# Patient Record
Sex: Female | Born: 1975 | Race: Black or African American | Hispanic: No | Marital: Married | Smoking: Never smoker
Health system: Southern US, Community
[De-identification: ages and names within clinical notes are randomized; demographics above are authoritative.]

## PROBLEM LIST (undated history)

## (undated) DIAGNOSIS — N189 Chronic kidney disease, unspecified: Secondary | ICD-10-CM

## (undated) DIAGNOSIS — Z8679 Personal history of other diseases of the circulatory system: Secondary | ICD-10-CM

## (undated) DIAGNOSIS — R0602 Shortness of breath: Secondary | ICD-10-CM

## (undated) DIAGNOSIS — I341 Nonrheumatic mitral (valve) prolapse: Secondary | ICD-10-CM

## (undated) DIAGNOSIS — I10 Essential (primary) hypertension: Secondary | ICD-10-CM

## (undated) DIAGNOSIS — R51 Headache: Secondary | ICD-10-CM

## (undated) DIAGNOSIS — K449 Diaphragmatic hernia without obstruction or gangrene: Secondary | ICD-10-CM

## (undated) DIAGNOSIS — J45909 Unspecified asthma, uncomplicated: Secondary | ICD-10-CM

## (undated) HISTORY — PX: CHOLECYSTECTOMY: SHX55

## (undated) HISTORY — PX: TUBAL LIGATION: SHX77

---

## 2000-02-17 ENCOUNTER — Encounter: Admission: RE | Admit: 2000-02-17 | Discharge: 2000-02-17 | Payer: Self-pay | Admitting: Sports Medicine

## 2000-06-01 ENCOUNTER — Encounter: Admission: RE | Admit: 2000-06-01 | Discharge: 2000-06-01 | Payer: Self-pay | Admitting: Sports Medicine

## 2000-07-27 ENCOUNTER — Encounter: Admission: RE | Admit: 2000-07-27 | Discharge: 2000-07-27 | Payer: Self-pay | Admitting: Family Medicine

## 2000-11-15 ENCOUNTER — Encounter: Admission: RE | Admit: 2000-11-15 | Discharge: 2000-11-15 | Payer: Self-pay | Admitting: Family Medicine

## 2000-11-29 ENCOUNTER — Encounter: Admission: RE | Admit: 2000-11-29 | Discharge: 2000-11-29 | Payer: Self-pay | Admitting: Family Medicine

## 2001-03-01 ENCOUNTER — Encounter: Admission: RE | Admit: 2001-03-01 | Discharge: 2001-03-01 | Payer: Self-pay | Admitting: Sports Medicine

## 2001-07-12 ENCOUNTER — Encounter: Admission: RE | Admit: 2001-07-12 | Discharge: 2001-07-12 | Payer: Self-pay | Admitting: Sports Medicine

## 2001-11-30 ENCOUNTER — Encounter: Admission: RE | Admit: 2001-11-30 | Discharge: 2001-11-30 | Payer: Self-pay | Admitting: Family Medicine

## 2001-12-20 ENCOUNTER — Encounter: Admission: RE | Admit: 2001-12-20 | Discharge: 2001-12-20 | Payer: Self-pay | Admitting: Sports Medicine

## 2002-01-03 ENCOUNTER — Encounter: Admission: RE | Admit: 2002-01-03 | Discharge: 2002-01-03 | Payer: Self-pay | Admitting: Sports Medicine

## 2002-01-03 ENCOUNTER — Encounter (INDEPENDENT_AMBULATORY_CARE_PROVIDER_SITE_OTHER): Payer: Self-pay | Admitting: Specialist

## 2002-02-12 ENCOUNTER — Encounter: Admission: RE | Admit: 2002-02-12 | Discharge: 2002-02-12 | Payer: Self-pay | Admitting: Family Medicine

## 2002-02-14 ENCOUNTER — Encounter: Admission: RE | Admit: 2002-02-14 | Discharge: 2002-02-14 | Payer: Self-pay | Admitting: Sports Medicine

## 2002-03-27 ENCOUNTER — Encounter: Admission: RE | Admit: 2002-03-27 | Discharge: 2002-03-27 | Payer: Self-pay | Admitting: Family Medicine

## 2002-04-11 ENCOUNTER — Encounter: Admission: RE | Admit: 2002-04-11 | Discharge: 2002-04-11 | Payer: Self-pay | Admitting: Sports Medicine

## 2002-07-18 ENCOUNTER — Encounter: Admission: RE | Admit: 2002-07-18 | Discharge: 2002-07-18 | Payer: Self-pay | Admitting: Sports Medicine

## 2002-08-14 ENCOUNTER — Encounter: Admission: RE | Admit: 2002-08-14 | Discharge: 2002-08-14 | Payer: Self-pay | Admitting: Family Medicine

## 2002-10-08 ENCOUNTER — Encounter: Admission: RE | Admit: 2002-10-08 | Discharge: 2002-10-08 | Payer: Self-pay | Admitting: Family Medicine

## 2002-11-26 ENCOUNTER — Encounter: Admission: RE | Admit: 2002-11-26 | Discharge: 2002-11-26 | Payer: Self-pay | Admitting: Family Medicine

## 2002-12-19 ENCOUNTER — Encounter: Admission: RE | Admit: 2002-12-19 | Discharge: 2002-12-19 | Payer: Self-pay | Admitting: Sports Medicine

## 2003-04-09 ENCOUNTER — Encounter: Admission: RE | Admit: 2003-04-09 | Discharge: 2003-04-09 | Payer: Self-pay | Admitting: Family Medicine

## 2003-04-09 ENCOUNTER — Encounter (INDEPENDENT_AMBULATORY_CARE_PROVIDER_SITE_OTHER): Payer: Self-pay | Admitting: Family Medicine

## 2003-06-18 ENCOUNTER — Encounter: Admission: RE | Admit: 2003-06-18 | Discharge: 2003-06-18 | Payer: Self-pay | Admitting: Family Medicine

## 2003-07-02 ENCOUNTER — Encounter: Admission: RE | Admit: 2003-07-02 | Discharge: 2003-07-02 | Payer: Self-pay | Admitting: Family Medicine

## 2003-07-30 ENCOUNTER — Encounter: Admission: RE | Admit: 2003-07-30 | Discharge: 2003-07-30 | Payer: Self-pay | Admitting: Family Medicine

## 2003-07-31 ENCOUNTER — Encounter: Admission: RE | Admit: 2003-07-31 | Discharge: 2003-07-31 | Payer: Self-pay | Admitting: Sports Medicine

## 2003-11-12 ENCOUNTER — Encounter (INDEPENDENT_AMBULATORY_CARE_PROVIDER_SITE_OTHER): Payer: Self-pay | Admitting: *Deleted

## 2003-11-12 ENCOUNTER — Encounter: Admission: RE | Admit: 2003-11-12 | Discharge: 2003-11-12 | Payer: Self-pay | Admitting: Family Medicine

## 2004-04-07 ENCOUNTER — Ambulatory Visit: Payer: Self-pay | Admitting: Family Medicine

## 2004-07-06 ENCOUNTER — Ambulatory Visit: Payer: Self-pay | Admitting: Sports Medicine

## 2004-07-28 ENCOUNTER — Ambulatory Visit: Payer: Self-pay | Admitting: Family Medicine

## 2004-07-28 ENCOUNTER — Encounter (INDEPENDENT_AMBULATORY_CARE_PROVIDER_SITE_OTHER): Payer: Self-pay | Admitting: *Deleted

## 2004-11-02 ENCOUNTER — Ambulatory Visit: Payer: Self-pay | Admitting: Sports Medicine

## 2004-11-29 ENCOUNTER — Ambulatory Visit: Payer: Self-pay | Admitting: Family Medicine

## 2005-01-05 ENCOUNTER — Ambulatory Visit: Payer: Self-pay | Admitting: Family Medicine

## 2005-01-05 ENCOUNTER — Encounter (INDEPENDENT_AMBULATORY_CARE_PROVIDER_SITE_OTHER): Payer: Self-pay | Admitting: Family Medicine

## 2005-02-22 ENCOUNTER — Emergency Department (HOSPITAL_COMMUNITY): Admission: EM | Admit: 2005-02-22 | Discharge: 2005-02-22 | Payer: Self-pay | Admitting: Emergency Medicine

## 2005-03-02 ENCOUNTER — Ambulatory Visit: Payer: Self-pay | Admitting: Family Medicine

## 2005-06-01 ENCOUNTER — Ambulatory Visit: Payer: Self-pay | Admitting: Family Medicine

## 2005-08-30 ENCOUNTER — Ambulatory Visit: Payer: Self-pay | Admitting: Family Medicine

## 2005-09-07 ENCOUNTER — Ambulatory Visit: Payer: Self-pay | Admitting: Family Medicine

## 2005-09-08 ENCOUNTER — Encounter (INDEPENDENT_AMBULATORY_CARE_PROVIDER_SITE_OTHER): Payer: Self-pay | Admitting: Family Medicine

## 2005-09-12 ENCOUNTER — Encounter (INDEPENDENT_AMBULATORY_CARE_PROVIDER_SITE_OTHER): Payer: Self-pay | Admitting: *Deleted

## 2005-09-12 LAB — CONVERTED CEMR LAB

## 2005-10-19 ENCOUNTER — Ambulatory Visit: Payer: Self-pay | Admitting: Family Medicine

## 2005-11-28 ENCOUNTER — Ambulatory Visit: Payer: Self-pay | Admitting: Family Medicine

## 2005-12-21 ENCOUNTER — Ambulatory Visit: Payer: Self-pay | Admitting: Family Medicine

## 2006-01-05 ENCOUNTER — Encounter: Admission: RE | Admit: 2006-01-05 | Discharge: 2006-01-05 | Payer: Self-pay | Admitting: Sports Medicine

## 2006-02-01 ENCOUNTER — Ambulatory Visit: Payer: Self-pay | Admitting: Family Medicine

## 2006-07-07 ENCOUNTER — Encounter (INDEPENDENT_AMBULATORY_CARE_PROVIDER_SITE_OTHER): Payer: Self-pay | Admitting: *Deleted

## 2006-08-04 ENCOUNTER — Other Ambulatory Visit: Admission: RE | Admit: 2006-08-04 | Discharge: 2006-08-04 | Payer: Self-pay | Admitting: Obstetrics & Gynecology

## 2006-10-25 ENCOUNTER — Encounter: Payer: Self-pay | Admitting: Family Medicine

## 2006-12-29 ENCOUNTER — Encounter: Payer: Self-pay | Admitting: Family Medicine

## 2007-02-24 ENCOUNTER — Inpatient Hospital Stay (HOSPITAL_COMMUNITY): Admission: AD | Admit: 2007-02-24 | Discharge: 2007-02-25 | Payer: Self-pay | Admitting: Obstetrics and Gynecology

## 2007-03-24 ENCOUNTER — Inpatient Hospital Stay (HOSPITAL_COMMUNITY): Admission: AD | Admit: 2007-03-24 | Discharge: 2007-03-27 | Payer: Self-pay | Admitting: Obstetrics and Gynecology

## 2007-03-30 ENCOUNTER — Ambulatory Visit (HOSPITAL_COMMUNITY): Admission: RE | Admit: 2007-03-30 | Discharge: 2007-03-30 | Payer: Self-pay | Admitting: Obstetrics and Gynecology

## 2008-05-09 HISTORY — PX: AXILLARY LYMPH NODE BIOPSY: SHX5737

## 2009-02-17 ENCOUNTER — Encounter: Admission: RE | Admit: 2009-02-17 | Discharge: 2009-02-17 | Payer: Self-pay | Admitting: Surgery

## 2009-03-09 ENCOUNTER — Encounter (INDEPENDENT_AMBULATORY_CARE_PROVIDER_SITE_OTHER): Payer: Self-pay | Admitting: Surgery

## 2009-03-09 ENCOUNTER — Ambulatory Visit (HOSPITAL_COMMUNITY): Admission: RE | Admit: 2009-03-09 | Discharge: 2009-03-09 | Payer: Self-pay | Admitting: Surgery

## 2009-11-04 ENCOUNTER — Inpatient Hospital Stay (HOSPITAL_COMMUNITY): Admission: AD | Admit: 2009-11-04 | Discharge: 2009-11-04 | Payer: Self-pay | Admitting: Obstetrics and Gynecology

## 2010-01-20 ENCOUNTER — Inpatient Hospital Stay (HOSPITAL_COMMUNITY)
Admission: AD | Admit: 2010-01-20 | Discharge: 2010-01-23 | Payer: Self-pay | Source: Home / Self Care | Admitting: Obstetrics and Gynecology

## 2010-01-21 ENCOUNTER — Encounter (INDEPENDENT_AMBULATORY_CARE_PROVIDER_SITE_OTHER): Payer: Self-pay | Admitting: Obstetrics and Gynecology

## 2010-01-26 ENCOUNTER — Ambulatory Visit
Admission: RE | Admit: 2010-01-26 | Discharge: 2010-01-26 | Payer: Self-pay | Source: Home / Self Care | Admitting: Obstetrics and Gynecology

## 2010-02-04 ENCOUNTER — Ambulatory Visit
Admission: RE | Admit: 2010-02-04 | Discharge: 2010-02-04 | Payer: Self-pay | Source: Home / Self Care | Admitting: Obstetrics and Gynecology

## 2010-05-30 ENCOUNTER — Encounter: Payer: Self-pay | Admitting: Family Medicine

## 2010-05-30 ENCOUNTER — Encounter: Payer: Self-pay | Admitting: Sports Medicine

## 2010-07-22 LAB — COMPREHENSIVE METABOLIC PANEL
AST: 20 U/L (ref 0–37)
Albumin: 2.9 g/dL — ABNORMAL LOW (ref 3.5–5.2)
Albumin: 3.1 g/dL — ABNORMAL LOW (ref 3.5–5.2)
Alkaline Phosphatase: 196 U/L — ABNORMAL HIGH (ref 39–117)
BUN: 10 mg/dL (ref 6–23)
Chloride: 108 mEq/L (ref 96–112)
Creatinine, Ser: 1.16 mg/dL (ref 0.4–1.2)
GFR calc non Af Amer: 54 mL/min — ABNORMAL LOW (ref >60)
GFR calc non Af Amer: 49 mL/min — ABNORMAL LOW (ref >60)
Glucose, Bld: 82 mg/dL (ref 70–99)
Potassium: 3.6 mEq/L (ref 3.5–5.1)
Sodium: 134 mEq/L — ABNORMAL LOW (ref 135–145)
Total Protein: 5.8 g/dL — ABNORMAL LOW (ref 6.0–8.3)
Total Protein: 5.9 g/dL — ABNORMAL LOW (ref 6.0–8.3)

## 2010-07-22 LAB — RPR: RPR Ser Ql: NONREACTIVE

## 2010-07-22 LAB — CBC
Hemoglobin: 8 g/dL — ABNORMAL LOW (ref 12.0–15.0)
Hemoglobin: 9.4 g/dL — ABNORMAL LOW (ref 12.0–15.0)
MCH: 31.6 pg (ref 26.0–34.0)
MCH: 32.2 pg (ref 26.0–34.0)
MCH: 31.5 pg (ref 26.0–34.0)
MCHC: 33.9 g/dL (ref 30.0–36.0)
MCV: 94.1 fL (ref 78.0–100.0)
MCV: 92.8 fL (ref 78.0–100.0)
Platelets: 194 10*3/uL (ref 150–400)
Platelets: 221 10*3/uL (ref 150–400)
RBC: 2.94 MIL/uL — ABNORMAL LOW (ref 3.87–5.11)
RDW: 18.5 % — ABNORMAL HIGH (ref 11.5–15.5)
RDW: 18.5 % — ABNORMAL HIGH (ref 11.5–15.5)
WBC: 6 10*3/uL (ref 4.0–10.5)

## 2010-07-22 LAB — LACTATE DEHYDROGENASE
LDH: 147 U/L (ref 94–250)
LDH: 305 U/L — ABNORMAL HIGH (ref 94–250)

## 2010-07-22 LAB — URIC ACID: Uric Acid, Serum: 8.7 mg/dL — ABNORMAL HIGH (ref 2.4–7.0)

## 2010-07-25 LAB — LACTATE DEHYDROGENASE: LDH: 119 U/L (ref 94–250)

## 2010-07-25 LAB — COMPREHENSIVE METABOLIC PANEL
AST: 22 U/L (ref 0–37)
Albumin: 3.1 g/dL — ABNORMAL LOW (ref 3.5–5.2)
CO2: 24 mEq/L (ref 19–32)
Chloride: 106 mEq/L (ref 96–112)
GFR calc Af Amer: >60 mL/min (ref >60)
GFR calc non Af Amer: >60 mL/min (ref >60)
Glucose, Bld: 82 mg/dL (ref 70–99)

## 2010-07-25 LAB — CBC
Hemoglobin: 9.1 g/dL — ABNORMAL LOW (ref 12.0–15.0)
MCHC: 34 g/dL (ref 30.0–36.0)
MCV: 91.4 fL (ref 78.0–100.0)

## 2010-07-25 LAB — URINALYSIS, ROUTINE W REFLEX MICROSCOPIC
Glucose, UA: 100 mg/dL — AB
Leukocytes, UA: NEGATIVE
Specific Gravity, Urine: >1.03 — ABNORMAL HIGH (ref 1.005–1.030)
Urobilinogen, UA: 1 mg/dL (ref 0.0–1.0)
pH: 6 (ref 5.0–8.0)

## 2010-07-25 LAB — URINE MICROSCOPIC-ADD ON

## 2010-08-12 LAB — CBC
HCT: 36.1 % (ref 36.0–46.0)
MCHC: 34.4 g/dL (ref 30.0–36.0)
MCV: 90.5 fL (ref 78.0–100.0)
RBC: 3.99 MIL/uL (ref 3.87–5.11)
WBC: 5.7 10*3/uL (ref 4.0–10.5)

## 2010-08-12 LAB — BASIC METABOLIC PANEL
BUN: 26 mg/dL — ABNORMAL HIGH (ref 6–23)
CO2: 25 mEq/L (ref 19–32)
Chloride: 107 mEq/L (ref 96–112)
Creatinine, Ser: 1.35 mg/dL — ABNORMAL HIGH (ref 0.4–1.2)
Potassium: 4 mEq/L (ref 3.5–5.1)

## 2010-08-12 LAB — DIFFERENTIAL
Basophils Relative: 1 % (ref 0–1)
Eosinophils Absolute: 0.1 10*3/uL (ref 0.0–0.7)
Eosinophils Relative: 2 % (ref 0–5)
Lymphs Abs: 1.6 10*3/uL (ref 0.7–4.0)
Monocytes Relative: 7 % (ref 3–12)
Neutrophils Relative %: 63 % (ref 43–77)

## 2011-02-15 LAB — CBC
HCT: 27.7 — ABNORMAL LOW
Hemoglobin: 8.4 — ABNORMAL LOW
MCHC: 34.2
MCHC: 35.9
MCV: 94.5
Platelets: 186
Platelets: 193
RDW: 14.4
RDW: 14.6
WBC: 7.4

## 2011-02-15 LAB — RPR: RPR Ser Ql: NONREACTIVE

## 2011-02-16 LAB — COMPREHENSIVE METABOLIC PANEL
ALT: 22
AST: 31
Alkaline Phosphatase: 105
CO2: 19
Calcium: 9.3
Chloride: 105
GFR calc Af Amer: >60
GFR calc non Af Amer: >60
Glucose, Bld: 81
Potassium: 3.6
Sodium: 134 — ABNORMAL LOW

## 2011-02-16 LAB — CREATININE CLEARANCE, URINE, 24 HOUR
Creatinine Clearance: 108
Creatinine, 24H Ur: 1513
Creatinine: 0.97
Urine Total Volume-CRCL: 1950

## 2011-02-16 LAB — CBC
Hemoglobin: 10.5 — ABNORMAL LOW
MCHC: 34.6
RBC: 3.21 — ABNORMAL LOW
WBC: 11.3 — ABNORMAL HIGH

## 2011-02-16 LAB — PROTEIN, URINE, 24 HOUR: Collection Interval-UPROT: 24

## 2011-02-16 LAB — LACTATE DEHYDROGENASE: LDH: 160

## 2012-01-28 ENCOUNTER — Ambulatory Visit (HOSPITAL_COMMUNITY)
Admission: RE | Admit: 2012-01-28 | Discharge: 2012-01-28 | Disposition: A | Discharge status: Home / Self Care | Source: Ambulatory Visit | Attending: *Deleted | Admitting: *Deleted

## 2012-01-28 ENCOUNTER — Encounter (HOSPITAL_COMMUNITY): Payer: Self-pay

## 2012-01-28 ENCOUNTER — Other Ambulatory Visit (HOSPITAL_COMMUNITY): Payer: Self-pay | Admitting: *Deleted

## 2012-01-28 DIAGNOSIS — R0602 Shortness of breath: Secondary | ICD-10-CM | POA: Insufficient documentation

## 2012-01-28 DIAGNOSIS — R7989 Other specified abnormal findings of blood chemistry: Secondary | ICD-10-CM

## 2012-01-28 DIAGNOSIS — R079 Chest pain, unspecified: Secondary | ICD-10-CM | POA: Insufficient documentation

## 2012-01-28 LAB — CREATININE, SERUM
Creatinine, Ser: 1.36 mg/dL — ABNORMAL HIGH (ref 0.50–1.10)
GFR calc Af Amer: 58 mL/min — ABNORMAL LOW (ref >90)

## 2012-01-28 LAB — BUN: BUN: 29 mg/dL — ABNORMAL HIGH (ref 6–23)

## 2012-01-28 MED ORDER — IOHEXOL 350 MG/ML SOLN
100.0000 mL | Freq: Once | INTRAVENOUS | Status: AC | PRN
  Administered 2012-01-28: 100 mL via INTRAVENOUS

## 2012-08-30 ENCOUNTER — Emergency Department (HOSPITAL_COMMUNITY): Payer: BC Managed Care – PPO

## 2012-08-30 ENCOUNTER — Emergency Department (HOSPITAL_COMMUNITY)
Admission: EM | Admit: 2012-08-30 | Discharge: 2012-08-30 | Disposition: A | Discharge status: Home / Self Care | Payer: BC Managed Care – PPO | Attending: Emergency Medicine | Admitting: Emergency Medicine

## 2012-08-30 ENCOUNTER — Encounter (HOSPITAL_COMMUNITY): Payer: Self-pay | Admitting: Emergency Medicine

## 2012-08-30 DIAGNOSIS — I10 Essential (primary) hypertension: Secondary | ICD-10-CM | POA: Insufficient documentation

## 2012-08-30 DIAGNOSIS — Z8639 Personal history of other endocrine, nutritional and metabolic disease: Secondary | ICD-10-CM | POA: Insufficient documentation

## 2012-08-30 DIAGNOSIS — Z862 Personal history of diseases of the blood and blood-forming organs and certain disorders involving the immune mechanism: Secondary | ICD-10-CM | POA: Insufficient documentation

## 2012-08-30 DIAGNOSIS — J45909 Unspecified asthma, uncomplicated: Secondary | ICD-10-CM | POA: Insufficient documentation

## 2012-08-30 DIAGNOSIS — R1013 Epigastric pain: Secondary | ICD-10-CM | POA: Insufficient documentation

## 2012-08-30 HISTORY — DX: Essential (primary) hypertension: I10

## 2012-08-30 HISTORY — DX: Unspecified asthma, uncomplicated: J45.909

## 2012-08-30 HISTORY — DX: Unspecified porphyria: E80.20

## 2012-08-30 LAB — COMPREHENSIVE METABOLIC PANEL
ALT: 15 U/L (ref 0–35)
AST: 21 U/L (ref 0–37)
Albumin: 4 g/dL (ref 3.5–5.2)
Alkaline Phosphatase: 40 U/L (ref 39–117)
BUN: 24 mg/dL — ABNORMAL HIGH (ref 6–23)
CO2: 24 mEq/L (ref 19–32)
Calcium: 9.8 mg/dL (ref 8.4–10.5)
Chloride: 103 mEq/L (ref 96–112)
Creatinine, Ser: 1.33 mg/dL — ABNORMAL HIGH (ref 0.50–1.10)
GFR calc Af Amer: 59 mL/min — ABNORMAL LOW (ref >90)
GFR calc non Af Amer: 51 mL/min — ABNORMAL LOW (ref >90)
Glucose, Bld: 84 mg/dL (ref 70–99)
Potassium: 3.8 mEq/L (ref 3.5–5.1)
Sodium: 136 mEq/L (ref 135–145)
Total Bilirubin: 0.4 mg/dL (ref 0.3–1.2)
Total Protein: 7.5 g/dL (ref 6.0–8.3)

## 2012-08-30 LAB — CBC
HCT: 33.1 % — ABNORMAL LOW (ref 36.0–46.0)
Hemoglobin: 10.9 g/dL — ABNORMAL LOW (ref 12.0–15.0)
MCH: 28.1 pg (ref 26.0–34.0)
MCHC: 32.9 g/dL (ref 30.0–36.0)
MCV: 85.3 fL (ref 78.0–100.0)
Platelets: 260 10*3/uL (ref 150–400)
RBC: 3.88 MIL/uL (ref 3.87–5.11)
RDW: 14.1 % (ref 11.5–15.5)
WBC: 7.4 10*3/uL (ref 4.0–10.5)

## 2012-08-30 LAB — URINALYSIS, ROUTINE W REFLEX MICROSCOPIC
Bilirubin Urine: NEGATIVE
Glucose, UA: NEGATIVE mg/dL
Hgb urine dipstick: NEGATIVE
Ketones, ur: NEGATIVE mg/dL
Leukocytes, UA: NEGATIVE
Nitrite: NEGATIVE
Protein, ur: NEGATIVE mg/dL
Specific Gravity, Urine: 1.015 (ref 1.005–1.030)
Urobilinogen, UA: 0.2 mg/dL (ref 0.0–1.0)
pH: 6 (ref 5.0–8.0)

## 2012-08-30 LAB — LIPASE, BLOOD: Lipase: 51 U/L (ref 11–59)

## 2012-08-30 MED ORDER — FAMOTIDINE 20 MG PO TABS: 20.0000 mg | ORAL_TABLET | Freq: Two times a day (BID) | ORAL | Status: DC

## 2012-08-30 MED ORDER — SODIUM CHLORIDE 0.9 % IV BOLUS (SEPSIS)
1000.0000 mL | Freq: Once | INTRAVENOUS | Status: AC
  Administered 2012-08-30: 1000 mL via INTRAVENOUS

## 2012-08-30 MED ORDER — GI COCKTAIL ~~LOC~~
30.0000 mL | Freq: Once | ORAL | Status: AC
  Administered 2012-08-30: 30 mL via ORAL
  Filled 2012-08-30: qty 30

## 2012-08-30 NOTE — ED Notes (Signed)
Patient transported to X-ray 

## 2012-08-30 NOTE — ED Notes (Addendum)
Pt here for c/o chest pain that started last night while sleeping 9/10 associated nausea, sob denies sweating

## 2012-08-30 NOTE — ED Provider Notes (Signed)
History     37 year old female with lower sternal/epigastric pain. Gradual onset last night. Relatively constant since. No appreciable exacerbating relieving factors. Mild nausea. No vomiting. Shortness of breath. No fevers or chills. No palpitations. No urinary complaints. No history similar pain. 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  CSN: [id number]  Arrival date & time 08/30/12  0759   First MD Initiated Contact with Patient 08/30/12 [phone]      No chief complaint on file.   (Consider location/radiation/quality/duration/timing/severity/associated sxs/prior treatment) HPI  Past Medical History  Diagnosis Date  . Hypertension   . Porphyria   . Asthma     No past surgical history on file.  No family history on file.  History  Substance Use Topics  . Smoking status: Never Smoker   . Smokeless tobacco: Not on file  . Alcohol Use: No    OB History   Grav Para Term Preterm Abortions TAB SAB Ect Mult Living                  Review of Systems  All systems reviewed and negative, other than as noted in HPI.   Allergies  Sulfa antibiotics  Home Medications  No current outpatient prescriptions on file.  LMP 08/07/2012  Physical Exam  Nursing note and vitals reviewed. Constitutional: She appears well-developed and well-nourished. No distress.  HENT:  Head: Normocephalic and atraumatic.  Eyes: Conjunctivae are normal. Right eye exhibits no discharge. Left eye exhibits no discharge.  Neck: Neck supple.  Cardiovascular: Normal rate, regular rhythm and normal heart sounds.  Exam reveals no gallop and no friction rub.   No murmur heard. Pulmonary/Chest: Effort normal and breath sounds normal. No respiratory distress.  Abdominal: Soft. She exhibits no distension. There is tenderness.  Mild epigastric tenderness w/o rebound out guarding   Musculoskeletal: She exhibits no edema and no tenderness.  Neurological: She is alert.  Skin: Skin is warm and dry.  Psychiatric: She has a normal mood and affect. Her behavior is normal. Thought content normal.    ED Course  Procedures (including critical care time)  Labs Reviewed  CBC - Abnormal; Notable for the following:    Hemoglobin 10.9 (*)    HCT 33.1 (*)    All other components within normal limits  COMPREHENSIVE METABOLIC PANEL - Abnormal; Notable for the following:    BUN 24 (*)    Creatinine, Ser 1.33 (*)    GFR calc non Af Amer 51 (*)    GFR calc Af Amer 59 (*)    All other components within normal limits  LIPASE, BLOOD  URINALYSIS, ROUTINE W REFLEX MICROSCOPIC   No results found.  EKG:  Rhythm: normal sinus Vent. rate 72 BPM PR interval 132 ms QRS duration 82 ms QT/QTc 380/416 ms ST segments: normal Comparison: none   1. Epigastric pain     37yF with lower sternal/epigastric pain. Suspect GI etiology. Doubt ACS. Pt with hx of acute intermittent porphyria but location of pain different than pain she has experienced with this previously. No neurological complaints. Pt alert and acting appropriately. CXR clear. Afebrile and no respiratory distress on exam. Low risk for PE based on Well's criteria. Plan course of pepcid. OUtpt FU.      Raeford Razor, MD 09/03/12 [phone]

## 2013-05-20 ENCOUNTER — Encounter (HOSPITAL_COMMUNITY): Payer: Self-pay | Admitting: Emergency Medicine

## 2013-05-20 ENCOUNTER — Emergency Department (HOSPITAL_COMMUNITY)
Admission: EM | Admit: 2013-05-20 | Discharge: 2013-05-20 | Disposition: A | Discharge status: Home / Self Care | Payer: BC Managed Care – PPO | Attending: Emergency Medicine | Admitting: Emergency Medicine

## 2013-05-20 DIAGNOSIS — J45909 Unspecified asthma, uncomplicated: Secondary | ICD-10-CM | POA: Insufficient documentation

## 2013-05-20 DIAGNOSIS — R1012 Left upper quadrant pain: Secondary | ICD-10-CM | POA: Insufficient documentation

## 2013-05-20 DIAGNOSIS — Z8719 Personal history of other diseases of the digestive system: Secondary | ICD-10-CM | POA: Insufficient documentation

## 2013-05-20 DIAGNOSIS — Z79899 Other long term (current) drug therapy: Secondary | ICD-10-CM | POA: Insufficient documentation

## 2013-05-20 DIAGNOSIS — Z862 Personal history of diseases of the blood and blood-forming organs and certain disorders involving the immune mechanism: Secondary | ICD-10-CM | POA: Insufficient documentation

## 2013-05-20 DIAGNOSIS — Z8639 Personal history of other endocrine, nutritional and metabolic disease: Secondary | ICD-10-CM | POA: Insufficient documentation

## 2013-05-20 DIAGNOSIS — R109 Unspecified abdominal pain: Secondary | ICD-10-CM

## 2013-05-20 DIAGNOSIS — Z8679 Personal history of other diseases of the circulatory system: Secondary | ICD-10-CM | POA: Insufficient documentation

## 2013-05-20 DIAGNOSIS — IMO0002 Reserved for concepts with insufficient information to code with codable children: Secondary | ICD-10-CM | POA: Insufficient documentation

## 2013-05-20 DIAGNOSIS — I10 Essential (primary) hypertension: Secondary | ICD-10-CM | POA: Insufficient documentation

## 2013-05-20 HISTORY — DX: Nonrheumatic mitral (valve) prolapse: I34.1

## 2013-05-20 HISTORY — DX: Diaphragmatic hernia without obstruction or gangrene: K44.9

## 2013-05-20 MED ORDER — OMEPRAZOLE 20 MG PO CPDR: 20.0000 mg | DELAYED_RELEASE_CAPSULE | Freq: Every day | ORAL | Status: DC

## 2013-05-20 NOTE — ED Notes (Signed)
Pt c/o pain under her left rib cage since 1000 this morning, pt also c/o left arm pain, headache that started after pain in rib cage.

## 2013-05-20 NOTE — ED Provider Notes (Signed)
CSN: [id number]     Arrival date & time 05/20/13  1231 History   First MD Initiated Contact with Patient 05/20/13 1305     Chief Complaint  Patient presents with  . Abdominal Pain    upper    HPI Patient reports sharp stabbing left upper quadrant abdominal pain that began today.  She states her pain and discomfort is easing up.  At some point during this episode of pain she also developed some pain in her left chest with radiation towards her left arm.  She had this happen one other time before which point she was told she had gastritis and was placed on Pepcid.  She states improvement with her symptoms over several days and she never followed up with GI.  She's been otherwise in her normal state of health.  She has no prior history of early cardiac disease.  No family history of early heart disease.  No urinary complaints.  Last normal bowel movement was 2 days ago.  She denies melena or hematochezia.   Past Medical History  Diagnosis Date  . Hypertension   . Porphyria   . Asthma   . Hiatal hernia   . Mitral valve prolapse    History reviewed. No pertinent past surgical history. No family history on file. History  Substance Use Topics  . Smoking status: Never Smoker   . Smokeless tobacco: Not on file  . Alcohol Use: No   OB History   Grav Para Term Preterm Abortions TAB SAB Ect Mult Living                 Review of Systems  All other systems reviewed and are negative.    Allergies  Sulfa antibiotics  Home Medications   Current Outpatient Rx  Name  Route  Sig  Dispense  Refill  . acetaminophen (TYLENOL) 500 MG tablet   Oral   Take 500-1,000 mg by mouth every 8 (eight) hours as needed for moderate pain or headache.         . albuterol (PROVENTIL HFA;VENTOLIN HFA) 108 (90 BASE) MCG/ACT inhaler   Inhalation   Inhale 2 puffs into the lungs every 6 (six) hours as needed for wheezing or shortness of breath.         Marland Kitchen atenolol (TENORMIN) 25 MG tablet   Oral   Take  25 mg by mouth daily.         . beclomethasone (QVAR) 40 MCG/ACT inhaler   Inhalation   Inhale 2 puffs into the lungs 2 (two) times daily.         Marland Kitchen lisinopril-hydrochlorothiazide (PRINZIDE,ZESTORETIC) 20-12.5 MG per tablet   Oral   Take 1 tablet by mouth daily.         Marland Kitchen omeprazole (PRILOSEC) 20 MG capsule   Oral   Take 1 capsule (20 mg total) by mouth daily.   30 capsule   0    BP 138/91  Pulse 64  Temp(Src) 98 F (36.7 C) (Oral)  Resp 15  SpO2 100%  LMP 04/10/2013 Physical Exam  Nursing note and vitals reviewed. Constitutional: She is oriented to person, place, and time. She appears well-developed and well-nourished. No distress.  HENT:  Head: Normocephalic and atraumatic.  Eyes: EOM are normal.  Neck: Normal range of motion.  Cardiovascular: Normal rate, regular rhythm and normal heart sounds.   Pulmonary/Chest: Effort normal and breath sounds normal.  Abdominal: Soft. She exhibits no distension. There is no tenderness.  Musculoskeletal: Normal range  of motion.  Neurological: She is alert and oriented to person, place, and time.  Skin: Skin is warm and dry.  Psychiatric: She has a normal mood and affect. Judgment normal.    ED Course  Procedures (including critical care time) Labs Review Labs Reviewed - No data to display Imaging Review No results found.  EKG Interpretation    Date/Time:  Monday May 20 2013 14:48:22 EST Ventricular Rate:  59 PR Interval:  138 QRS Duration: 78 QT Interval:  428 QTC Calculation: 424 R Axis:   66 Text Interpretation:  Sinus rhythm early repolarization pattern No significant change was found Confirmed by Zenobia Kuennen  MD, Alejandria Wessells ([id number]) on 05/20/2013 2:57:13 PM            MDM   1. Abdominal pain    Nonspecific left upper quadrant abdominal pain.  No guarding or rebound.  Labs and imaging unlikely to assist in the workup.  Patient's pain is improving are ready.  Outpatient GI followup he will placed on Prilosec.   Doubt ACS.  EKG normal.  no cardiac risk factors.    Hoy Morn, MD 05/20/13 [phone]

## 2013-06-12 ENCOUNTER — Other Ambulatory Visit: Payer: Self-pay | Admitting: Gastroenterology

## 2013-07-15 ENCOUNTER — Other Ambulatory Visit: Payer: Self-pay | Admitting: Gastroenterology

## 2013-07-15 DIAGNOSIS — R109 Unspecified abdominal pain: Secondary | ICD-10-CM

## 2013-07-19 ENCOUNTER — Ambulatory Visit
Admission: RE | Admit: 2013-07-19 | Discharge: 2013-07-19 | Disposition: A | Discharge status: Home / Self Care | Payer: BC Managed Care – PPO | Source: Ambulatory Visit | Attending: Gastroenterology | Admitting: Gastroenterology

## 2013-07-19 DIAGNOSIS — R109 Unspecified abdominal pain: Secondary | ICD-10-CM

## 2013-07-31 ENCOUNTER — Ambulatory Visit (INDEPENDENT_AMBULATORY_CARE_PROVIDER_SITE_OTHER): Payer: BC Managed Care – PPO | Admitting: Surgery

## 2013-07-31 ENCOUNTER — Encounter (INDEPENDENT_AMBULATORY_CARE_PROVIDER_SITE_OTHER): Payer: Self-pay | Admitting: Surgery

## 2013-07-31 VITALS — BP 122/82 | HR 75 | Temp 97.2°F | Ht 65.0 in | Wt 122.0 lb

## 2013-07-31 DIAGNOSIS — K2950 Unspecified chronic gastritis without bleeding: Secondary | ICD-10-CM

## 2013-07-31 DIAGNOSIS — K2940 Chronic atrophic gastritis without bleeding: Secondary | ICD-10-CM

## 2013-07-31 DIAGNOSIS — K8010 Calculus of gallbladder with chronic cholecystitis without obstruction: Secondary | ICD-10-CM | POA: Insufficient documentation

## 2013-07-31 NOTE — Patient Instructions (Signed)
Please consider the recommendations that we have given you today:  Consider removal of your gallbladder to treat your symptoms of chronic biliary colic/chronic cholecystitis.  Improve your constipation with a more aggressive fiber bowel regimen.  See the Handout(s) we have given you.  Please call our office at [phone] if you wish to schedule surgery or if you have further questions / concerns.   Cholecystitis Cholecystitis is an inflammation of your gallbladder. It is usually caused by a buildup of gallstones or sludge (cholelithiasis) in your gallbladder. The gallbladder stores a fluid that helps digest fats (bile). Cholecystitis is serious and needs treatment right away.  CAUSES   Gallstones. Gallstones can block the tube that leads to your gallbladder, causing bile to build up. As bile builds up, the gallbladder becomes inflamed.  Bile duct problems, such as blockage from scarring or kinking.  Tumors. Tumors can stop bile from leaving your gallbladder correctly, causing bile to build up. As bile builds up, the gallbladder becomes inflamed. SYMPTOMS   Nausea.  Vomiting.  Abdominal pain, especially in the upper right area of your abdomen.  Abdominal tenderness or bloating.  Sweating.  Chills.  Fever.  Yellowing of the skin and the whites of the eyes (jaundice). DIAGNOSIS  Your caregiver may order blood tests to look for infection or gallbladder problems. Your caregiver may also order imaging tests, such as an ultrasound or computed tomography (CT) scan. Further tests may include a hepatobiliary iminodiacetic acid (HIDA) scan. This scan allows your caregiver to see your bile move from the liver to the gallbladder and to the small intestine. TREATMENT  A hospital stay is usually necessary to lessen the inflammation of your gallbladder. You may be required to not eat or drink (fast) for a certain amount of time. You may be given medicine to treat pain or an antibiotic  medicine to treat an infection. Surgery may be needed to remove your gallbladder (cholecystectomy) once the inflammation has gone down. Surgery may be needed right away if you develop complications such as death of gallbladder tissue (gangrene) or a tear (perforation) of the gallbladder.  [place] care will depend on your treatment. In general:  If you were given antibiotics, take them as directed. Finish them even if you start to feel better.  Only take over-the-counter or prescription medicines for pain, discomfort, or fever as directed by your caregiver.  Follow a low-fat diet until you see your caregiver again.  Keep all follow-up visits as directed by your caregiver. SEEK IMMEDIATE MEDICAL CARE IF:   Your pain is increasing and not controlled by medicines.  Your pain moves to another part of your abdomen or to your back.  You have a fever.  You have nausea and vomiting. MAKE SURE YOU:  Understand these instructions.  Will watch your condition.  Will get help right away if you are not doing well or get worse. Document Released: 04/25/2005 Document Revised: 07/18/2011 Document Reviewed: 03/11/2011 [facility] Patient Information 2014 [place], [place].  LAPAROSCOPIC SURGERY: POST OP INSTRUCTIONS  1. DIET: Follow a light bland diet the first 24 hours after arrival home, such as soup, liquids, crackers, etc.  Be sure to include lots of fluids daily.  Avoid fast food or heavy meals as your are more likely to get nauseated.  Eat a low fat the next few days after surgery.   2. Take your usually prescribed home medications unless otherwise directed. 3. PAIN CONTROL: a. Pain is best controlled by a  usual combination of three different methods TOGETHER: i. Ice/Heat ii. Over the counter pain medication iii. Prescription pain medication b. Most patients will experience some swelling and bruising around the incisions.  Ice packs or heating pads (30-60 minutes up to 6  times a day) will help. Use ice for the first few days to help decrease swelling and bruising, then switch to heat to help relax tight/sore spots and speed recovery.  Some people prefer to use ice alone, heat alone, alternating between ice & heat.  Experiment to what works for you.  Swelling and bruising can take several weeks to resolve.   c. It is helpful to take an over-the-counter pain medication regularly for the first few weeks.  Choose one of the following that works best for you: i. Naproxen (Aleve, etc)  Two 220mg  tabs twice a day ii. Ibuprofen (Advil, etc) Three 200mg  tabs four times a day (every meal & bedtime) iii. Acetaminophen (Tylenol, etc) 500-650mg  four times a day (every meal & bedtime) d. A  prescription for pain medication (such as oxycodone, hydrocodone, etc) should be given to you upon discharge.  Take your pain medication as prescribed.  i. If you are having problems/concerns with the prescription medicine (does not control pain, nausea, vomiting, rash, itching, etc), please call us [phone] to see if we need to switch you to a different pain medicine that will work better for you and/or control your side effect better. ii. If you need a refill on your pain medication, please contact your pharmacy.  They will contact our office to request authorization. Prescriptions will not be filled after 5 pm or on week-ends. 4. Avoid getting constipated.  Between the surgery and the pain medications, it is common to experience some constipation.  Increasing fluid intake and taking a fiber supplement (such as Metamucil, Citrucel, FiberCon, MiraLax, etc) 1-2 times a day regularly will usually help prevent this problem from occurring.  A mild laxative (prune juice, Milk of Magnesia, MiraLax, etc) should be taken according to package directions if there are no bowel movements after 48 hours.   5. Watch out for diarrhea.  If you have many loose bowel movements, simplify your diet to bland foods  & liquids for a few days.  Stop any stool softeners and decrease your fiber supplement.  Switching to mild anti-diarrheal medications (Kayopectate, Pepto Bismol) can help.  If this worsens or does not improve, please call us. 6. Wash / shower every day.  You may shower over the dressings as they are waterproof.  Continue to shower over incision(s) after the dressing is off. 7. Remove your waterproof bandages 5 days after surgery.  You may leave the incision open to air.  You may replace a dressing/Band-Aid to cover the incision for comfort if you wish.  8. ACTIVITIES as tolerated:   a. You may resume regular (light) daily activities beginning the next day-such as daily self-care, walking, climbing stairs-gradually increasing activities as tolerated.  If you can walk 30 minutes without difficulty, it is safe to try more intense activity such as jogging, treadmill, bicycling, low-impact aerobics, swimming, etc. b. Save the most intensive and strenuous activity for last such as sit-ups, heavy lifting, contact sports, etc  Refrain from any heavy lifting or straining until you are off narcotics for pain control.   c. DO NOT PUSH THROUGH PAIN.  Let pain be your guide: If it hurts to do something, don't do it.  Pain is your body warning you to avoid  that activity for another week until the pain goes down. d. You may drive when you are no longer taking prescription pain medication, you can comfortably wear a seatbelt, and you can safely maneuver your car and apply brakes. e. Dennis Bast may have sexual intercourse when it is comfortable.  9. FOLLOW UP in our office a. Please call [facility] at [phone] to set up an appointment to see your surgeon in the office for a follow-up appointment approximately 2-3 weeks after your surgery. b. Make sure that you call for this appointment the day you arrive home to insure a convenient appointment time. 10. IF YOU HAVE DISABILITY OR FAMILY LEAVE FORMS, BRING THEM TO THE OFFICE FOR  PROCESSING.  DO NOT GIVE THEM TO YOUR DOCTOR.   WHEN TO CALL us [phone]: 1. Poor pain control 2. Reactions / problems with new medications (rash/itching, nausea, etc)  3. Fever over 101.5 F (38.5 C) 4. Inability to urinate 5. Nausea and/or vomiting 6. Worsening swelling or bruising 7. Continued bleeding from incision. 8. Increased pain, redness, or drainage from the incision   The clinic staff is available to answer your questions during regular business hours (8:30am-5pm).  Please don't hesitate to call and ask to speak to one of our nurses for clinical concerns.   If you have a medical emergency, go to the nearest emergency room or call 911.  A surgeon from [facility] is always on call at the [facility], [place], [place], [place], [place]  [address] ? MAIN: [phone] ? TOLL FREE: [phone] ?  FAX [phone] [email or web address]  GETTING TO GOOD BOWEL HEALTH. Irregular bowel habits such as constipation and diarrhea can lead to many problems over time.  Having one soft bowel movement a day is the most important way to prevent further problems.  The anorectal canal is designed to handle stretching and feces to safely manage our ability to get rid of solid waste (feces, poop, stool) out of our body.  BUT, hard constipated stools can act like ripping concrete bricks and diarrhea can be a burning fire to this very sensitive area of our body, causing inflamed hemorrhoids, anal fissures, increasing risk is perirectal abscesses, abdominal pain/bloating, an making irritable bowel worse.     The goal: ONE SOFT BOWEL MOVEMENT A DAY!  To have soft, regular bowel movements:    Drink at least 8 tall glasses of water a day.     Take plenty of fiber.  Fiber is the undigested part of plant food that passes into the colon, acting s "natures broom" to encourage bowel motility and movement.  Fiber can absorb and hold  large amounts of water. This results in a larger, bulkier stool, which is soft and easier to pass. Work gradually over several weeks up to 6 servings a day of fiber (25g a day even more if needed) in the form of: o Vegetables -- Root (potatoes, carrots, turnips), leafy green (lettuce, salad greens, celery, spinach), or cooked high residue (cabbage, broccoli, etc) o Fruit -- Fresh (unpeeled skin & pulp), Dried (prunes, apricots, cherries, etc ),  or stewed ( applesauce)  o Whole grain breads, pasta, etc (whole wheat)  o Bran cereals    Bulking Agents -- This type of water-retaining fiber generally is easily obtained each day by one of the following:  o Psyllium bran -- The psyllium plant is remarkable because its ground seeds can retain so much water. This  product is available as Metamucil, Konsyl, Effersyllium, Per Diem Fiber, or the less expensive generic preparation in drug and health food stores. Although labeled a laxative, it really is not a laxative.  o Methylcellulose -- This is another fiber derived from wood which also retains water. It is available as Citrucel. o Polyethylene Glycol - and "artificial" fiber commonly called Miralax or Glycolax.  It is helpful for people with gassy or bloated feelings with regular fiber o Flax Seed - a less gassy fiber than psyllium   No reading or other relaxing activity while on the toilet. If bowel movements take longer than 5 minutes, you are too constipated   AVOID CONSTIPATION.  High fiber and water intake usually takes care of this.  Sometimes a laxative is needed to stimulate more frequent bowel movements, but    Laxatives are not a good long-term solution as it can wear the colon out. o Osmotics (Milk of Magnesia, Fleets phosphosoda, Magnesium citrate, MiraLax, GoLytely) are safer than  o Stimulants (Senokot, Castor Oil, Dulcolax, Ex Lax)    o Do not take laxatives for more than 7days in a row.    IF SEVERELY CONSTIPATED, try a Bowel Retraining  Program: o Do not use laxatives.  o Eat a diet high in roughage, such as bran cereals and leafy vegetables.  o Drink six (6) ounces of prune or apricot juice each morning.  o Eat two (2) large servings of stewed fruit each day.  o Take one (1) heaping tablespoon of a psyllium-based bulking agent twice a day. Use sugar-free sweetener when possible to avoid excessive calories.  o Eat a normal breakfast.  o Set aside 15 minutes after breakfast to sit on the toilet, but do not strain to have a bowel movement.  o If you do not have a bowel movement by the third day, use an enema and repeat the above steps.    Controlling diarrhea o Switch to liquids and simpler foods for a few days to avoid stressing your intestines further. o Avoid dairy products (especially milk & ice cream) for a short time.  The intestines often can lose the ability to digest lactose when stressed. o Avoid foods that cause gassiness or bloating.  Typical foods include beans and other legumes, cabbage, broccoli, and dairy foods.  Every person has some sensitivity to other foods, so listen to our body and avoid those foods that trigger problems for you. o Adding fiber (Citrucel, Metamucil, psyllium, Miralax) gradually can help thicken stools by absorbing excess fluid and retrain the intestines to act more normally.  Slowly increase the dose over a few weeks.  Too much fiber too soon can backfire and cause cramping & bloating. o Probiotics (such as active yogurt, Align, etc) may help repopulate the intestines and colon with normal bacteria and calm down a sensitive digestive tract.  Most studies show it to be of mild help, though, and such products can be costly. o Medicines:   Bismuth subsalicylate (ex. Kayopectate, Pepto Bismol) every 30 minutes for up to 6 doses can help control diarrhea.  Avoid if pregnant.   Loperamide (Immodium) can slow down diarrhea.  Start with two tablets (4mg  total) first and then try one tablet every 6  hours.  Avoid if you are having fevers or severe pain.  If you are not better or start feeling worse, stop all medicines and call your doctor for advice o Call your doctor if you are getting worse or not better.  Sometimes  further testing (cultures, endoscopy, X-ray studies, bloodwork, etc) may be needed to help diagnose and treat the cause of the diarrhea. o

## 2013-07-31 NOTE — Progress Notes (Signed)
Subjective:     Patient ID: Shelby Campbell, female   DOB: 07-06-1975, 38 y.o.   MRN: [id number]  HPI  Note: This dictation was prepared with Dragon/digital dictation along with [facility] technology. Any transcriptional errors that result from this process are unintentional.       Shelby Campbell  02/01/1976 [id number]  Patient Care Team: [name], MD as PCP - General (Internal Medicine) Missy Sabins, MD as Consulting Physician (Gastroenterology) Adin Hector, MD as Consulting Physician (General Surgery)  This patient is a 38 y.o.female who presents today for surgical evaluation at the request of Dr. Amedeo Plenty.   Reason for visit: Postprandial abdominal pain with gallstones  Pleasant active female.  She comes today with her husband who is in the [employer].  She struggled with intermittent abdominal pains for a year.  Initial attack last Easter after eating shrimp.  Sharp epigastric pain.  Some chest pain.  Fullness and bloating.  Altered diet.  Another attack 2 months ago.  Some nausea.  She has had repeated attacks every time she eats.  She believes it tends to be with spicy and more greasy foods.  She recalls having heartburn and reflux with her 2 pregnancies.  This does not seem like that.  She went to the emergency room.  Then followed up with gastroenterology.  She was started on proton pump inhibitors in the emergency room.  She was on that for over a month without improvement of symptoms.  She has tried Pepto-Bismol without help.  She has noted more episodes of nausea and bloating now.  She had an endoscopy that showed only microscopic gastritis.  No Helicobacter pylori.  Grossly no obvious abnormalities.  Because of persistent symptoms, ultrasound done.  Gallstones revealed.  Sent to me to consider cholecystectomy  She is rather active.  No reproduction of symptoms with exercise.  Tends to be constipated with a bowel movement once or twice a week.  She is already on fiber one tablet  without much improvement yet.  Never had a colonoscopy.  No personal nor family history of GI/colon cancer, inflammatory bowel disease, irritable bowel syndrome, allergy such as Celiac Sprue, dietary/dairy problems, colitis, ulcers nor gastritis.  No recent sick contacts/gastroenteritis.  No travel outside the country.  No changes in diet.  No dysphagia to solids or liquids.  No significant heartburn or reflux.  No hematochezia, hematemesis, coffee ground emesis.  No evidence of prior gastric/peptic ulceration.    Patient Active Problem List   Diagnosis Date Noted  . Chronic gastritis, mild.  H Pylori negative 07/31/2013  . Chronic cholecystitis with calculus 07/31/2013    Past Medical History  Diagnosis Date  . Hypertension   . Porphyria   . Asthma   . Hiatal hernia   . Mitral valve prolapse     History reviewed. No pertinent past surgical history.  History   Social History  . Marital Status: Married    Spouse Name: N/A    Number of Children: N/A  . Years of Education: N/A   Occupational History  . Not on file.   Social History Main Topics  . Smoking status: Never Smoker   . Smokeless tobacco: Not on file  . Alcohol Use: No  . Drug Use: No  . Sexual Activity: Yes   Other Topics Concern  . Not on file   Social History Narrative  . No narrative on file    History reviewed. No pertinent family history.  Current Outpatient Prescriptions  Medication Sig Dispense Refill  . acetaminophen (TYLENOL) 500 MG tablet Take 500-1,000 mg by mouth every 8 (eight) hours as needed for moderate pain or headache.      . albuterol (PROVENTIL HFA;VENTOLIN HFA) 108 (90 BASE) MCG/ACT inhaler Inhale 2 puffs into the lungs every 6 (six) hours as needed for wheezing or shortness of breath.      Marland Kitchen amLODipine (NORVASC) 5 MG tablet       . atenolol (TENORMIN) 25 MG tablet Take 25 mg by mouth daily.      . beclomethasone (QVAR) 40 MCG/ACT inhaler Inhale 2 puffs into the lungs 2 (two) times  daily.      Marland Kitchen levocetirizine (XYZAL) 5 MG tablet       . lisinopril-hydrochlorothiazide (PRINZIDE,ZESTORETIC) 20-12.5 MG per tablet Take 1 tablet by mouth daily.      . montelukast (SINGULAIR) 10 MG tablet       . cloNIDine (CATAPRES - DOSED IN MG/24 HR) 0.2 mg/24hr patch       . omeprazole (PRILOSEC) 20 MG capsule Take 1 capsule (20 mg total) by mouth daily.  30 capsule  0   No current facility-administered medications for this visit.     Allergies  Allergen Reactions  . Sulfa Antibiotics Other (See Comments)    Heart beating fast     BP 122/82  Pulse 75  Temp(Src) 97.2 F (36.2 C) (Oral)  Ht 5\' 5"  (1.651 m)  Wt 122 lb (55.339 kg)  BMI 20.30 kg/m2  US Abdomen Limited  07/19/2013   CLINICAL DATA:  Abdominal pain, gallstones.  EXAM: US ABDOMEN LIMITED - RIGHT UPPER QUADRANT  COMPARISON:  None.  FINDINGS: Gallbladder:  1.4 cm stone within the mid portion of the gallbladder. Gallbladder appears mildly contracted. Gallbladder wall upper limits normal in thickness at 2.5 mm. Negative sonographic Murphy's.  Common bile duct:  Diameter: Normal caliber, 4 mm.  Liver:  No focal lesion identified. Within normal limits in parenchymal echogenicity.  IMPRESSION: Cholelithiasis.  No sonographic evidence of acute cholecystitis.   Electronically Signed   By: Rolm Baptise M.D.   On: 07/19/2013 12:25     Review of Systems  Constitutional: Negative for fever, chills, diaphoresis, appetite change and fatigue.  HENT: Negative for ear discharge, ear pain, sore throat and trouble swallowing.   Eyes: Negative for photophobia, discharge and visual disturbance.  Respiratory: Negative for cough, choking, chest tightness and shortness of breath.   Cardiovascular: Positive for chest pain. Negative for palpitations.  Gastrointestinal: Positive for abdominal pain and abdominal distention. Negative for nausea, vomiting, diarrhea, constipation, anal bleeding and rectal pain.  Endocrine: Negative for cold  intolerance and heat intolerance.  Genitourinary: Negative for dysuria, frequency and difficulty urinating.  Musculoskeletal: Positive for back pain. Negative for gait problem, myalgias and neck pain.  Skin: Negative for color change, pallor and rash.  Allergic/Immunologic: Negative for environmental allergies, food allergies and immunocompromised state.  Neurological: Negative for dizziness, speech difficulty, weakness and numbness.  Hematological: Negative for adenopathy.  Psychiatric/Behavioral: Negative for confusion and agitation. The patient is not nervous/anxious.        Objective:   Physical Exam  Constitutional: She is oriented to person, place, and time. She appears well-developed and well-nourished. No distress.  HENT:  Head: Normocephalic.  Mouth/Throat: Oropharynx is clear and moist. No oropharyngeal exudate.  Eyes: Conjunctivae and EOM are normal. Pupils are equal, round, and reactive to light. No scleral icterus.  Neck: Normal range of motion. Neck supple. No tracheal deviation  present.  Cardiovascular: Normal rate, regular rhythm and intact distal pulses.   Pulmonary/Chest: Effort normal and breath sounds normal. No stridor. No respiratory distress. She exhibits no tenderness.  Abdominal: Soft. She exhibits no distension and no mass. There is no tenderness. Hernia confirmed negative in the right inguinal area and confirmed negative in the left inguinal area.  Genitourinary: No vaginal discharge found.  Musculoskeletal: Normal range of motion. She exhibits no tenderness.       Right elbow: She exhibits normal range of motion.       Left elbow: She exhibits normal range of motion.       Right wrist: She exhibits normal range of motion.       Left wrist: She exhibits normal range of motion.       Right hand: Normal strength noted.       Left hand: Normal strength noted.  Lymphadenopathy:       Head (right side): No posterior auricular adenopathy present.       Head (left  side): No posterior auricular adenopathy present.    She has no cervical adenopathy.    She has no axillary adenopathy.       Right: No inguinal adenopathy present.       Left: No inguinal adenopathy present.  Neurological: She is alert and oriented to person, place, and time. No cranial nerve deficit. She exhibits normal muscle tone. Coordination normal.  Skin: Skin is warm and dry. No rash noted. She is not diaphoretic. No erythema.  Psychiatric: She has a normal mood and affect. Her behavior is normal. Judgment and thought content normal.       Assessment:     Classic story of biliary colic although a little more central than usual.  No improvement on PPI and rather normal endoscopy argues against reflux.  I think she would benefit from cholecystectomy.     Plan:     Laparoscopic cholecystectomy.  Good candidate for single site approach:  The anatomy & physiology of hepatobiliary & pancreatic function was discussed.  The pathophysiology of gallbladder dysfunction was discussed.  Natural history risks without surgery was discussed.   I feel the risks of no intervention will lead to serious problems that outweigh the operative risks; therefore, I recommended cholecystectomy to remove the pathology.  I explained laparoscopic techniques with possible need for an open approach.  Probable cholangiogram to evaluate the bilary tract was explained as well.    Risks such as bleeding, infection, abscess, leak, injury to other organs, need for further treatment, heart attack, death, and other risks were discussed.  I noted a good likelihood this will help address the problem.  Possibility that this will not correct all abdominal symptoms was explained.  Goals of post-operative recovery were discussed as well.  We will work to minimize complications.  An educational handout further explaining the pathology and treatment options was given as well.  Questions were answered.  The patient expresses  understanding & wishes to proceed with surgery.  Treat constipation.  1) Avoid extremes of bowel movements (no bad constipation/diarrhea) 2) Miralax 17gm mixed in 8oz. water or juice-daily. May use BID PRN.  3) Gas-x,Phazyme, etc. as needed for gas & bloating.  4) Soft,bland diet. No spicy,greasy,fried foods.  5) Prilosec OTC as needed  6) May hold gluten/wheat products from diet to see if symptoms improve.  7)  May try probiotics (Align, Activa, etc) to help calm the bowels down 7) If symptoms become worse call back  immediately.

## 2013-08-07 ENCOUNTER — Encounter (INDEPENDENT_AMBULATORY_CARE_PROVIDER_SITE_OTHER): Payer: Self-pay

## 2013-08-22 ENCOUNTER — Encounter (HOSPITAL_COMMUNITY): Payer: Self-pay | Admitting: Pharmacy Technician

## 2013-08-26 NOTE — Patient Instructions (Signed)
Shelby Campbell  08/26/2013   Your procedure is scheduled on:  09/05/13    1000am-1144am  Report to [facility] at     0730  AM.  Call this number if you have problems the morning of surgery: [phone]   Remember:   Do not eat food or drink liquids after midnight.   Take these medicines the morning of surgery with A SIP OF WATER:    Do not wear jewelry, make-up or nail polish.  Do not wear lotions, powders, or perfumes.   Do not shave 48 hours prior to surgery.   Do not bring valuables to the hospital.  Contacts, dentures or bridgework may not be worn into surgery.       Patients discharged the day of surgery will not be allowed to drive  home.  Name and phone number of your driver:    [facility] - Preparing for Surgery Before surgery, you can play an important role.  Because skin is not sterile, your skin needs to be as free of germs as possible.  You can reduce the number of germs on your skin by washing with CHG (chlorahexidine gluconate) soap before surgery.  CHG is an antiseptic cleaner which kills germs and bonds with the skin to continue killing germs even after washing. Please DO NOT use if you have an allergy to CHG or antibacterial soaps.  If your skin becomes reddened/irritated stop using the CHG and inform your nurse when you arrive at Short Stay. Do not shave (including legs and underarms) for at least 48 hours prior to the first CHG shower.  You may shave your face. Please follow these instructions carefully:  1.  Shower with CHG Soap the night before surgery and the  morning of Surgery.  2.  If you choose to wash your hair, wash your hair first as usual with your  normal  shampoo.  3.  After you shampoo, rinse your hair and body thoroughly to remove the  shampoo.                           4.  Use CHG as you would any other liquid soap.  You can apply chg directly  to the skin and wash                       Gently with a scrungie or clean washcloth.  5.   Apply the CHG Soap to your body ONLY FROM THE NECK DOWN.   Do not use on open                           Wound or open sores. Avoid contact with eyes, ears mouth and genitals (private parts).                        Genitals (private parts) with your normal soap.             6.  Wash thoroughly, paying special attention to the area where your surgery  will be performed.  7.  Thoroughly rinse your body with warm water from the neck down.  8.  DO NOT shower/wash with your normal soap after using and rinsing off  the CHG Soap.                9.  Pat yourself dry with  a clean towel.            10.  Wear clean pajamas.            11.  Place clean sheets on your bed the night of your first shower and do not  sleep with pets. Day of Surgery : Do not apply any lotions/deodorants the morning of surgery.  Please wear clean clothes to the hospital/surgery center.  FAILURE TO FOLLOW THESE INSTRUCTIONS MAY RESULT IN THE CANCELLATION OF YOUR SURGERY PATIENT SIGNATURE_________________________________  NURSE SIGNATURE__________________________________  WHAT IS A BLOOD TRANSFUSION? Blood Transfusion Information  A transfusion is the replacement of blood or some of its parts. Blood is made up of multiple cells which provide different functions.  Red blood cells carry oxygen and are used for blood loss replacement.  White blood cells fight against infection.  Platelets control bleeding.  Plasma helps clot blood.  Other blood products are available for specialized needs, such as hemophilia or other clotting disorders. BEFORE THE TRANSFUSION  Who gives blood for transfusions?   Healthy volunteers who are fully evaluated to make sure their blood is safe. This is blood bank blood. Transfusion therapy is the safest it has ever been in the practice of medicine. Before blood is taken from a donor, a complete history is taken to make sure that person has no history of diseases nor engages in risky social  behavior (examples are intravenous drug use or sexual activity with multiple partners). The donor's travel history is screened to minimize risk of transmitting infections, such as malaria. The donated blood is tested for signs of infectious diseases, such as HIV and hepatitis. The blood is then tested to be sure it is compatible with you in order to minimize the chance of a transfusion reaction. If you or a relative donates blood, this is often done in anticipation of surgery and is not appropriate for emergency situations. It takes many days to process the donated blood. RISKS AND COMPLICATIONS Although transfusion therapy is very safe and saves many lives, the main dangers of transfusion include:   Getting an infectious disease.  Developing a transfusion reaction. This is an allergic reaction to something in the blood you were given. Every precaution is taken to prevent this. The decision to have a blood transfusion has been considered carefully by your caregiver before blood is given. Blood is not given unless the benefits outweigh the risks. AFTER THE TRANSFUSION  Right after receiving a blood transfusion, you will usually feel much better and more energetic. This is especially true if your red blood cells have gotten low (anemic). The transfusion raises the level of the red blood cells which carry oxygen, and this usually causes an energy increase.  The nurse administering the transfusion will monitor you carefully for complications. HOME CARE INSTRUCTIONS  No special instructions are needed after a transfusion. You may find your energy is better. Speak with your caregiver about any limitations on activity for underlying diseases you may have. SEEK MEDICAL CARE IF:   Your condition is not improving after your transfusion.  You develop redness or irritation at the intravenous (IV) site. SEEK IMMEDIATE MEDICAL CARE IF:  Any of the following symptoms occur over the next 12 hours:  Shaking  chills.  You have a temperature by mouth above 102 F (38.9 C), not controlled by medicine.  Chest, back, or muscle pain.  People around you feel you are not acting correctly or are confused.  Shortness of breath or difficulty  breathing.  Dizziness and fainting.  You get a rash or develop hives.  You have a decrease in urine output.  Your urine turns a dark color or changes to pink, red, or brown. Any of the following symptoms occur over the next 10 days:  You have a temperature by mouth above 102 F (38.9 C), not controlled by medicine.  Shortness of breath.  Weakness after normal activity.  The white part of the eye turns yellow (jaundice).  You have a decrease in the amount of urine or are urinating less often.  Your urine turns a dark color or changes to pink, red, or brown. Document Released: 04/22/2000 Document Revised: 07/18/2011 Document Reviewed: 12/10/2007 [facility] Patient Information 2014 [place].

## 2013-08-28 ENCOUNTER — Ambulatory Visit (HOSPITAL_COMMUNITY)
Admission: RE | Admit: 2013-08-28 | Discharge: 2013-08-28 | Disposition: A | Discharge status: Home / Self Care | Payer: BC Managed Care – PPO | Source: Ambulatory Visit | Attending: Anesthesiology | Admitting: Anesthesiology

## 2013-08-28 ENCOUNTER — Encounter (HOSPITAL_COMMUNITY): Payer: Self-pay

## 2013-08-28 ENCOUNTER — Encounter (HOSPITAL_COMMUNITY)
Admission: RE | Admit: 2013-08-28 | Discharge: 2013-08-28 | Disposition: A | Discharge status: Home / Self Care | Payer: BC Managed Care – PPO | Source: Ambulatory Visit | Attending: General Surgery | Admitting: General Surgery

## 2013-08-28 DIAGNOSIS — Z01818 Encounter for other preprocedural examination: Secondary | ICD-10-CM | POA: Insufficient documentation

## 2013-08-28 DIAGNOSIS — Z01812 Encounter for preprocedural laboratory examination: Secondary | ICD-10-CM | POA: Insufficient documentation

## 2013-08-28 HISTORY — DX: Shortness of breath: R06.02

## 2013-08-28 HISTORY — DX: Chronic kidney disease, unspecified: N18.9

## 2013-08-28 LAB — CBC
HEMATOCRIT: 28.2 % — AB (ref 36.0–46.0)
Hemoglobin: 8.9 g/dL — ABNORMAL LOW (ref 12.0–15.0)
MCH: 25.8 pg — AB (ref 26.0–34.0)
MCHC: 31.6 g/dL (ref 30.0–36.0)
MCV: 81.7 fL (ref 78.0–100.0)
PLATELETS: 282 10*3/uL (ref 150–400)
RBC: 3.45 MIL/uL — ABNORMAL LOW (ref 3.87–5.11)
RDW: 16.7 % — AB (ref 11.5–15.5)
WBC: 3.8 10*3/uL — AB (ref 4.0–10.5)

## 2013-08-28 LAB — BASIC METABOLIC PANEL
BUN: 22 mg/dL (ref 6–23)
CALCIUM: 9.1 mg/dL (ref 8.4–10.5)
CO2: 25 mEq/L (ref 19–32)
CREATININE: 1.2 mg/dL — AB (ref 0.50–1.10)
Chloride: 100 mEq/L (ref 96–112)
GFR calc Af Amer: 67 mL/min — ABNORMAL LOW (ref >90)
GFR calc non Af Amer: 57 mL/min — ABNORMAL LOW (ref >90)
Glucose, Bld: 108 mg/dL — ABNORMAL HIGH (ref 70–99)
Potassium: 3.6 mEq/L — ABNORMAL LOW (ref 3.7–5.3)
Sodium: 137 mEq/L (ref 137–147)

## 2013-08-28 LAB — HCG, SERUM, QUALITATIVE: Preg, Serum: NEGATIVE

## 2013-08-28 NOTE — Progress Notes (Addendum)
CBC and BMP results faxed via EPIC to Dr Johney Maine.  Also called office and spoke with Arlyce Dice and made her aware that abnormal CBC and BMP along with note were faxed to Dr Johney Maine.

## 2013-08-28 NOTE — Progress Notes (Signed)
Dr Johney Maine,      I routed to you the labs on patient done on preop appointment 08/28/2013 to include the CBC with hemoglobin of 8.9  and BMP.  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  I added to her chart information regarding this disorders and highlighted the drugs that can trigger this disorder.  Just wanted you to be aware.  Thank You.

## 2013-08-28 NOTE — Progress Notes (Signed)
Requested last office visit note from Dr Erling Cruz  And to include most recent labs.

## 2013-08-29 ENCOUNTER — Encounter (HOSPITAL_COMMUNITY): Payer: Self-pay

## 2013-08-29 NOTE — Progress Notes (Signed)
Last office visit note from Dr Erling Cruz 07/22/2013 on chart

## 2013-09-02 NOTE — Progress Notes (Signed)
Patient has a rare inherited enzyme disorder called prophyria.  Article regarding this rare inherited enzyme disorder located on front of chart.  It manifests with either neurological complications or skin problems or both.  This can be triggered by a number of drugs.  These drugs are listed in the article as well as under OTHER under ALLERGIES in EPIC.

## 2013-09-04 NOTE — Progress Notes (Signed)
Called patient and reviewed list of medications that I had in article related to prophyria that she could not take.  Patient did not bring list with her on day of preop.  Some medications I had on my list she did not have on her list. Patient to fax to [facility] her complete list.

## 2013-09-04 NOTE — Progress Notes (Signed)
Dr Lissa Hoard and Dr Marcell Barlow made aware of Porphyria and patient list of safe and unsafe drugs with this condition.  Also aware list on chart along with article on porphyria on front of chart.  No new orders given.

## 2013-09-04 NOTE — Progress Notes (Signed)
Patient faxed back complete list of medications in regards to Porphyria. Placed on chart.

## 2013-09-04 NOTE — Progress Notes (Signed)
Dr Johney Maine,    Patient faxed to me today long list of medications ( total of 3 pages ) which she was given when she was diagnosed with porphyria.  The first page is drugs that are unsafe , page 2 are drugs which appear to be safe, and page 3 which is drugs which are classified as both safe and unsafe.  Pharmacy has seen this list and made thenmselves an order to follow her on day of surgery 09/10/2013.  I will show this list to anesthesia today, also.  Just wanted to keep you informed.  Thank You.

## 2013-09-10 ENCOUNTER — Ambulatory Visit (HOSPITAL_COMMUNITY): Payer: BC Managed Care – PPO

## 2013-09-10 ENCOUNTER — Encounter (HOSPITAL_COMMUNITY): Payer: BC Managed Care – PPO | Admitting: Anesthesiology

## 2013-09-10 ENCOUNTER — Encounter (HOSPITAL_COMMUNITY): Payer: Self-pay | Admitting: *Deleted

## 2013-09-10 ENCOUNTER — Ambulatory Visit (HOSPITAL_COMMUNITY)
Admission: RE | Admit: 2013-09-10 | Discharge: 2013-09-10 | Disposition: A | Discharge status: Home / Self Care | Payer: BC Managed Care – PPO | Source: Ambulatory Visit | Attending: Surgery | Admitting: Surgery

## 2013-09-10 ENCOUNTER — Encounter (HOSPITAL_COMMUNITY)
Admission: RE | Disposition: A | Discharge status: Home / Self Care | Payer: Self-pay | Source: Ambulatory Visit | Attending: Surgery

## 2013-09-10 ENCOUNTER — Ambulatory Visit (HOSPITAL_COMMUNITY): Payer: BC Managed Care – PPO | Admitting: Anesthesiology

## 2013-09-10 DIAGNOSIS — N183 Chronic kidney disease, stage 3 unspecified: Secondary | ICD-10-CM | POA: Insufficient documentation

## 2013-09-10 DIAGNOSIS — I059 Rheumatic mitral valve disease, unspecified: Secondary | ICD-10-CM | POA: Insufficient documentation

## 2013-09-10 DIAGNOSIS — K8010 Calculus of gallbladder with chronic cholecystitis without obstruction: Secondary | ICD-10-CM

## 2013-09-10 DIAGNOSIS — I129 Hypertensive chronic kidney disease with stage 1 through stage 4 chronic kidney disease, or unspecified chronic kidney disease: Secondary | ICD-10-CM | POA: Insufficient documentation

## 2013-09-10 HISTORY — PX: LAPAROSCOPIC CHOLECYSTECTOMY SINGLE PORT: SHX5891

## 2013-09-10 SURGERY — LAPAROSCOPIC CHOLECYSTECTOMY SINGLE SITE
Anesthesia: General | Site: Abdomen

## 2013-09-10 MED ORDER — SODIUM CHLORIDE 0.9 % IJ SOLN: 3.0000 mL | Freq: Two times a day (BID) | INTRAMUSCULAR | Status: DC

## 2013-09-10 MED ORDER — GLYCOPYRROLATE 0.2 MG/ML IJ SOLN
INTRAMUSCULAR | Status: DC | PRN
  Administered 2013-09-10: 0.6 mg via INTRAVENOUS

## 2013-09-10 MED ORDER — EPHEDRINE SULFATE 50 MG/ML IJ SOLN
INTRAMUSCULAR | Status: DC | PRN
  Administered 2013-09-10 (×2): 5 mg via INTRAVENOUS

## 2013-09-10 MED ORDER — CISATRACURIUM BESYLATE 20 MG/10ML IV SOLN
INTRAVENOUS | Status: AC
  Filled 2013-09-10: qty 10

## 2013-09-10 MED ORDER — CISATRACURIUM BESYLATE (PF) 10 MG/5ML IV SOLN
INTRAVENOUS | Status: DC | PRN
  Administered 2013-09-10: 6 mg via INTRAVENOUS

## 2013-09-10 MED ORDER — OXYCODONE HCL 5 MG PO TABS: 5.0000 mg | ORAL_TABLET | ORAL | Status: DC | PRN

## 2013-09-10 MED ORDER — LACTATED RINGERS IR SOLN
Status: DC | PRN
  Administered 2013-09-10: 1

## 2013-09-10 MED ORDER — FENTANYL CITRATE 0.05 MG/ML IJ SOLN
INTRAMUSCULAR | Status: AC
  Filled 2013-09-10: qty 5

## 2013-09-10 MED ORDER — BUPIVACAINE-EPINEPHRINE 0.25% -1:200000 IJ SOLN
INTRAMUSCULAR | Status: DC | PRN
  Administered 2013-09-10: 5 mL

## 2013-09-10 MED ORDER — FENTANYL CITRATE 0.05 MG/ML IJ SOLN: 25.0000 ug | INTRAMUSCULAR | Status: DC | PRN

## 2013-09-10 MED ORDER — PROMETHAZINE HCL 25 MG/ML IJ SOLN
INTRAMUSCULAR | Status: AC
  Filled 2013-09-10: qty 1

## 2013-09-10 MED ORDER — SODIUM CHLORIDE 0.9 % IV SOLN
INTRAVENOUS | Status: DC | PRN
  Administered 2013-09-10 (×2): via INTRAVENOUS

## 2013-09-10 MED ORDER — PROPOFOL 10 MG/ML IV BOLUS
INTRAVENOUS | Status: AC
  Filled 2013-09-10: qty 20

## 2013-09-10 MED ORDER — ACETAMINOPHEN 500 MG PO TABS: 1000.0000 mg | ORAL_TABLET | Freq: Three times a day (TID) | ORAL | Status: DC

## 2013-09-10 MED ORDER — SODIUM CHLORIDE 0.9 % IJ SOLN: 3.0000 mL | INTRAMUSCULAR | Status: DC | PRN

## 2013-09-10 MED ORDER — MORPHINE SULFATE 10 MG/ML IJ SOLN
1.0000 mg | INTRAMUSCULAR | Status: DC | PRN
  Administered 2013-09-10 (×2): 2 mg via INTRAVENOUS

## 2013-09-10 MED ORDER — LACTATED RINGERS IV SOLN
INTRAVENOUS | Status: DC
  Administered 2013-09-10: 1000 mL via INTRAVENOUS

## 2013-09-10 MED ORDER — LACTATED RINGERS IV SOLN: INTRAVENOUS | Status: DC

## 2013-09-10 MED ORDER — MIDAZOLAM HCL 2 MG/2ML IJ SOLN
INTRAMUSCULAR | Status: AC
  Filled 2013-09-10: qty 2

## 2013-09-10 MED ORDER — CHLORHEXIDINE GLUCONATE 4 % EX LIQD: 1.0000 "application " | Freq: Once | CUTANEOUS | Status: DC

## 2013-09-10 MED ORDER — GLYCOPYRROLATE 0.2 MG/ML IJ SOLN
INTRAMUSCULAR | Status: AC
Start: 2013-09-10 — End: 2013-09-10
  Filled 2013-09-10: qty 3

## 2013-09-10 MED ORDER — METOCLOPRAMIDE HCL 5 MG/ML IJ SOLN
INTRAMUSCULAR | Status: AC
  Filled 2013-09-10: qty 2

## 2013-09-10 MED ORDER — PROMETHAZINE HCL 25 MG/ML IJ SOLN
6.2500 mg | INTRAMUSCULAR | Status: DC | PRN
  Administered 2013-09-10: 6.25 mg via INTRAVENOUS

## 2013-09-10 MED ORDER — PROPOFOL 10 MG/ML IV BOLUS
INTRAVENOUS | Status: DC | PRN
  Administered 2013-09-10: 110 mg via INTRAVENOUS

## 2013-09-10 MED ORDER — MORPHINE SULFATE 10 MG/ML IJ SOLN
INTRAMUSCULAR | Status: AC
  Filled 2013-09-10: qty 1

## 2013-09-10 MED ORDER — DEXAMETHASONE SODIUM PHOSPHATE 10 MG/ML IJ SOLN
INTRAMUSCULAR | Status: AC
  Filled 2013-09-10: qty 1

## 2013-09-10 MED ORDER — BUPIVACAINE-EPINEPHRINE 0.25% -1:200000 IJ SOLN
INTRAMUSCULAR | Status: AC
  Filled 2013-09-10: qty 1

## 2013-09-10 MED ORDER — SODIUM CHLORIDE 0.9 % IV SOLN: 250.0000 mL | INTRAVENOUS | Status: DC | PRN

## 2013-09-10 MED ORDER — FENTANYL CITRATE 0.05 MG/ML IJ SOLN
INTRAMUSCULAR | Status: DC | PRN
  Administered 2013-09-10: 100 ug via INTRAVENOUS

## 2013-09-10 MED ORDER — IOHEXOL 300 MG/ML  SOLN
INTRAMUSCULAR | Status: DC | PRN
  Administered 2013-09-10: 4.5 mL

## 2013-09-10 MED ORDER — ONDANSETRON HCL 4 MG/2ML IJ SOLN
INTRAMUSCULAR | Status: AC
  Filled 2013-09-10: qty 2

## 2013-09-10 MED ORDER — SODIUM CHLORIDE 0.9 % IJ SOLN
INTRAMUSCULAR | Status: AC
  Filled 2013-09-10: qty 10

## 2013-09-10 MED ORDER — NEOSTIGMINE METHYLSULFATE 10 MG/10ML IV SOLN
INTRAVENOUS | Status: DC | PRN
  Administered 2013-09-10: 3 mg via INTRAVENOUS

## 2013-09-10 SURGICAL SUPPLY — 36 items
APPLIER CLIP 5 13 M/L LIGAMAX5 (MISCELLANEOUS) ×3
CABLE HIGH FREQUENCY MONO STRZ (ELECTRODE) ×3 IMPLANT
CANISTER SUCTION 2500CC (MISCELLANEOUS) IMPLANT
CHLORAPREP W/TINT 26ML (MISCELLANEOUS) ×3 IMPLANT
CLIP APPLIE 5 13 M/L LIGAMAX5 (MISCELLANEOUS) ×1 IMPLANT
COVER MAYO STAND STRL (DRAPES) ×3 IMPLANT
DECANTER SPIKE VIAL GLASS SM (MISCELLANEOUS) IMPLANT
DRAIN CHANNEL 19F RND (DRAIN) IMPLANT
DRAPE C-ARM 42X120 X-RAY (DRAPES) ×3 IMPLANT
DRAPE LAPAROSCOPIC ABDOMINAL (DRAPES) ×3 IMPLANT
DRAPE UTILITY XL STRL (DRAPES) ×3 IMPLANT
DRAPE WARM FLUID 44X44 (DRAPE) ×3 IMPLANT
DRSG TEGADERM 4X4.75 (GAUZE/BANDAGES/DRESSINGS) IMPLANT
ELECT REM PT RETURN 9FT ADLT (ELECTROSURGICAL) ×3
ELECTRODE REM PT RTRN 9FT ADLT (ELECTROSURGICAL) ×1 IMPLANT
ENDOLOOP SUT PDS II  0 18 (SUTURE)
ENDOLOOP SUT PDS II 0 18 (SUTURE) IMPLANT
EVACUATOR SILICONE 100CC (DRAIN) IMPLANT
GAUZE SPONGE 2X2 8PLY STRL LF (GAUZE/BANDAGES/DRESSINGS) IMPLANT
GLOVE ECLIPSE 8.0 STRL XLNG CF (GLOVE) ×6 IMPLANT
GLOVE INDICATOR 8.0 STRL GRN (GLOVE) ×6 IMPLANT
GOWN STRL REUS W/TWL XL LVL3 (GOWN DISPOSABLE) ×6 IMPLANT
KIT BASIN OR (CUSTOM PROCEDURE TRAY) ×3 IMPLANT
NS IRRIG 1000ML POUR BTL (IV SOLUTION) ×3 IMPLANT
POUCH SPECIMEN RETRIEVAL 10MM (ENDOMECHANICALS) IMPLANT
SCALPEL HARMONIC ACE (MISCELLANEOUS) ×3 IMPLANT
SCISSORS LAP 5X35 DISP (ENDOMECHANICALS) ×3 IMPLANT
SET CHOLANGIOGRAPH MIX (MISCELLANEOUS) ×3 IMPLANT
SET IRRIG TUBING LAPAROSCOPIC (IRRIGATION / IRRIGATOR) ×3 IMPLANT
SPONGE GAUZE 2X2 STER 10/PKG (GAUZE/BANDAGES/DRESSINGS)
SUT MNCRL AB 4-0 PS2 18 (SUTURE) ×3 IMPLANT
TOWEL OR 17X26 10 PK STRL BLUE (TOWEL DISPOSABLE) ×3 IMPLANT
TOWEL OR NON WOVEN STRL DISP B (DISPOSABLE) ×3 IMPLANT
TRAY LAP CHOLE (CUSTOM PROCEDURE TRAY) ×3 IMPLANT
TUBING INSUFFLATION 10FT LAP (TUBING) ×3 IMPLANT
WATER STERILE IRR 1500ML POUR (IV SOLUTION) ×3 IMPLANT

## 2013-09-10 NOTE — Discharge Instructions (Signed)
LAPAROSCOPIC SURGERY: POST OP INSTRUCTIONS ° °1. DIET: Follow a light bland diet the first 24 hours after arrival home, such as soup, liquids, crackers, etc.  Be sure to include lots of fluids daily.  Avoid fast food or heavy meals as your are more likely to get nauseated.  Eat a low fat the next few days after surgery.   °2. Take your usually prescribed home medications unless otherwise directed. °3. PAIN CONTROL: °a. Pain is best controlled by a usual combination of three different methods TOGETHER: °i. Ice/Heat °ii. Over the counter pain medication °iii. Prescription pain medication °b. Most patients will experience some swelling and bruising around the incisions.  Ice packs or heating pads (30-60 minutes up to 6 times a day) will help. Use ice for the first few days to help decrease swelling and bruising, then switch to heat to help relax tight/sore spots and speed recovery.  Some people prefer to use ice alone, heat alone, alternating between ice & heat.  Experiment to what works for you.  Swelling and bruising can take several weeks to resolve.   °c. It is helpful to take an over-the-counter pain medication regularly for the first few weeks.  Choose one of the following that works best for you: °i. Naproxen (Aleve, etc)  Two 220mg tabs twice a day °ii. Ibuprofen (Advil, etc) Three 200mg tabs four times a day (every meal & bedtime) °iii. Acetaminophen (Tylenol, etc) 500-650mg four times a day (every meal & bedtime) °d. A  prescription for pain medication (such as oxycodone, hydrocodone, etc) should be given to you upon discharge.  Take your pain medication as prescribed.  °i. If you are having problems/concerns with the prescription medicine (does not control pain, nausea, vomiting, rash, itching, etc), please call us [phone] to see if we need to switch you to a different pain medicine that will work better for you and/or control your side effect better. °ii. If you need a refill on your pain medication,  please contact your pharmacy.  They will contact our office to request authorization. Prescriptions will not be filled after 5 pm or on week-ends. °4. Avoid getting constipated.  Between the surgery and the pain medications, it is common to experience some constipation.  Increasing fluid intake and taking a fiber supplement (such as Metamucil, Citrucel, FiberCon, MiraLax, etc) 1-2 times a day regularly will usually help prevent this problem from occurring.  A mild laxative (prune juice, Milk of Magnesia, MiraLax, etc) should be taken according to package directions if there are no bowel movements after 48 hours.   °5. Watch out for diarrhea.  If you have many loose bowel movements, simplify your diet to bland foods & liquids for a few days.  Stop any stool softeners and decrease your fiber supplement.  Switching to mild anti-diarrheal medications (Kayopectate, Pepto Bismol) can help.  If this worsens or does not improve, please call us. °6. Wash / shower every day.  You may shower over the dressings as they are waterproof.  Continue to shower over incision(s) after the dressing is off. °7. Remove your waterproof bandages 5 days after surgery.  You may leave the incision open to air.  You may replace a dressing/Band-Aid to cover the incision for comfort if you wish.  °8. ACTIVITIES as tolerated:   °a. You may resume regular (light) daily activities beginning the next day--such as daily self-care, walking, climbing stairs--gradually increasing activities as tolerated.  If you can walk 30 minutes without difficulty, it   is safe to try more intense activity such as jogging, treadmill, bicycling, low-impact aerobics, swimming, etc. b. Save the most intensive and strenuous activity for last such as sit-ups, heavy lifting, contact sports, etc  Refrain from any heavy lifting or straining until you are off narcotics for pain control.   c. DO NOT PUSH THROUGH PAIN.  Let pain be your guide: If it hurts to do something, don't  do it.  Pain is your body warning you to avoid that activity for another week until the pain goes down. d. You may drive when you are no longer taking prescription pain medication, you can comfortably wear a seatbelt, and you can safely maneuver your car and apply brakes. e. Dennis Bast may have sexual intercourse when it is comfortable.  9. FOLLOW UP in our office a. Please call [facility] at [phone] to set up an appointment to see your surgeon in the office for a follow-up appointment approximately 2-3 weeks after your surgery. b. Make sure that you call for this appointment the day you arrive home to insure a convenient appointment time. 10. IF YOU HAVE DISABILITY OR FAMILY LEAVE FORMS, BRING THEM TO THE OFFICE FOR PROCESSING.  DO NOT GIVE THEM TO YOUR DOCTOR.   WHEN TO CALL us [phone]: 1. Poor pain control 2. Reactions / problems with new medications (rash/itching, nausea, etc)  3. Fever over 101.5 F (38.5 C) 4. Inability to urinate 5. Nausea and/or vomiting 6. Worsening swelling or bruising 7. Continued bleeding from incision. 8. Increased pain, redness, or drainage from the incision   The clinic staff is available to answer your questions during regular business hours (8:30am-5pm).  Please dont hesitate to call and ask to speak to one of our nurses for clinical concerns.   If you have a medical emergency, go to the nearest emergency room or call 911.  A surgeon from [facility] is always on call at the [facility], [place], [place], [place], [place]  [address] ? MAIN: [phone] ? TOLL FREE: [phone] ?  FAX [phone] [email or web address]  GETTING TO GOOD BOWEL HEALTH. Irregular bowel habits such as constipation and diarrhea can lead to many problems over time.  Having one soft bowel movement a day is the most important way to prevent further problems.  The anorectal canal is designed to handle  stretching and feces to safely manage our ability to get rid of solid waste (feces, poop, stool) out of our body.  BUT, hard constipated stools can act like ripping concrete bricks and diarrhea can be a burning fire to this very sensitive area of our body, causing inflamed hemorrhoids, anal fissures, increasing risk is perirectal abscesses, abdominal pain/bloating, an making irritable bowel worse.     The goal: ONE SOFT BOWEL MOVEMENT A DAY!  To have soft, regular bowel movements:    Drink at least 8 tall glasses of water a day.     Take plenty of fiber.  Fiber is the undigested part of plant food that passes into the colon, acting s natures broom to encourage bowel motility and movement.  Fiber can absorb and hold large amounts of water. This results in a larger, bulkier stool, which is soft and easier to pass. Work gradually over several weeks up to 6 servings a day of fiber (25g a day even more if needed) in the form of: o Vegetables -- Root (potatoes, carrots, turnips), leafy green (lettuce, salad greens, celery,  spinach), or cooked high residue (cabbage, broccoli, etc) o Fruit -- Fresh (unpeeled skin & pulp), Dried (prunes, apricots, cherries, etc ),  or stewed ( applesauce)  o Whole grain breads, pasta, etc (whole wheat)  o Bran cereals    Bulking Agents -- This type of water-retaining fiber generally is easily obtained each day by one of the following:  o Psyllium bran -- The psyllium plant is remarkable because its ground seeds can retain so much water. This product is available as Metamucil, Konsyl, Effersyllium, Per Diem Fiber, or the less expensive generic preparation in drug and health food stores. Although labeled a laxative, it really is not a laxative.  o Methylcellulose -- This is another fiber derived from wood which also retains water. It is available as Citrucel. o Polyethylene Glycol - and artificial fiber commonly called Miralax or Glycolax.  It is helpful for people with gassy or  bloated feelings with regular fiber o Flax Seed - a less gassy fiber than psyllium   No reading or other relaxing activity while on the toilet. If bowel movements take longer than 5 minutes, you are too constipated   AVOID CONSTIPATION.  High fiber and water intake usually takes care of this.  Sometimes a laxative is needed to stimulate more frequent bowel movements, but    Laxatives are not a good long-term solution as it can wear the colon out. o Osmotics (Milk of Magnesia, Fleets phosphosoda, Magnesium citrate, MiraLax, GoLytely) are safer than  o Stimulants (Senokot, Castor Oil, Dulcolax, Ex Lax)    o Do not take laxatives for more than 7days in a row.    IF SEVERELY CONSTIPATED, try a Bowel Retraining Program: o Do not use laxatives.  o Eat a diet high in roughage, such as bran cereals and leafy vegetables.  o Drink six (6) ounces of prune or apricot juice each morning.  o Eat two (2) large servings of stewed fruit each day.  o Take one (1) heaping tablespoon of a psyllium-based bulking agent twice a day. Use sugar-free sweetener when possible to avoid excessive calories.  o Eat a normal breakfast.  o Set aside 15 minutes after breakfast to sit on the toilet, but do not strain to have a bowel movement.  o If you do not have a bowel movement by the third day, use an enema and repeat the above steps.    Controlling diarrhea o Switch to liquids and simpler foods for a few days to avoid stressing your intestines further. o Avoid dairy products (especially milk & ice cream) for a short time.  The intestines often can lose the ability to digest lactose when stressed. o Avoid foods that cause gassiness or bloating.  Typical foods include beans and other legumes, cabbage, broccoli, and dairy foods.  Every person has some sensitivity to other foods, so listen to our body and avoid those foods that trigger problems for you. o Adding fiber (Citrucel, Metamucil, psyllium, Miralax) gradually can help  thicken stools by absorbing excess fluid and retrain the intestines to act more normally.  Slowly increase the dose over a few weeks.  Too much fiber too soon can backfire and cause cramping & bloating. o Probiotics (such as active yogurt, Align, etc) may help repopulate the intestines and colon with normal bacteria and calm down a sensitive digestive tract.  Most studies show it to be of mild help, though, and such products can be costly. o Medicines:   Bismuth subsalicylate (ex. Kayopectate, [place]) every  30 minutes for up to 6 doses can help control diarrhea.  Avoid if pregnant.   Loperamide (Immodium) can slow down diarrhea.  Start with two tablets (4mg  total) first and then try one tablet every 6 hours.  Avoid if you are having fevers or severe pain.  If you are not better or start feeling worse, stop all medicines and call your doctor for advice o Call your doctor if you are getting worse or not better.  Sometimes further testing (cultures, endoscopy, X-ray studies, bloodwork, etc) may be needed to help diagnose and treat the cause of the diarrhea.  Managing Pain  Pain after surgery or related to activity is often due to strain/injury to muscle, tendon, nerves and/or incisions.  This pain is usually short-term and will improve in a few months.   Many people find it helpful to do the following things TOGETHER to help speed the process of healing and to get back to regular activity more quickly:  1. Avoid heavy physical activity a.  no lifting greater than 20 pounds b. Do not push through the pain.  Listen to your body and avoid positions and maneuvers than reproduce the pain c. Walking is okay as tolerated, but go slowly and stop when getting sore.  d. Remember: If it hurts to do it, then dont do it! 2. Take Anti-inflammatory medication  a. Take with food/snack around the clock for 1-2 weeks i. This helps the muscle and nerve tissues become less irritable and calm down  faster b. Choose ONE of the following over-the-counter medications: i. Naproxen 220mg  tabs (ex. Aleve) 1-2 pills twice a day  ii. Ibuprofen 200mg  tabs (ex. Advil, Motrin) 3-4 pills with every meal and just before bedtime iii. Acetaminophen 500mg  tabs (Tylenol) 1-2 pills with every meal and just before bedtime 3. Use a Heating pad or Ice/Cold Pack a. 4-6 times a day b. May use warm bath/hottub  or showers 4. Try Gentle Massage and/or Stretching  a. at the area of pain many times a day b. stop if you feel pain - do not overdo it  Try these steps together to help you body heal faster and avoid making things get worse.  Doing just one of these things may not be enough.    If you are not getting better after two weeks or are noticing you are getting worse, contact our office for further advice; we may need to re-evaluate you & see what other things we can do to help.

## 2013-09-10 NOTE — Op Note (Signed)
09/10/2013  12:01 PM  PATIENT:  Shelby Campbell  38 y.o. female  Patient Care Team: [name], MD as PCP - General (Internal Medicine) Missy Sabins, MD as Consulting Physician (Gastroenterology) Adin Hector, MD as Consulting Physician (General Surgery)  PRE-OPERATIVE DIAGNOSIS:  symptomatic biliary colic, probable chronic cholecystitis  POST-OPERATIVE DIAGNOSIS:  symptomatic biliary colic, probable chronic cholecystitis  PROCEDURE:  Procedure(s): LAPAROSCOPIC CHOLECYSTECTOMY SINGLE SITE WITH INTRAOPERATIVE CHOLANGIOGRAM  SURGEON:  Surgeon(s): Adin Hector, MD  ASSISTANT: Darcella Gasman, PA-student, [facility]  ANESTHESIA:   local (0.25% bupivacaine w epi x 92mL) and general  EBL:  Total I/O In: 1000 [I.V.:1000] Out: -   Delay start of Pharmacological VTE agent (>24hrs) due to surgical blood loss or risk of bleeding:  no  DRAINS: none   SPECIMEN:  Source of Specimen:  Gallbladder   DISPOSITION OF SPECIMEN:  PATHOLOGY  COUNTS:  YES  PLAN OF CARE: Discharge to home after PACU  PATIENT DISPOSITION:  PACU - hemodynamically stable.  INDICATION: Pleasant woman with classic biliary colic.  The rest of her  differential diagnosis seems unlikely.  I recommended cholecystectomy  The anatomy & physiology of hepatobiliary & pancreatic function was discussed.  The pathophysiology of gallbladder dysfunction was discussed.  Natural history risks without surgery was discussed.   I feel the risks of no intervention will lead to serious problems that outweigh the operative risks; therefore, I recommended cholecystectomy to remove the pathology.  I explained laparoscopic techniques with possible need for an open approach.  Probable cholangiogram to evaluate the bilary tract was explained as well.    Risks such as bleeding, infection, abscess, leak, injury to other organs, need for further treatment, heart attack, death, and other risks were discussed.  I noted a good likelihood  this will help address the problem.  Possibility that this will not correct all abdominal symptoms was explained.  Goals of post-operative recovery were discussed as well.  We will work to minimize complications.  An educational handout further explaining the pathology and treatment options was given as well.  Questions were answered.  The patient expresses understanding & wishes to proceed with surgery.  OR FINDINGS: Dilated boggy gallbladder with some grayish discoloration and thickening suggestive of chronic cholecystitis.  Classic biliary anatomy on cholangiogram.  DESCRIPTION:   The patient was identified & brought in the operating room. The patient was positioned supine with arms tucked. SCDs were active during the entire case. The patient underwent general anesthesia without any difficulty.  The abdomen was prepped and draped in a sterile fashion. A Surgical Timeout confirmed our plan.  I made a transverse curvilinear incision through the superior umbilical fold.  I placed a 62mm long port through the supraumbilical fascia using a modified Hassan cutdown technique. I began carbon dioxide insufflation. Camera inspection revealed no injury. There were no adhesions to the anterior abdominal wall supraumbilically.  I proceeded to continue with single site technique. I placed a #5 port in left upper aspect of the wound. I placed a 5 mm atraumatic grasper in the right inferior aspect of the wound.  I turned attention to the right upper quadrant.  The gallbladder fundus was elevated cephalad. I freed the peritoneal coverings between the gallbladder and the liver on the posteriolateral and anteriomedial walls. I alternated between Harmonic & blunt [place] dissection to help get a good critical view of the cystic artery and cystic duct. I did further dissection to free a few centimeters of the  gallbladder  off the liver bed to get a good critical view of the infundibulum and cystic duct. I mobilized the  cystic artery; and, after getting a good 360 view, ligated the cystic artery anterior & posterior branches using the Harmonic ultrasonic dissection. I skeletonized the cystic duct.  I placed a clip on the infundibulum. I did a partial cystic duct-otomy and ensured patency. I placed a 5 [place] cholangiocatheter through a puncture site at the right subcostal ridge of the abdominal wall and directed it into the cystic duct.  We ran a cholangiogram with dilute radio-opaque contrast and continuous fluoroscopy.  Contrast flowed from a side branch consistent with cystic duct cannulization. Contrast flowed up the common hepatic duct into the right and left intrahepatic chains out to secondary radicals. Contrast flowed down the common bile duct easily across the normal ampulla into the duodenum.  This was consistent with a normal cholangiogram.  I removed the cholangiocatheter. I placed clips on the cystic duct x4.  I completed cystic duct transection. I freed the gallbladder from its remaining attachments to the liver. I ensured hemostasis on the gallbladder fossa of the liver and elsewhere. I inspected the rest of the abdomen & detected no injury nor bleeding elsewhere.  I removed the gallbladder out the supraumbilical fascia. I closed the fascia transversely using 0 Vicryl interrupted stitches. A closed the skin using 4-0 monocryl stitch.  Sterile dressing was applied. The patient was extubated & arrived in the PACU in stable condition..  I had discussed postoperative care with the patient in the holding area. I am about to locate the patient's family and discuss operative findings and postoperative goals / instructions.  Instructions are written in the chart as well.

## 2013-09-10 NOTE — H&P (Signed)
[place], MD, [address] [place], [place] [address] Phone: [phone] FAX: Manning  20-Mar-1976 [id number]  CARE TEAM:  PCP: Shelby Greenland, MD  Outpatient Care Team: Patient Care Team: Shelby Chard, MD as PCP - General (Internal Medicine) Shelby Sabins, MD as Consulting Physician (Gastroenterology) Shelby Hector, MD as Consulting Physician (General Surgery)  Inpatient Treatment Team: Treatment Team: Attending Provider: Adin Hector, MD  Pleasant active female. She comes today with her husband who is in the [employer]. She struggled with intermittent abdominal pains for a year. Initial attack last Easter after eating shrimp. Sharp epigastric pain. Some chest pain. Fullness and bloating. Altered diet. Another attack 2 months ago. Some nausea. She has had repeated attacks every time she eats. She believes it tends to be with spicy and more greasy foods.   She recalls having heartburn and reflux with her 2 pregnancies. This does not seem like that. She went to the emergency room. Then followed up with gastroenterology. She was started on proton pump inhibitors in the emergency room. She was on that for over a month without improvement of symptoms. She has tried Pepto-Bismol without help. She has noted more episodes of nausea and bloating now. She had an endoscopy that showed only microscopic gastritis. No Helicobacter pylori. Grossly no obvious abnormalities. Because of persistent symptoms, ultrasound done. Gallstones revealed. Sent to me to consider cholecystectomy.  She is rather active. No reproduction of symptoms with exercise. Tends to be constipated with a bowel movement once or twice a week. She is already on fiber one tablet without much improvement yet. Never had a colonoscopy. No personal nor family history of GI/colon cancer, inflammatory bowel disease, irritable bowel syndrome,  allergy such as Celiac Sprue, dietary/dairy problems, colitis, ulcers nor gastritis. No recent sick contacts/gastroenteritis. No travel outside the country. No changes in diet. No dysphagia to solids or liquids. No significant heartburn or reflux. No hematochezia, hematemesis, coffee ground emesis. No evidence of prior gastric/peptic ulceration.  Dx with porphyria.  O/w no new events   Past Medical History  Diagnosis Date  . Hypertension   . Porphyria   . Asthma   . Hiatal hernia   . Mitral valve prolapse   . Shortness of breath     related to asthma   . Porphyria   . Chronic kidney disease     sees Dr Shelby Campbell- stage III per note     Past Surgical History  Procedure Laterality Date  . Axillary lymph node biopsy  2010    right   . Tubal ligation      History   Social History  . Marital Status: Married    Spouse Name: N/A    Number of Children: N/A  . Years of Education: N/A   Occupational History  . Not on file.   Social History Main Topics  . Smoking status: Never Smoker   . Smokeless tobacco: Never Used  . Alcohol Use: Yes     Comment: occasional  . Drug Use: No  . Sexual Activity: Yes   Other Topics Concern  . Not on file   Social History Narrative  . No narrative on file    History reviewed. No pertinent family history.  Current Facility-Administered Medications  Medication Dose Route Frequency Provider Last Rate Last Dose  . chlorhexidine (HIBICLENS) 4 % liquid 1 application  1 application Topical Once Shelby Hector, MD      . Derrill Memo ON 09/11/2013] chlorhexidine (HIBICLENS) 4 % liquid 1 application  1 application Topical Once Shelby Hector, MD      . lactated ringers infusion   Intravenous Continuous Shelby Retort, MD   1,000 mL at 09/10/13 0942  . lactated ringers infusion   Intravenous Continuous Shelby Retort, MD       Facility-Administered Medications Ordered in Other Encounters  Medication Dose Route Frequency Provider Last Rate Last  Dose  . 0.9 %  sodium chloride infusion    Continuous PRN Shelby Mech, CRNA         Allergies  Allergen Reactions  . Other Other (See Comments)    Sulfonamides, sulfonylureas, barbiturates, antifungals, ketamine, etomidate, rifapentine, rifampicin, rifabutine, nitrofurantoin, metronidazole, ergot derivatives, indinavir, nevirapine, ritonavir, saquinavir, progestogens, progesterones, carbamazepine, phenytoin, valproate, oxycodone, pentazocine, cocaine, gold antirheumatics agents, sodium aurothiomalate, methyldopa, fenfluramine, ethosuzimide, flupentixol, flutamide, disulfiram, lidocaine   . Sulfa Antibiotics Other (See Comments)    Heart beating fast     ROS: Constitutional:  No fevers, chills, sweats.  Weight stable Eyes:  No vision changes, No discharge HENT:  No sore throats, nasal drainage Lymph: No neck swelling, No bruising easily Pulmonary:  No cough, productive sputum CV: No orthopnea, PND  No exertional chest/neck/shoulder/arm pain. GI:  No personal nor family history of GI/colon cancer, inflammatory bowel disease, irritable bowel syndrome, allergy such as Celiac Sprue, dietary/dairy problems, colitis, ulcers nor gastritis.  No recent sick contacts/gastroenteritis.  No travel outside the country.  No changes in diet. Renal: No UTIs, No hematuria Genital:  No drainage, bleeding, masses Musculoskeletal: No severe joint pain.  Good ROM major joints Skin:  No sores or lesions.  No rashes Heme/Lymph:  No easy bleeding.  No swollen lymph nodes Neuro: No focal weakness/numbness.  No seizures Psych: No suicidal ideation.  No hallucinations  BP 124/81  Pulse 64  Temp(Src) 97.9 F (36.6 C) (Oral)  Resp 18  SpO2 100%  LMP 08/21/2013  Physical Exam: General: Pt awake/alert/oriented x4 in no major acute distress Eyes: PERRL, normal EOM. Sclera nonicteric Neuro: CN II-XII intact w/o focal sensory/motor deficits. Lymph: No head/neck/groin lymphadenopathy Psych:  No  delerium/psychosis/paranoia HENT: Normocephalic, Mucus membranes moist.  No thrush Neck: Supple, No tracheal deviation Chest: No pain.  Good respiratory excursion. CV:  Pulses intact.  Regular rhythm Abdomen: Soft, Nondistended.  Nontender.  No incarcerated hernias. Ext:  SCDs BLE.  No significant edema.  No cyanosis Skin: No petechiae / purpurea.  No major sores Musculoskeletal: No severe joint pain.  Good ROM major joints   Results:   Labs: No results found for this or any previous visit (from the past 48 hour(s)).  Imaging / Studies: Dg Chest 2 View  08/28/2013   CLINICAL DATA:  preop for cholecystectomy  EXAM: CHEST  2 VIEW  COMPARISON:  None.  FINDINGS: Cardiomediastinal silhouette is stable. No acute infiltrate or pleural effusion. No pulmonary edema. Mild upper lumbar levoscoliosis.  IMPRESSION: No active cardiopulmonary disease.   Electronically Signed   By: Lahoma Crocker M.D.   On: 08/28/2013 15:08    Medications / Allergies: per chart  Antibiotics: Anti-infectives   None      Assessment  Shelby Campbell  38 y.o. female  Day of Surgery  Procedure(s): LAPAROSCOPIC CHOLECYSTECTOMY SINGLE PORT  Problem List:  Active Problems:   * No active hospital problems. *   Biliary colic w GS, prob  chronic cholecystitis  Plan:  Lap chole:  The anatomy & physiology of hepatobiliary & pancreatic function was discussed.  The pathophysiology of gallbladder dysfunction was discussed.  Natural history risks without surgery was discussed.   I feel the risks of no intervention will lead to serious problems that outweigh the operative risks; therefore, I recommended cholecystectomy to remove the pathology.  I explained laparoscopic techniques with possible need for an open approach.  Probable cholangiogram to evaluate the bilary tract was explained as well.    Risks such as bleeding, infection, abscess, leak, injury to other organs, need for further treatment, heart attack, death, and  other risks were discussed.  I noted a good likelihood this will help address the problem.  Possibility that this will not correct all abdominal symptoms was explained.  Goals of post-operative recovery were discussed as well.  We will work to minimize complications.  Questions were answered.  The patient expresses understanding & wishes to proceed with surgery.    -VTE prophylaxis- SCDs, etc -mobilize as tolerated to help recovery    Shelby Campbell, M.D., F.A.C.S. Gastrointestinal and Minimally Invasive Surgery [facility] [address] [place], [place] [address] [phone] Main / Paging   09/10/2013  Note: This dictation was prepared with Dragon/digital dictation along with [facility] technology. Any transcriptional errors that result from this process are unintentional.

## 2013-09-10 NOTE — Anesthesia Postprocedure Evaluation (Signed)
Anesthesia Post Note  Patient: Shelby Campbell  Procedure(s) Performed: Procedure(s) (LRB): LAPAROSCOPIC CHOLECYSTECTOMY SINGLE SITE WITH CHOLANGIOGRAM (N/A)  Anesthesia type: General  Patient location: PACU  Post pain: Pain level controlled  Post assessment: Post-op Vital signs reviewed  Last Vitals: BP 127/80  Pulse 56  Temp(Src) 36.4 C (Oral)  Resp 13  SpO2 100%  LMP 08/21/2013  Post vital signs: Reviewed  Level of consciousness: sedated  Complications: No apparent anesthesia complications

## 2013-09-10 NOTE — Transfer of Care (Signed)
Immediate Anesthesia Transfer of Care Note  Patient: Shelby Campbell  Procedure(s) Performed: Procedure(s): LAPAROSCOPIC CHOLECYSTECTOMY SINGLE SITE WITH CHOLANGIOGRAM (N/A)  Patient Location: PACU  Anesthesia Type:General  Level of Consciousness: awake, alert , sedated and patient cooperative  Airway & Oxygen Therapy: Patient Spontanous Breathing and Patient connected to face mask oxygen  Post-op Assessment: Report given to PACU RN and Post -op Vital signs reviewed and stable  Post vital signs: Reviewed and stable  Complications: No apparent anesthesia complications

## 2013-09-10 NOTE — Anesthesia Preprocedure Evaluation (Signed)
Anesthesia Evaluation  Patient identified by MRN, date of birth, ID band Patient awake    Reviewed: Allergy & Precautions, H&P , NPO status , Patient's Chart, lab work & pertinent test results  Airway Mallampati: I TM Distance: >3 FB Neck ROM: Full    Dental  (+) Dental Advisory Given   Pulmonary shortness of breath, asthma ,  breath sounds clear to auscultation- rhonchi        Cardiovascular hypertension, Pt. on medications Rhythm:Regular Rate:Normal     Neuro/Psych  Neuromuscular disease negative neurological ROS  negative psych ROS   GI/Hepatic negative GI ROS, Neg liver ROS, hiatal hernia,   Endo/Other  negative endocrine ROS  Renal/GU CRFRenal disease     Musculoskeletal negative musculoskeletal ROS (+)   Abdominal   Peds  Hematology negative hematology ROS (+)   Anesthesia Other Findings   Reproductive/Obstetrics negative OB ROS                           Anesthesia Physical Anesthesia Plan  ASA: III  Anesthesia Plan: General   Post-op Pain Management:    Induction: Intravenous  Airway Management Planned: Oral ETT  Additional Equipment:   Intra-op Plan:   Post-operative Plan: Extubation in OR  Informed Consent: I have reviewed the patients History and Physical, chart, labs and discussed the procedure including the risks, benefits and alternatives for the proposed anesthesia with the patient or authorized representative who has indicated his/her understanding and acceptance.   Dental advisory given  Plan Discussed with: CRNA  Anesthesia Plan Comments:         Anesthesia Quick Evaluation

## 2013-09-12 ENCOUNTER — Encounter (HOSPITAL_COMMUNITY): Payer: Self-pay | Admitting: Surgery

## 2013-09-12 ENCOUNTER — Telehealth (INDEPENDENT_AMBULATORY_CARE_PROVIDER_SITE_OTHER): Payer: Self-pay

## 2013-09-12 NOTE — Telephone Encounter (Signed)
Message copied by Illene Regulus on Thu Sep 12, 2013  1:45 PM ------      Message from: Adin Hector      Created: Thu Sep 12, 2013  7:18 AM       Pathology shows benign results: Chronic cholecystitis (chronically irritated gallbladder)              Sterlin Knightly, CCS MA, please call on the patient to make sure recovery is going well & tell pt the good news on the pathology.  Thanks,            Adin Hector, M.D., F.A.C.S.      Gastrointestinal and Minimally Invasive Surgery      [facility]      [address], [place]      [place], [place] [address]      [phone] Main / Paging            ! ------

## 2013-09-12 NOTE — Telephone Encounter (Signed)
Called pt to notify her that the pathology report shows chronic cholecystitis per Dr Johney Maine. I made the pt a f/u appt with Dr Johney Maine for 10/02/13.

## 2013-09-12 NOTE — Telephone Encounter (Signed)
Message copied by Illene Regulus on Thu Sep 12, 2013  1:43 PM ------      Message from: Adin Hector      Created: Thu Sep 12, 2013  7:18 AM       Pathology shows benign results: Chronic cholecystitis (chronically irritated gallbladder)              Nykiah Ma, CCS MA, please call on the patient to make sure recovery is going well & tell pt the good news on the pathology.  Thanks,            Adin Hector, M.D., F.A.C.S.      Gastrointestinal and Minimally Invasive Surgery      [facility]      [address], [place]      [place], [place] [address]      [phone] Main / Paging            ! ------

## 2013-10-02 ENCOUNTER — Encounter (INDEPENDENT_AMBULATORY_CARE_PROVIDER_SITE_OTHER): Payer: Self-pay | Admitting: Surgery

## 2013-10-02 ENCOUNTER — Ambulatory Visit (INDEPENDENT_AMBULATORY_CARE_PROVIDER_SITE_OTHER): Payer: BC Managed Care – PPO | Admitting: Surgery

## 2013-10-02 VITALS — BP 110/70 | HR 80 | Temp 97.8°F | Resp 16 | Ht 65.0 in | Wt 122.8 lb

## 2013-10-02 DIAGNOSIS — K8010 Calculus of gallbladder with chronic cholecystitis without obstruction: Secondary | ICD-10-CM

## 2013-10-02 NOTE — Patient Instructions (Signed)
LAPAROSCOPIC SURGERY: POST OP INSTRUCTIONS  1. DIET: Follow a light bland diet the first 24 hours after arrival home, such as soup, liquids, crackers, etc.  Be sure to include lots of fluids daily.  Avoid fast food or heavy meals as your are more likely to get nauseated.  Eat a low fat the next few days after surgery.   2. Take your usually prescribed home medications unless otherwise directed. 3. PAIN CONTROL: a. Pain is best controlled by a usual combination of three different methods TOGETHER: i. Ice/Heat ii. Over the counter pain medication iii. Prescription pain medication b. Most patients will experience some swelling and bruising around the incisions.  Ice packs or heating pads (30-60 minutes up to 6 times a day) will help. Use ice for the first few days to help decrease swelling and bruising, then switch to heat to help relax tight/sore spots and speed recovery.  Some people prefer to use ice alone, heat alone, alternating between ice & heat.  Experiment to what works for you.  Swelling and bruising can take several weeks to resolve.   c. It is helpful to take an over-the-counter pain medication regularly for the first few weeks.  Choose one of the following that works best for you: i. Naproxen (Aleve, etc)  Two 236m tabs twice a day ii. Ibuprofen (Advil, etc) Three 2060mtabs four times a day (every meal & bedtime) iii. Acetaminophen (Tylenol, etc) 500-65025mour times a day (every meal & bedtime) d. A  prescription for pain medication (such as oxycodone, hydrocodone, etc) should be given to you upon discharge.  Take your pain medication as prescribed.  i. If you are having problems/concerns with the prescription medicine (does not control pain, nausea, vomiting, rash, itching, etc), please call us [phone] see if we need to switch you to a different pain medicine that will work better for you and/or control your side effect better. ii. If you need a refill on your pain medication,  please contact your pharmacy.  They will contact our office to request authorization. Prescriptions will not be filled after 5 pm or on week-ends. 4. Avoid getting constipated.  Between the surgery and the pain medications, it is common to experience some constipation.  Increasing fluid intake and taking a fiber supplement (such as Metamucil, Citrucel, FiberCon, MiraLax, etc) 1-2 times a day regularly will usually help prevent this problem from occurring.  A mild laxative (prune juice, Milk of Magnesia, MiraLax, etc) should be taken according to package directions if there are no bowel movements after 48 hours.   5. Watch out for diarrhea.  If you have many loose bowel movements, simplify your diet to bland foods & liquids for a few days.  Stop any stool softeners and decrease your fiber supplement.  Switching to mild anti-diarrheal medications (Kayopectate, Pepto Bismol) can help.  If this worsens or does not improve, please call us.[place]. Wash / shower every day.  You may shower over the dressings as they are waterproof.  Continue to shower over incision(s) after the dressing is off. 7. Remove your waterproof bandages 5 days after surgery.  You may leave the incision open to air.  You may replace a dressing/Band-Aid to cover the incision for comfort if you wish.  8. ACTIVITIES as tolerated:   a. You may resume regular (light) daily activities beginning the next day-such as daily self-care, walking, climbing stairs-gradually increasing activities as tolerated.  If you can walk 30 minutes without difficulty, it  is safe to try more intense activity such as jogging, treadmill, bicycling, low-impact aerobics, swimming, etc. b. Save the most intensive and strenuous activity for last such as sit-ups, heavy lifting, contact sports, etc  Refrain from any heavy lifting or straining until you are off narcotics for pain control.   c. DO NOT PUSH THROUGH PAIN.  Let pain be your guide: If it hurts to do something, don't do  it.  Pain is your body warning you to avoid that activity for another week until the pain goes down. d. You may drive when you are no longer taking prescription pain medication, you can comfortably wear a seatbelt, and you can safely maneuver your car and apply brakes. e. Dennis Bast may have sexual intercourse when it is comfortable.  9. FOLLOW UP in our office a. Please call [facility] at [phone] to set up an appointment to see your surgeon in the office for a follow-up appointment approximately 2-3 weeks after your surgery. b. Make sure that you call for this appointment the day you arrive home to insure a convenient appointment time. 10. IF YOU HAVE DISABILITY OR FAMILY LEAVE FORMS, BRING THEM TO THE OFFICE FOR PROCESSING.  DO NOT GIVE THEM TO YOUR DOCTOR.   WHEN TO CALL us [phone]: 1. Poor pain control 2. Reactions / problems with new medications (rash/itching, nausea, etc)  3. Fever over 101.5 F (38.5 C) 4. Inability to urinate 5. Nausea and/or vomiting 6. Worsening swelling or bruising 7. Continued bleeding from incision. 8. Increased pain, redness, or drainage from the incision   The clinic staff is available to answer your questions during regular business hours (8:30am-5pm).  Please don't hesitate to call and ask to speak to one of our nurses for clinical concerns.   If you have a medical emergency, go to the nearest emergency room or call 911.  A surgeon from [facility] is always on call at the [facility], [place], [place], [place], [place]  [address] ? MAIN: [phone] ? TOLL FREE: [phone] ?  FAX [phone] [email or web address]  GETTING TO GOOD BOWEL HEALTH. Irregular bowel habits such as constipation and diarrhea can lead to many problems over time.  Having one soft bowel movement a day is the most important way to prevent further problems.  The anorectal canal is designed to handle stretching  and feces to safely manage our ability to get rid of solid waste (feces, poop, stool) out of our body.  BUT, hard constipated stools can act like ripping concrete bricks and diarrhea can be a burning fire to this very sensitive area of our body, causing inflamed hemorrhoids, anal fissures, increasing risk is perirectal abscesses, abdominal pain/bloating, an making irritable bowel worse.     The goal: ONE SOFT BOWEL MOVEMENT A DAY!  To have soft, regular bowel movements:    Drink at least 8 tall glasses of water a day.     Take plenty of fiber.  Fiber is the undigested part of plant food that passes into the colon, acting s "natures broom" to encourage bowel motility and movement.  Fiber can absorb and hold large amounts of water. This results in a larger, bulkier stool, which is soft and easier to pass. Work gradually over several weeks up to 6 servings a day of fiber (25g a day even more if needed) in the form of: o Vegetables -- Root (potatoes, carrots, turnips), leafy green (lettuce, salad greens, celery,  spinach), or cooked high residue (cabbage, broccoli, etc) o Fruit -- Fresh (unpeeled skin & pulp), Dried (prunes, apricots, cherries, etc ),  or stewed ( applesauce)  o Whole grain breads, pasta, etc (whole wheat)  o Bran cereals    Bulking Agents -- This type of water-retaining fiber generally is easily obtained each day by one of the following:  o Psyllium bran -- The psyllium plant is remarkable because its ground seeds can retain so much water. This product is available as Metamucil, Konsyl, Effersyllium, Per Diem Fiber, or the less expensive generic preparation in drug and health food stores. Although labeled a laxative, it really is not a laxative.  o Methylcellulose -- This is another fiber derived from wood which also retains water. It is available as Citrucel. o Polyethylene Glycol - and "artificial" fiber commonly called Miralax or Glycolax.  It is helpful for people with gassy or bloated  feelings with regular fiber o Flax Seed - a less gassy fiber than psyllium   No reading or other relaxing activity while on the toilet. If bowel movements take longer than 5 minutes, you are too constipated   AVOID CONSTIPATION.  High fiber and water intake usually takes care of this.  Sometimes a laxative is needed to stimulate more frequent bowel movements, but    Laxatives are not a good long-term solution as it can wear the colon out. o Osmotics (Milk of Magnesia, Fleets phosphosoda, Magnesium citrate, MiraLax, GoLytely) are safer than  o Stimulants (Senokot, Castor Oil, Dulcolax, Ex Lax)    o Do not take laxatives for more than 7days in a row.    IF SEVERELY CONSTIPATED, try a Bowel Retraining Program: o Do not use laxatives.  o Eat a diet high in roughage, such as bran cereals and leafy vegetables.  o Drink six (6) ounces of prune or apricot juice each morning.  o Eat two (2) large servings of stewed fruit each day.  o Take one (1) heaping tablespoon of a psyllium-based bulking agent twice a day. Use sugar-free sweetener when possible to avoid excessive calories.  o Eat a normal breakfast.  o Set aside 15 minutes after breakfast to sit on the toilet, but do not strain to have a bowel movement.  o If you do not have a bowel movement by the third day, use an enema and repeat the above steps.    Controlling diarrhea o Switch to liquids and simpler foods for a few days to avoid stressing your intestines further. o Avoid dairy products (especially milk & ice cream) for a short time.  The intestines often can lose the ability to digest lactose when stressed. o Avoid foods that cause gassiness or bloating.  Typical foods include beans and other legumes, cabbage, broccoli, and dairy foods.  Every person has some sensitivity to other foods, so listen to our body and avoid those foods that trigger problems for you. o Adding fiber (Citrucel, Metamucil, psyllium, Miralax) gradually can help thicken  stools by absorbing excess fluid and retrain the intestines to act more normally.  Slowly increase the dose over a few weeks.  Too much fiber too soon can backfire and cause cramping & bloating. o Probiotics (such as active yogurt, Align, etc) may help repopulate the intestines and colon with normal bacteria and calm down a sensitive digestive tract.  Most studies show it to be of mild help, though, and such products can be costly. o Medicines:   Bismuth subsalicylate (ex. Kayopectate, [place]) every  30 minutes for up to 6 doses can help control diarrhea.  Avoid if pregnant.   Loperamide (Immodium) can slow down diarrhea.  Start with two tablets (4mg  total) first and then try one tablet every 6 hours.  Avoid if you are having fevers or severe pain.  If you are not better or start feeling worse, stop all medicines and call your doctor for advice o Call your doctor if you are getting worse or not better.  Sometimes further testing (cultures, endoscopy, X-ray studies, bloodwork, etc) may be needed to help diagnose and treat the cause of the diarrhea. o   Cholecystitis Cholecystitis is an inflammation of your gallbladder. It is usually caused by a buildup of gallstones or sludge (cholelithiasis) in your gallbladder. The gallbladder stores a fluid that helps digest fats (bile). Cholecystitis is serious and needs treatment right away.  CAUSES   Gallstones. Gallstones can block the tube that leads to your gallbladder, causing bile to build up. As bile builds up, the gallbladder becomes inflamed.  Bile duct problems, such as blockage from scarring or kinking.  Tumors. Tumors can stop bile from leaving your gallbladder correctly, causing bile to build up. As bile builds up, the gallbladder becomes inflamed. SYMPTOMS   Nausea.  Vomiting.  Abdominal pain, especially in the upper right area of your abdomen.  Abdominal tenderness or bloating.  Sweating.  Chills.  Fever.  Yellowing of the  skin and the whites of the eyes (jaundice). DIAGNOSIS  Your caregiver may order blood tests to look for infection or gallbladder problems. Your caregiver may also order imaging tests, such as an ultrasound or computed tomography (CT) scan. Further tests may include a hepatobiliary iminodiacetic acid (HIDA) scan. This scan allows your caregiver to see your bile move from the liver to the gallbladder and to the small intestine. TREATMENT  A hospital stay is usually necessary to lessen the inflammation of your gallbladder. You may be required to not eat or drink (fast) for a certain amount of time. You may be given medicine to treat pain or an antibiotic medicine to treat an infection. Surgery may be needed to remove your gallbladder (cholecystectomy) once the inflammation has gone down. Surgery may be needed right away if you develop complications such as death of gallbladder tissue (gangrene) or a tear (perforation) of the gallbladder.  [place] care will depend on your treatment. In general:  If you were given antibiotics, take them as directed. Finish them even if you start to feel better.  Only take over-the-counter or prescription medicines for pain, discomfort, or fever as directed by your caregiver.  Follow a low-fat diet until you see your caregiver again.  Keep all follow-up visits as directed by your caregiver. SEEK IMMEDIATE MEDICAL CARE IF:   Your pain is increasing and not controlled by medicines.  Your pain moves to another part of your abdomen or to your back.  You have a fever.  You have nausea and vomiting. MAKE SURE YOU:  Understand these instructions.  Will watch your condition.  Will get help right away if you are not doing well or get worse. Document Released: 04/25/2005 Document Revised: 07/18/2011 Document Reviewed: 03/11/2011 [facility] Patient Information 2014 [place], [place].

## 2013-10-02 NOTE — Progress Notes (Signed)
Subjective:     Patient ID: Shelby Campbell, female   DOB: 02-14-1976, 38 y.o.   MRN: [id number]  HPI  Note: This dictation was prepared with Dragon/digital dictation along with [facility] technology. Any transcriptional errors that result from this process are unintentional.       Shelby Campbell  [birth year] [id number]  Patient Care Team: [name], MD as PCP - General (Internal Medicine) Missy Sabins, MD as Consulting Physician (Gastroenterology) Adin Hector, MD as Consulting Physician (General Surgery)  Procedure (Date: 09/10/2013):  POST-OPERATIVE DIAGNOSIS: symptomatic biliary colic, probable chronic cholecystitis  PROCEDURE: Procedure(s):   LAPAROSCOPIC CHOLECYSTECTOMY SINGLE SITE WITH INTRAOPERATIVE CHOLANGIOGRAM   SURGEON: Surgeon(s):  Adin Hector, MD   Diagnosis Gallbladder CHRONIC CHOLECYSTITIS AND CHOLELITHIASIS. Aldona Bar MD Pathologist, Electronic Signature (Case signed 09/11/2013   This patient returns for surgical re-evaluation.  She is feeling better.  She is surprised that her food tolerances have improved.  No more N/V.  No fevers or chills.  Tolerated ice cream and shrimp which normally would have set her off with diarrhea, nausea, etc.  No bad postprandial diarrhea.  That is happy surprise for her.  Minimal soreness at her bellybutton.  Glad to have just one incision.  In good spirits.  Patient Active Problem List   Diagnosis Date Noted  . Chronic gastritis, mild.  H Pylori negative 07/31/2013  . Chronic cholecystitis with calculus 07/31/2013    Past Medical History  Diagnosis Date  . Hypertension   . Porphyria   . Asthma   . Hiatal hernia   . Mitral valve prolapse   . Shortness of breath     related to asthma   . Porphyria   . Chronic kidney disease     sees Dr Erling Cruz- stage III per note     Past Surgical History  Procedure Laterality Date  . Axillary lymph node biopsy  2010    right   . Tubal ligation    .  Laparoscopic cholecystectomy single port N/A 09/10/2013    Procedure: LAPAROSCOPIC CHOLECYSTECTOMY SINGLE SITE WITH CHOLANGIOGRAM;  Surgeon: Adin Hector, MD;  Location: [facility];  Service: General;  Laterality: N/A;  . Cholecystectomy      History   Social History  . Marital Status: Married    Spouse Name: N/A    Number of Children: N/A  . Years of Education: N/A   Occupational History  . Not on file.   Social History Main Topics  . Smoking status: Never Smoker   . Smokeless tobacco: Never Used  . Alcohol Use: Yes     Comment: occasional  . Drug Use: No  . Sexual Activity: Yes   Other Topics Concern  . Not on file   Social History Narrative  . No narrative on file    History reviewed. No pertinent family history.  Current Outpatient Prescriptions  Medication Sig Dispense Refill  . albuterol (PROVENTIL HFA;VENTOLIN HFA) 108 (90 BASE) MCG/ACT inhaler Inhale 2 puffs into the lungs every 6 (six) hours as needed for wheezing or shortness of breath.      Marland Kitchen atenolol (TENORMIN) 50 MG tablet Take 50 mg by mouth every evening.      . beclomethasone (QVAR) 40 MCG/ACT inhaler Inhale 2 puffs into the lungs 2 (two) times daily.      . cholecalciferol (VITAMIN D) 1000 UNITS tablet Take 1,000 Units by mouth daily.      Marland Kitchen lisinopril-hydrochlorothiazide (PRINZIDE,ZESTORETIC) 20-12.5 MG per tablet Take  1 tablet by mouth every evening.        No current facility-administered medications for this visit.     Allergies  Allergen Reactions  . Other Other (See Comments)    Sulfonamides, sulfonylureas, barbiturates, antifungals, ketamine, etomidate, rifapentine, rifampicin, rifabutine, nitrofurantoin, metronidazole, ergot derivatives, indinavir, nevirapine, ritonavir, saquinavir, progestogens, progesterones, carbamazepine, phenytoin, valproate, oxycodone, pentazocine, cocaine, gold antirheumatics agents, sodium aurothiomalate, methyldopa, fenfluramine, ethosuzimide, flupentixol, flutamide,  disulfiram, lidocaine   . Sulfa Antibiotics Other (See Comments)    Heart beating fast     BP 110/70  Pulse 80  Temp(Src) 97.8 F (36.6 C) (Temporal)  Resp 16  Ht 5\' 5"  (1.651 m)  Wt 122 lb 12.8 oz (55.702 kg)  BMI 20.44 kg/m2  Dg Cholangiogram Operative  09/10/2013   CLINICAL DATA:  lap chole right upper abdominal pain  EXAM: INTRAOPERATIVE CHOLANGIOGRAM  TECHNIQUE: Cholangiographic images from the C-arm fluoroscopic device were submitted for interpretation post-operatively. Please see the procedural report for the amount of contrast and the fluoroscopy time utilized.  COMPARISON:  None.  FINDINGS: No persistent filling defects in the common duct. Intrahepatic ducts are incompletely visualized, appearing decompressed centrally. Contrast passes into the duodenum.  : Negative for retained common duct stone.   Electronically Signed   By: Arne Cleveland M.D.   On: 09/10/2013 12:32     Review of Systems  Constitutional: Negative for fever, chills and diaphoresis.  HENT: Negative for ear pain, sore throat and trouble swallowing.   Eyes: Negative for photophobia and visual disturbance.  Respiratory: Negative for cough and choking.   Cardiovascular: Negative for chest pain and palpitations.  Gastrointestinal: Negative for nausea, vomiting, abdominal pain, diarrhea, constipation, anal bleeding and rectal pain.  Genitourinary: Negative for dysuria, frequency and difficulty urinating.  Musculoskeletal: Negative for gait problem and myalgias.  Skin: Negative for color change, pallor and rash.  Neurological: Negative for dizziness, speech difficulty, weakness and numbness.  Hematological: Negative for adenopathy.  Psychiatric/Behavioral: Negative for confusion and agitation. The patient is not nervous/anxious.        Objective:   Physical Exam  Constitutional: She is oriented to person, place, and time. She appears well-developed and well-nourished. No distress.  HENT:  Head:  Normocephalic.  Mouth/Throat: Oropharynx is clear and moist. No oropharyngeal exudate.  Eyes: Conjunctivae and EOM are normal. Pupils are equal, round, and reactive to light. No scleral icterus.  Neck: Normal range of motion. No tracheal deviation present.  Cardiovascular: Normal rate and intact distal pulses.   Pulmonary/Chest: Effort normal. No respiratory distress. She exhibits no tenderness.  Abdominal: Soft. She exhibits no distension. There is no tenderness. Hernia confirmed negative in the right inguinal area and confirmed negative in the left inguinal area.  Incisions clean with normal healing ridges.  No hernias  Genitourinary: No vaginal discharge found.  Musculoskeletal: Normal range of motion. She exhibits no tenderness.  Lymphadenopathy:       Right: No inguinal adenopathy present.       Left: No inguinal adenopathy present.  Neurological: She is alert and oriented to person, place, and time. No cranial nerve deficit. She exhibits normal muscle tone. Coordination normal.  Skin: Skin is warm and dry. No rash noted. She is not diaphoretic.  Psychiatric: She has a normal mood and affect. Her behavior is normal.       Assessment:     Recovering markedly well status post cholecystectomy     Plan:     Increase activity as tolerated to regular activity.  Low impact exercise such as walking an hour a day at least ideal.  Do not push through pain.  Diet as tolerated.  Low fat high fiber diet ideal.  Bowel regimen with 30 g fiber a day and fiber supplement as needed to avoid problems.  Return to clinic as needed.   Instructions discussed.  Followup with primary care physician for other health issues as would normally be done.  Consider screening for malignancies (breast, prostate, colon, melanoma, etc) as appropriate.  Questions answered.  The patient expressed understanding and appreciation

## 2016-04-07 ENCOUNTER — Other Ambulatory Visit: Payer: Self-pay | Admitting: Obstetrics and Gynecology

## 2016-04-21 ENCOUNTER — Encounter (HOSPITAL_COMMUNITY): Payer: Self-pay | Admitting: *Deleted

## 2016-04-26 NOTE — Patient Instructions (Addendum)
Your procedure is scheduled on:  Wednesday, Dec. 27, 2017  Enter through the [employer] [facility] at:  [place] up the phone at the desk and dial [phone].  Call this number if you have problems the morning of surgery: [phone].  Remember: Do NOT eat food:  After Midnight Tuesday  Do NOT drink clear liquids after:  9:30 AM day of surgery  Take these medicines the morning of surgery with a SIP OF WATER:  Lisinopril  Stop ALL herbal medications at this time   Do NOT wear jewelry (body piercing), metal hair clips/bobby pins, make-up, or nail polish. Do NOT wear lotions, powders, or perfumes.  You may wear deodorant. Do NOT shave for 48 hours prior to surgery. Do NOT bring valuables to the hospital. Contacts, dentures, or bridgework may not be worn into surgery.  Have a responsible adult drive you home and stay with you for 24 hours after your procedure

## 2016-04-27 ENCOUNTER — Encounter (HOSPITAL_COMMUNITY)
Admission: RE | Admit: 2016-04-27 | Discharge: 2016-04-27 | Disposition: A | Discharge status: Home / Self Care | Payer: BC Managed Care – PPO | Source: Ambulatory Visit | Attending: Obstetrics and Gynecology | Admitting: Obstetrics and Gynecology

## 2016-04-27 ENCOUNTER — Encounter (HOSPITAL_COMMUNITY): Payer: Self-pay

## 2016-04-27 DIAGNOSIS — Z01812 Encounter for preprocedural laboratory examination: Secondary | ICD-10-CM | POA: Diagnosis not present

## 2016-04-27 HISTORY — DX: Headache: R51

## 2016-04-27 LAB — CBC
HCT: 30.2 % — ABNORMAL LOW (ref 36.0–46.0)
HEMOGLOBIN: 9.2 g/dL — AB (ref 12.0–15.0)
MCH: 24.1 pg — ABNORMAL LOW (ref 26.0–34.0)
MCHC: 30.5 g/dL (ref 30.0–36.0)
MCV: 79.3 fL (ref 78.0–100.0)
Platelets: 315 10*3/uL (ref 150–400)
RBC: 3.81 MIL/uL — ABNORMAL LOW (ref 3.87–5.11)
RDW: 18.1 % — AB (ref 11.5–15.5)
WBC: 5.1 10*3/uL (ref 4.0–10.5)

## 2016-04-27 LAB — BASIC METABOLIC PANEL
Anion gap: 6 (ref 5–15)
BUN: 28 mg/dL — AB (ref 6–20)
CO2: 25 mmol/L (ref 22–32)
CREATININE: 1.47 mg/dL — AB (ref 0.44–1.00)
Calcium: 9.4 mg/dL (ref 8.9–10.3)
Chloride: 106 mmol/L (ref 101–111)
GFR calc Af Amer: 51 mL/min — ABNORMAL LOW (ref >60)
GFR calc non Af Amer: 44 mL/min — ABNORMAL LOW (ref >60)
Glucose, Bld: 84 mg/dL (ref 65–99)
Potassium: 3.8 mmol/L (ref 3.5–5.1)
SODIUM: 137 mmol/L (ref 135–145)

## 2016-05-04 ENCOUNTER — Ambulatory Visit (HOSPITAL_COMMUNITY): Payer: BC Managed Care – PPO | Admitting: Anesthesiology

## 2016-05-04 ENCOUNTER — Encounter (HOSPITAL_COMMUNITY)
Admission: RE | Disposition: A | Discharge status: Home / Self Care | Payer: Self-pay | Source: Ambulatory Visit | Attending: Obstetrics and Gynecology

## 2016-05-04 ENCOUNTER — Encounter (HOSPITAL_COMMUNITY): Payer: Self-pay

## 2016-05-04 ENCOUNTER — Ambulatory Visit (HOSPITAL_COMMUNITY)
Admission: RE | Admit: 2016-05-04 | Discharge: 2016-05-04 | Disposition: A | Discharge status: Home / Self Care | Payer: BC Managed Care – PPO | Source: Ambulatory Visit | Attending: Obstetrics and Gynecology | Admitting: Obstetrics and Gynecology

## 2016-05-04 DIAGNOSIS — Z9851 Tubal ligation status: Secondary | ICD-10-CM | POA: Insufficient documentation

## 2016-05-04 DIAGNOSIS — I129 Hypertensive chronic kidney disease with stage 1 through stage 4 chronic kidney disease, or unspecified chronic kidney disease: Secondary | ICD-10-CM | POA: Insufficient documentation

## 2016-05-04 DIAGNOSIS — J45909 Unspecified asthma, uncomplicated: Secondary | ICD-10-CM | POA: Diagnosis not present

## 2016-05-04 DIAGNOSIS — N183 Chronic kidney disease, stage 3 (moderate): Secondary | ICD-10-CM | POA: Insufficient documentation

## 2016-05-04 DIAGNOSIS — N920 Excessive and frequent menstruation with regular cycle: Secondary | ICD-10-CM | POA: Diagnosis not present

## 2016-05-04 HISTORY — PX: DILITATION & CURRETTAGE/HYSTROSCOPY WITH NOVASURE ABLATION: SHX5568

## 2016-05-04 SURGERY — DILATATION & CURETTAGE/HYSTEROSCOPY WITH NOVASURE ABLATION
Anesthesia: General | Site: Uterus

## 2016-05-04 MED ORDER — LACTATED RINGERS IV SOLN
INTRAVENOUS | Status: DC
  Administered 2016-05-04: 14:00:00 via INTRAVENOUS
  Administered 2016-05-04: 1000 mL via INTRAVENOUS

## 2016-05-04 MED ORDER — CHLOROPROCAINE HCL 1 % IJ SOLN
INTRAMUSCULAR | Status: AC
  Filled 2016-05-04: qty 30

## 2016-05-04 MED ORDER — ONDANSETRON HCL 4 MG/2ML IJ SOLN
INTRAMUSCULAR | Status: AC
  Filled 2016-05-04: qty 2

## 2016-05-04 MED ORDER — MEPERIDINE HCL 25 MG/ML IJ SOLN: 6.2500 mg | INTRAMUSCULAR | Status: DC | PRN

## 2016-05-04 MED ORDER — FENTANYL CITRATE (PF) 100 MCG/2ML IJ SOLN
INTRAMUSCULAR | Status: AC
  Filled 2016-05-04: qty 2

## 2016-05-04 MED ORDER — SCOPOLAMINE 1 MG/3DAYS TD PT72
1.0000 | MEDICATED_PATCH | Freq: Once | TRANSDERMAL | Status: DC
  Administered 2016-05-04: 1.5 mg via TRANSDERMAL

## 2016-05-04 MED ORDER — FENTANYL CITRATE (PF) 100 MCG/2ML IJ SOLN
INTRAMUSCULAR | Status: DC | PRN
  Administered 2016-05-04 (×2): 50 ug via INTRAVENOUS

## 2016-05-04 MED ORDER — PROPOFOL 10 MG/ML IV BOLUS
INTRAVENOUS | Status: AC
  Filled 2016-05-04: qty 20

## 2016-05-04 MED ORDER — SODIUM CHLORIDE 0.9 % IR SOLN
Status: DC | PRN
  Administered 2016-05-04: 3000 mL

## 2016-05-04 MED ORDER — ONDANSETRON HCL 4 MG/2ML IJ SOLN
INTRAMUSCULAR | Status: DC | PRN
  Administered 2016-05-04: 4 mg via INTRAVENOUS

## 2016-05-04 MED ORDER — PROMETHAZINE HCL 25 MG/ML IJ SOLN: 6.2500 mg | INTRAMUSCULAR | Status: DC | PRN

## 2016-05-04 MED ORDER — MIDAZOLAM HCL 2 MG/2ML IJ SOLN
INTRAMUSCULAR | Status: AC
  Filled 2016-05-04: qty 2

## 2016-05-04 MED ORDER — PROPOFOL 10 MG/ML IV BOLUS
INTRAVENOUS | Status: DC | PRN
Start: 2016-05-04 — End: 2016-05-04
  Administered 2016-05-04: 150 mg via INTRAVENOUS
  Administered 2016-05-04: 50 mg via INTRAVENOUS

## 2016-05-04 MED ORDER — MIDAZOLAM HCL 2 MG/2ML IJ SOLN: 0.5000 mg | Freq: Once | INTRAMUSCULAR | Status: DC | PRN

## 2016-05-04 MED ORDER — SCOPOLAMINE 1 MG/3DAYS TD PT72
MEDICATED_PATCH | TRANSDERMAL | Status: AC
  Filled 2016-05-04: qty 1

## 2016-05-04 MED ORDER — FENTANYL CITRATE (PF) 100 MCG/2ML IJ SOLN: 25.0000 ug | INTRAMUSCULAR | Status: DC | PRN

## 2016-05-04 SURGICAL SUPPLY — 15 items
ABLATOR ENDOMETRIAL BIPOLAR (ABLATOR) ×3 IMPLANT
CANISTER SUCT 3000ML (MISCELLANEOUS) ×3 IMPLANT
CATH ROBINSON RED A/P 16FR (CATHETERS) ×3 IMPLANT
CLOTH BEACON ORANGE TIMEOUT ST (SAFETY) ×3 IMPLANT
CONTAINER PREFILL 10% NBF 60ML (FORM) ×3 IMPLANT
GLOVE BIOGEL PI IND STRL 7.0 (GLOVE) ×2 IMPLANT
GLOVE BIOGEL PI INDICATOR 7.0 (GLOVE) ×4
GLOVE ECLIPSE 6.5 STRL STRAW (GLOVE) ×3 IMPLANT
GOWN STRL REUS W/TWL LRG LVL3 (GOWN DISPOSABLE) ×6 IMPLANT
PACK VAGINAL MINOR WOMEN LF (CUSTOM PROCEDURE TRAY) ×3 IMPLANT
PAD OB MATERNITY 4.3X12.25 (PERSONAL CARE ITEMS) ×3 IMPLANT
TOWEL OR 17X24 6PK STRL BLUE (TOWEL DISPOSABLE) ×6 IMPLANT
TUBING AQUILEX INFLOW (TUBING) ×3 IMPLANT
TUBING AQUILEX OUTFLOW (TUBING) ×3 IMPLANT
WATER STERILE IRR 1000ML POUR (IV SOLUTION) ×3 IMPLANT

## 2016-05-04 NOTE — Anesthesia Postprocedure Evaluation (Signed)
Anesthesia Post Note  Patient: Shelby Campbell  Procedure(s) Performed: Procedure(s) (LRB): DILATATION & CURETTAGE/HYSTEROSCOPY WITH NOVASURE ABLATION (N/A)  Patient location during evaluation: PACU Anesthesia Type: General Level of consciousness: awake and alert Pain management: pain level controlled Vital Signs Assessment: post-procedure vital signs reviewed and stable Respiratory status: spontaneous breathing, nonlabored ventilation, respiratory function stable and patient connected to nasal cannula oxygen Cardiovascular status: blood pressure returned to baseline and stable Postop Assessment: no signs of nausea or vomiting Anesthetic complications: no        Last Vitals:  Vitals:   05/04/16 1430 05/04/16 1445  BP: 140/86 (!) 138/92  Pulse: (!) 51 (!) 50  Resp: 14 14  Temp:      Last Pain:  Vitals:   05/04/16 1209  TempSrc: Oral   Pain Goal: Patients Stated Pain Goal: 3 (05/04/16 1406)               Catalina Gravel

## 2016-05-04 NOTE — Transfer of Care (Signed)
Immediate Anesthesia Transfer of Care Note  Patient: Shelby Campbell  Procedure(s) Performed: Procedure(s): DILATATION & CURETTAGE/HYSTEROSCOPY WITH NOVASURE ABLATION (N/A)  Patient Location: PACU  Anesthesia Type:General  Level of Consciousness: awake, alert  and oriented  Airway & Oxygen Therapy: Patient Spontanous Breathing and Patient connected to nasal cannula oxygen  Post-op Assessment: Report given to RN and Post -op Vital signs reviewed and stable  Post vital signs: Reviewed and stable  Last Vitals:  Vitals:   05/04/16 1209  BP: 129/85  Pulse: 61  Resp: 16  Temp: 36.6 C    Last Pain:  Vitals:   05/04/16 1209  TempSrc: Oral         Complications: No apparent anesthesia complications

## 2016-05-04 NOTE — Discharge Instructions (Signed)
CALL  IF TEMP>100.4, NOTHING PER VAGINA X 1 WK, CALL IF SOAKING A MAXI  PAD EVERY HOUR OR MORE FREQUENTLY  DISCHARGE INSTRUCTIONS: HYSTEROSCOPY / ENDOMETRIAL ABLATION The following instructions have been prepared to help you care for yourself upon your return home.  May Remove Scop patch on or before Saturday 05/07/16.  Wash your hand with soap and water after any contact with the patch.  Personal hygiene:  Use sanitary pads for vaginal drainage, not tampons.  Shower the day after your procedure.  NO tub baths, pools or Jacuzzis for 2-3 weeks.  Wipe front to back after using the bathroom.  Activity and limitations:  Do NOT drive or operate any equipment for 24 hours. The effects of anesthesia are still present and drowsiness may result.  Do NOT rest in bed all day.  Walking is encouraged.  Walk up and down stairs slowly.  You may resume your normal activity in one to two days or as indicated by your physician. Sexual activity: NO intercourse for at least 1 weeks after the procedure.  Diet: Eat a light meal as desired this evening. You may resume your usual diet tomorrow.  Return to Work: You may resume your work activities in one to two days or as indicated by Marine scientist.  What to expect after your surgery: Expect to have vaginal bleeding/discharge for 2-3 days and spotting for up to 10 days. It is not unusual to have soreness for up to 1-2 weeks. You may have a slight burning sensation when you urinate for the first day. Mild cramps may continue for a couple of days. You may have a regular period in 2-6 weeks.  Call your doctor for any of the following:  Excessive vaginal bleeding or clotting, saturating and changing one pad every hour.  Inability to urinate 6 hours after discharge from hospital.  Pain not relieved by pain medication.  Fever of 100.4 F or greater.  Unusual vaginal discharge or odor.  Return to office _________________Call for an appointment  ___________________ Patients signature: ______________________ Nurses signature ________________________  [facility] [phone]

## 2016-05-04 NOTE — Anesthesia Procedure Notes (Signed)
Procedure Name: LMA Insertion Date/Time: 05/04/2016 1:35 PM Performed by: Jonna Munro Pre-anesthesia Checklist: Patient identified, Emergency Drugs available, Suction available, Patient being monitored and Timeout performed Patient Re-evaluated:Patient Re-evaluated prior to inductionOxygen Delivery Method: Circle system utilized Preoxygenation: Pre-oxygenation with 100% oxygen Intubation Type: IV induction Ventilation: Mask ventilation without difficulty LMA: LMA inserted LMA Size: 4.0 Number of attempts: 1 Placement Confirmation: positive ETCO2 and breath sounds checked- equal and bilateral Tube secured with: Tape Dental Injury: Teeth and Oropharynx as per pre-operative assessment

## 2016-05-04 NOTE — Brief Op Note (Signed)
05/04/2016  2:27 PM  PATIENT:  Shelby Campbell  40 y.o. female  PRE-OPERATIVE DIAGNOSIS:  menorrhagia with iregular cycle  POST-OPERATIVE DIAGNOSIS:  MENORRHAGIA WITH REGULAR CYCLE  PROCEDURE:  Dx hysteroscopy, D&C, Novasure endometrial ablation  SURGEON:  Surgeon(s) and Role:    * Servando Salina, MD - Primary  PHYSICIAN ASSISTANT:   ASSISTANTS: none   ANESTHESIA:   general  EBL:  Total I/O In: 1000 [I.V.:1000] Out: 105 [Urine:100; Blood:5]  BLOOD ADMINISTERED:none  DRAINS: none   LOCAL MEDICATIONS USED:  NONE  SPECIMEN:  Source of Specimen:  [facility]  DISPOSITION OF SPECIMEN:  PATHOLOGY  COUNTS:  YES  TOURNIQUET:  * No tourniquets in log *  DICTATION: .Other Dictation: Dictation Number [id number]  PLAN OF CARE: Discharge to home after PACU  PATIENT DISPOSITION:  PACU - hemodynamically stable.   Delay start of Pharmacological VTE agent (>24hrs) due to surgical blood loss or risk of bleeding: no

## 2016-05-04 NOTE — Anesthesia Preprocedure Evaluation (Addendum)
Anesthesia Evaluation  Patient identified by MRN, date of birth, ID band Patient awake    Reviewed: Allergy & Precautions, NPO status , Patient's Chart, lab work & pertinent test results  History of Anesthesia Complications Negative for: history of anesthetic complications  Airway Mallampati: I  TM Distance: >3 FB Neck ROM: Full    Dental  (+) Dental Advisory Given   Pulmonary asthma (last rescue inhaler a week ago) ,    breath sounds clear to auscultation       Cardiovascular hypertension, Pt. on medications and Pt. 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   Dental advisory given  Plan Discussed with: CRNA and Surgeon  Anesthesia Plan Comments: (Plan routine monitors, GA- LMA OK Avoid porphyria triggers)        Anesthesia Quick Evaluation

## 2016-05-05 ENCOUNTER — Encounter (HOSPITAL_COMMUNITY): Payer: Self-pay | Admitting: Obstetrics and Gynecology

## 2016-05-05 NOTE — Op Note (Signed)
NAME:  Shelby Campbell, Shelby Campbell NO.:  [id number]  MEDICAL RECORD NO.:  [id number]  LOCATION:                                 FACILITY:  PHYSICIAN:  Servando Salina, M.D.DATE OF BIRTH:  Sep 17, 1975  DATE OF PROCEDURE:  05/04/2016 DATE OF DISCHARGE:  05/04/2016                              OPERATIVE REPORT   PREOPERATIVE DIAGNOSIS:  Menorrhagia with regular cycles.  PROCEDURE:  Diagnostic hysteroscopy, dilation and curettage, NovaSure endometrial ablation.  POSTOPERATIVE DIAGNOSIS:  Menorrhagia with regular cycles.  ANESTHESIA:  General.  SURGEON:  Servando Salina, M.D.  ASSISTANT:  None.  DESCRIPTION OF PROCEDURE:  Under adequate general anesthesia, the patient was placed in dorsal lithotomy position.  She was sterilely prepped and draped in the usual fashion.  The bladder was catheterized for minimal urine.  Examination under anesthesia revealed a retroverted uterus.  No adnexal masses could be appreciated.  A bivalve speculum was placed in the vagina.  Single-tooth tenaculum was placed on the anterior lip of the cervix.  The cervix was then dilated.  The uterus sounded to 9 cm.  The endocervical canal was also measured and was found to be 4 cm leaving a uterine length of 5 cm.  At that point, the diagnostic hysteroscope was introduced into the uterine cavity.  Multiple endometrial thickening in different areas was noted.  At that point, the hysteroscope was removed.  The cavity was then gently curetted for  tissue.  Then, the diagnostic hysteroscope was inserted.  The cavity was inspected. No other findings were noted.  The NovaSure endometrial ablation apparatus was then inserted into the uterine cavity.  With testing, the opening width of 4 was noted.  Once the machine was enabled, using a power of 110, the endometrium was ablated for 1 minute and 23 seconds.  The diagnostic hysteroscope was then inserted after the removal of the NovaSure apparatus.  Good  ablation was noted throughout.  At that point, themprocedure was felt to be complete removing all instruments from the vagina.  SPECIMEN LABELED:  Endometrial curetting was sent to Pathology.  ESTIMATED BLOOD LOSS:  Minimal.  COMPLICATION:  None.  The patient tolerated the procedure well, was transferred to recovery in stable condition.     Servando Salina, M.D.   ______________________________ Servando Salina, M.D.    /MEDQ  D:  05/04/2016  T:  05/05/2016  Job:  [id number]

## 2017-06-14 LAB — BASIC METABOLIC PANEL
BUN: 20 (ref 4–21)
Creatinine: 1.2 — AB (ref 0.5–1.1)
GLUCOSE: 80
POTASSIUM: 4.2 (ref 3.4–5.3)
SODIUM: 139 (ref 137–147)

## 2017-06-14 LAB — HEPATIC FUNCTION PANEL
ALT: 21 (ref 7–35)
AST: 27 (ref 13–35)
Alkaline Phosphatase: 52 (ref 25–125)

## 2018-02-26 ENCOUNTER — Encounter: Payer: Self-pay | Admitting: Internal Medicine

## 2018-02-26 DIAGNOSIS — I129 Hypertensive chronic kidney disease with stage 1 through stage 4 chronic kidney disease, or unspecified chronic kidney disease: Secondary | ICD-10-CM | POA: Insufficient documentation

## 2018-03-01 ENCOUNTER — Ambulatory Visit: Payer: BC Managed Care – PPO | Admitting: Internal Medicine

## 2018-03-01 ENCOUNTER — Encounter: Payer: Self-pay | Admitting: Internal Medicine

## 2018-03-01 VITALS — BP 132/84 | HR 76 | Temp 98.0°F | Ht 65.0 in | Wt 131.8 lb

## 2018-03-01 DIAGNOSIS — Z0000 Encounter for general adult medical examination without abnormal findings: Secondary | ICD-10-CM | POA: Diagnosis not present

## 2018-03-01 DIAGNOSIS — I129 Hypertensive chronic kidney disease with stage 1 through stage 4 chronic kidney disease, or unspecified chronic kidney disease: Secondary | ICD-10-CM | POA: Diagnosis not present

## 2018-03-01 DIAGNOSIS — Z23 Encounter for immunization: Secondary | ICD-10-CM

## 2018-03-01 DIAGNOSIS — N182 Chronic kidney disease, stage 2 (mild): Secondary | ICD-10-CM | POA: Diagnosis not present

## 2018-03-01 DIAGNOSIS — J45909 Unspecified asthma, uncomplicated: Secondary | ICD-10-CM | POA: Insufficient documentation

## 2018-03-01 DIAGNOSIS — J4520 Mild intermittent asthma, uncomplicated: Secondary | ICD-10-CM | POA: Diagnosis not present

## 2018-03-01 LAB — CBC
HEMOGLOBIN: 12.5 g/dL (ref 11.1–15.9)
Hematocrit: 37.1 % (ref 34.0–46.6)
MCH: 30.4 pg (ref 26.6–33.0)
MCHC: 33.7 g/dL (ref 31.5–35.7)
MCV: 90 fL (ref 79–97)
Platelets: 251 10*3/uL (ref 150–450)
RBC: 4.11 x10E6/uL (ref 3.77–5.28)
RDW: 13.4 % (ref 12.3–15.4)
WBC: 4.6 10*3/uL (ref 3.4–10.8)

## 2018-03-01 LAB — CMP14+EGFR
ALBUMIN: 4.3 g/dL (ref 3.5–5.5)
ALT: 19 IU/L (ref 0–32)
AST: 25 IU/L (ref 0–40)
Albumin/Globulin Ratio: 1.5 (ref 1.2–2.2)
Alkaline Phosphatase: 58 IU/L (ref 39–117)
BUN / CREAT RATIO: 19 (ref 9–23)
BUN: 27 mg/dL — AB (ref 6–24)
Bilirubin Total: 0.6 mg/dL (ref 0.0–1.2)
CALCIUM: 9.4 mg/dL (ref 8.7–10.2)
CO2: 20 mmol/L (ref 20–29)
CREATININE: 1.41 mg/dL — AB (ref 0.57–1.00)
Chloride: 101 mmol/L (ref 96–106)
GFR calc Af Amer: 54 mL/min/{1.73_m2} — ABNORMAL LOW (ref >59)
GFR, EST NON AFRICAN AMERICAN: 47 mL/min/{1.73_m2} — AB (ref >59)
GLUCOSE: 77 mg/dL (ref 65–99)
Globulin, Total: 2.9 g/dL (ref 1.5–4.5)
Potassium: 4.4 mmol/L (ref 3.5–5.2)
Sodium: 136 mmol/L (ref 134–144)
Total Protein: 7.2 g/dL (ref 6.0–8.5)

## 2018-03-01 LAB — POCT URINALYSIS DIPSTICK
Bilirubin, UA: NEGATIVE
Blood, UA: NEGATIVE
Glucose, UA: NEGATIVE
Ketones, UA: NEGATIVE
LEUKOCYTES UA: NEGATIVE
NITRITE UA: NEGATIVE
PROTEIN UA: NEGATIVE
Spec Grav, UA: 1.025 (ref 1.010–1.025)
UROBILINOGEN UA: 0.2 U/dL
pH, UA: 6.5 (ref 5.0–8.0)

## 2018-03-01 LAB — POCT UA - MICROALBUMIN
CREATININE, POC: 300 mg/dL
Microalbumin Ur, POC: 10 mg/L

## 2018-03-01 LAB — LIPID PANEL
Chol/HDL Ratio: 2.1 ratio (ref 0.0–4.4)
Cholesterol, Total: 158 mg/dL (ref 100–199)
HDL: 76 mg/dL (ref >39)
LDL Calculated: 71 mg/dL (ref 0–99)
Triglycerides: 53 mg/dL (ref 0–149)
VLDL Cholesterol Cal: 11 mg/dL (ref 5–40)

## 2018-03-01 LAB — HEMOGLOBIN A1C
ESTIMATED AVERAGE GLUCOSE: 111 mg/dL
HEMOGLOBIN A1C: 5.5 % (ref 4.8–5.6)

## 2018-03-01 MED ORDER — FLUTICASONE FUROATE-VILANTEROL 100-25 MCG/INH IN AEPB: 1.0000 | INHALATION_SPRAY | Freq: Every day | RESPIRATORY_TRACT | Status: DC

## 2018-03-01 NOTE — Patient Instructions (Signed)

## 2018-03-01 NOTE — Progress Notes (Addendum)
Subjective:     Patient ID: Shelby Campbell , female    DOB: 02/21/1976 , 42 y.o.   MRN: [id number]    No LMP recorded. Patient has had an ablation.. Last mammogram Sept 2019.  Negative for: breast discharge, breast lump(s), breast pain and breast self exam. Associated symptoms include abnormal vaginal bleeding. Pertinent negatives include abnormal bleeding (hematology), anxiety, decreased libido, depression, difficulty falling sleep, dyspareunia, history of infertility, nocturia, sexual dysfunction, sleep disturbances, urinary incontinence, urinary urgency, vaginal discharge and vaginal itching. Diet regular.The patient states her exercise level is  moderate/high intensity   . The patient's tobacco use is:  Social History   Tobacco Use  Smoking Status Never Smoker  Smokeless Tobacco Never Used  . She has been exposed to passive smoke. The patient's alcohol use is:  Social History   Substance and Sexual Activity  Alcohol Use Yes   Comment: occasional  . Additional information: Last pap Sept 2019 along with mammogram at Dr. Garwin Brothers office.    She is here today for a full physical examination. She is followed by Dr. Garwin Brothers for her GYN care.   Hypertension  This is a chronic problem. The current episode started more than 1 year ago. The problem is uncontrolled. The current treatment provides moderate improvement. There are no compliance problems.   She reports having elevated readings at home. Currently taking 1/2 lisinopril/hctz daily and 1/2 metoprolol nightly. Taking a whole pill of lisinopril/hctz has caused dizziness in the past.    Past Medical History:  Diagnosis Date  . Asthma   . Chronic kidney disease    sees Dr Erling Cruz- stage III per note   . Headache    Migraines  . Hiatal hernia   . Hypertension   . Mitral valve prolapse   . 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 Shortness of breath    related to asthma       Current Outpatient Medications:  .  albuterol  (PROVENTIL HFA;VENTOLIN HFA) 108 (90 BASE) MCG/ACT inhaler, Inhale 2 puffs into the lungs every 6 (six) hours as needed for wheezing or shortness of breath., Disp: , Rfl:  .  cholecalciferol (VITAMIN D) 1000 UNITS tablet, Take 1,000 Units by mouth daily., Disp: , Rfl:  .  levocetirizine (XYZAL) 5 MG tablet, Take 5 mg by mouth every evening., Disp: , Rfl:  .  lisinopril-hydrochlorothiazide (PRINZIDE,ZESTORETIC) 20-12.5 MG per tablet, Take 1 tablet by mouth every evening. , Disp: , Rfl:  .  metoprolol succinate (TOPROL-XL) 25 MG 24 hr tablet, Take 25 mg by mouth daily., Disp: , Rfl:    Allergies  Allergen Reactions  . Other Other (See Comments)    Sulfonamides, sulfonylureas, barbiturates, antifungals, ketamine, etomidate, rifapentine, rifampicin, rifabutine, nitrofurantoin, metronidazole, ergot derivatives, indinavir, nevirapine, ritonavir, saquinavir, progestogens, progesterones, carbamazepine, phenytoin, valproate, oxycodone, pentazocine, cocaine, gold antirheumatics agents, sodium aurothiomalate, methyldopa, fenfluramine, ethosuzimide, flupentixol, flutamide, disulfiram, lidocaine   . Sulfa Antibiotics Other (See Comments)    Heart beating fast      Review of Systems  Constitutional: Negative.   HENT: Negative.   Eyes: Negative.   Respiratory: Negative.   Cardiovascular: Negative.   Gastrointestinal: Negative.   Endocrine: Negative.   Genitourinary: Negative.   Skin: Negative.   Neurological: Negative.   Psychiatric/Behavioral: Negative.      Today's Vitals   03/01/18 0909  BP: 132/84  Pulse: 76  Temp: 98 F (36.7 C)  TempSrc: Oral  Weight: 131 lb 12.8 oz (59.8 kg)  Height: 5' 5"  (1.651 m)   Body mass index is 21.93 kg/m.   Objective:  Physical Exam  Constitutional: She is oriented to person, place, and time. She appears well-developed and well-nourished.  HENT:  Head: Normocephalic and atraumatic.  Right Ear: External ear normal.  Left Ear: External ear normal.   Nose: Nose normal.  Mouth/Throat: Oropharynx is clear and moist.  Eyes: Pupils are equal, round, and reactive to light. Conjunctivae and EOM are normal.  Neck: Normal range of motion. Neck supple.  Cardiovascular: Normal rate, regular rhythm, normal heart sounds and intact distal pulses.  Pulmonary/Chest: Effort normal and breath sounds normal. Right breast exhibits no inverted nipple, no mass, no nipple discharge, no skin change and no tenderness. Left breast exhibits no inverted nipple, no mass, no nipple discharge, no skin change and no tenderness.  Abdominal: Soft. Bowel sounds are normal.  Genitourinary:  Genitourinary Comments: deferred  Musculoskeletal: Normal range of motion.  Neurological: She is alert and oriented to person, place, and time.  Skin: Skin is warm and dry.  Psychiatric: She has a normal mood and affect.  Nursing note and vitals reviewed.       Assessment And Plan:   1. Routine general medical examination at health care facility  A full exam was performed. Importance of monthly self breast exams was discussed with the patient.   PATIENT HAS BEEN ADVISED TO GET 30-45 MINUTES REGULAR EXERCISE NO LESS THAN FOUR TO FIVE DAYS PER WEEK - BOTH WEIGHTBEARING EXERCISES AND AEROBIC ARE RECOMMENDED.  SHE IS ADVISED TO FOLLOW A HEALTHY DIET WITH AT LEAST SIX FRUITS/VEGGIES PER DAY, DECREASE INTAKE OF RED MEAT, AND TO INCREASE FISH INTAKE TO TWO DAYS PER WEEK.  MEATS/FISH SHOULD NOT BE FRIED --- BAKED OR BROILED IS PREFERABLE.  I SUGGEST WEARING SPF 50 SUNSCREEN ON EXPOSED PARTS AND ESPECIALLY WHEN IN THE DIRECT SUNLIGHT FOR AN EXTENDED PERIOD OF TIME.  PLEASE AVOID FAST FOOD RESTAURANTS AND INCREASE YOUR WATER INTAKE.  - CBC no Diff - CMP14+EGFR - Hemoglobin A1c - Lipid Profile  2. Hypertensive nephropathy  Fair control. She will continue with 1/2 lisinopril/hctz daily. She has been advised to take a full tablet of Metoprolol nightly. She will rto in six weeks for  re-evaluation.   - EKG 12-Lead - POCT Urinalysis Dipstick (81002) - POCT UA - Microalbumin  3. Chronic renal disease, stage II  Chronic. She is encouraged to stay well hydrated. She is also followed by Nephrology.   4. Needs flu shot   She was given flu vaccine to update her immunization history.   5. Mild intermittent asthma without complication  Chronic, yet stable. She was given sample/rx of Breo.   - fluticasone furoate-vilanterol (BREO ELLIPTA) 100-25 MCG/INH 1 puff   Maximino Greenland, MD

## 2018-03-04 NOTE — Progress Notes (Signed)
Here are your lab results:  Your blood count is normal.  Your kidney function has decreased slightly. Be sure to stay well hydrated. I also suggest you hydrate with coconut water after your workouts. Your liver function is nl. Your hba1c is 5.5, this is normal.  Your chol is great!   Take care,   RS

## 2018-04-11 ENCOUNTER — Ambulatory Visit: Payer: BC Managed Care – PPO | Admitting: Internal Medicine

## 2018-04-11 ENCOUNTER — Encounter: Payer: Self-pay | Admitting: Internal Medicine

## 2018-04-11 VITALS — BP 138/94 | HR 68 | Temp 97.8°F | Ht 65.0 in | Wt 133.2 lb

## 2018-04-11 DIAGNOSIS — N183 Chronic kidney disease, stage 3 unspecified: Secondary | ICD-10-CM | POA: Insufficient documentation

## 2018-04-11 DIAGNOSIS — G44229 Chronic tension-type headache, not intractable: Secondary | ICD-10-CM | POA: Diagnosis not present

## 2018-04-11 DIAGNOSIS — I129 Hypertensive chronic kidney disease with stage 1 through stage 4 chronic kidney disease, or unspecified chronic kidney disease: Secondary | ICD-10-CM | POA: Diagnosis not present

## 2018-04-11 NOTE — Patient Instructions (Signed)
Tension Headache A tension headache is pain, pressure, or aching that is felt over the front and sides of your head. These headaches can last from 30 minutes to several days. Follow these instructions at home: Managing pain  Take over-the-counter and prescription medicines only as told by your doctor.  Lie down in a dark, quiet room when you have a headache.  If directed, apply ice to your head and neck area: ? Put ice in a plastic bag. ? Place a towel between your skin and the bag. ? Leave the ice on for 20 minutes, 2-3 times per day.  Use a heating pad or a hot shower to apply heat to your head and neck area as told by your doctor. Eating and drinking  Eat meals on a regular schedule.  Do not drink a lot of alcohol.  Do not use a lot of caffeine, or stop using caffeine. General instructions  Keep all follow-up visits as told by your doctor. This is important.  Keep a journal to find out if certain things bring on headaches. For example, write down: ? What you eat and drink. ? How much sleep you get. ? Any change to your diet or medicines.  Try getting a massage, or doing other things that help you to relax.  Lessen stress.  Sit up straight. Do not tighten (tense) your muscles.  Do not use tobacco products. This includes cigarettes, chewing tobacco, or e-cigarettes. If you need help quitting, ask your doctor.  Exercise regularly as told by your doctor.  Get enough sleep. This may mean 7-9 hours of sleep. Contact a doctor if:  Your symptoms are not helped by medicine.  You have a headache that feels different from your usual headache.  You feel sick to your stomach (nauseous) or you throw up (vomit).  You have a fever. Get help right away if:  Your headache becomes very bad.  You keep throwing up.  You have a stiff neck.  You have trouble seeing.  You have trouble speaking.  You have pain in your eye or ear.  Your muscles are weak or you lose muscle  control.  You lose your balance or you have trouble walking.  You feel like you will pass out (faint) or you pass out.  You have confusion. This information is not intended to replace advice given to you by your health care provider. Make sure you discuss any questions you have with your health care provider. Document Released: 07/20/2009 Document Revised: 12/24/2015 Document Reviewed: 08/18/2014 Elsevier Interactive Patient Education  2018 Elsevier Inc.  

## 2018-04-26 ENCOUNTER — Ambulatory Visit: Payer: BC Managed Care – PPO | Admitting: Podiatry

## 2018-04-26 ENCOUNTER — Ambulatory Visit (INDEPENDENT_AMBULATORY_CARE_PROVIDER_SITE_OTHER): Payer: BC Managed Care – PPO

## 2018-04-26 ENCOUNTER — Encounter: Payer: Self-pay | Admitting: Podiatry

## 2018-04-26 VITALS — BP 119/70 | HR 66

## 2018-04-26 DIAGNOSIS — D3610 Benign neoplasm of peripheral nerves and autonomic nervous system, unspecified: Secondary | ICD-10-CM

## 2018-04-26 DIAGNOSIS — M205X9 Other deformities of toe(s) (acquired), unspecified foot: Secondary | ICD-10-CM | POA: Diagnosis not present

## 2018-04-26 DIAGNOSIS — M79672 Pain in left foot: Secondary | ICD-10-CM | POA: Diagnosis not present

## 2018-04-26 DIAGNOSIS — M2042 Other hammer toe(s) (acquired), left foot: Secondary | ICD-10-CM | POA: Diagnosis not present

## 2018-04-26 DIAGNOSIS — M2041 Other hammer toe(s) (acquired), right foot: Secondary | ICD-10-CM

## 2018-04-26 DIAGNOSIS — S93529A Sprain of metatarsophalangeal joint of unspecified toe(s), initial encounter: Secondary | ICD-10-CM | POA: Diagnosis not present

## 2018-04-26 DIAGNOSIS — M79671 Pain in right foot: Secondary | ICD-10-CM

## 2018-04-27 ENCOUNTER — Other Ambulatory Visit: Payer: Self-pay | Admitting: Podiatry

## 2018-04-27 DIAGNOSIS — M2041 Other hammer toe(s) (acquired), right foot: Secondary | ICD-10-CM

## 2018-04-27 DIAGNOSIS — M2042 Other hammer toe(s) (acquired), left foot: Principal | ICD-10-CM

## 2018-04-29 NOTE — Progress Notes (Signed)
Subjective:   Patient ID: Shelby Campbell, female   DOB: 42 y.o.   MRN: [id number]   HPI 42 year old female presents the office today for concerns of right foot pain.  She states that he gets pain on the outside aspect, lateral portion as well as to her right big toe.  The right fifth toe has been ongoing for last couple months and gradually getting worse.  The right big toe started yesterday when she jammed her toe.  She also has concerns of numbness to her left second toe which is been ongoing for some time as well.  She states that she notices this when she was going barefoot and this is intermittent.  She said no recent treatment for the issues.  She has no other concerns.   Review of Systems  All other systems reviewed and are negative.  Past Medical History:  Diagnosis Date  . Asthma   . Chronic kidney disease    sees Dr Erling Cruz- stage III per note   . Headache    Migraines  . Hiatal hernia   . Hypertension   . Mitral valve prolapse   . 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 Shortness of breath    related to asthma     Past Surgical History:  Procedure Laterality Date  . AXILLARY LYMPH NODE BIOPSY  2010   right   . CHOLECYSTECTOMY    . DILITATION & CURRETTAGE/HYSTROSCOPY WITH NOVASURE ABLATION N/A 05/04/2016   Procedure: DILATATION & CURETTAGE/HYSTEROSCOPY WITH NOVASURE ABLATION;  Surgeon: Servando Salina, MD;  Location: [facility];  Service: Gynecology;  Laterality: N/A;  . LAPAROSCOPIC CHOLECYSTECTOMY SINGLE PORT N/A 09/10/2013   Procedure: LAPAROSCOPIC CHOLECYSTECTOMY SINGLE SITE WITH CHOLANGIOGRAM;  Surgeon: Adin Hector, MD;  Location: [facility];  Service: General;  Laterality: N/A;  . TUBAL LIGATION       Current Outpatient Medications:  .  albuterol (PROVENTIL HFA;VENTOLIN HFA) 108 (90 BASE) MCG/ACT inhaler, Inhale 2 puffs into the lungs every 6 (six) hours as needed for wheezing or shortness of breath., Disp: , Rfl:  .  cholecalciferol (VITAMIN D) 1000 UNITS  tablet, Take 1,000 Units by mouth daily., Disp: , Rfl:  .  levocetirizine (XYZAL) 5 MG tablet, Take 5 mg by mouth every evening., Disp: , Rfl:  .  lisinopril-hydrochlorothiazide (PRINZIDE,ZESTORETIC) 20-12.5 MG per tablet, Take 1 tablet by mouth every evening. , Disp: , Rfl:  .  metoprolol succinate (TOPROL-XL) 25 MG 24 hr tablet, Take 25 mg by mouth daily., Disp: , Rfl:   Current Facility-Administered Medications:  .  fluticasone furoate-vilanterol (BREO ELLIPTA) 100-25 MCG/INH 1 puff, 1 puff, Inhalation, Daily, [name], MD  Allergies  Allergen Reactions  . Other Other (See Comments)    Sulfonamides, sulfonylureas, barbiturates, antifungals, ketamine, etomidate, rifapentine, rifampicin, rifabutine, nitrofurantoin, metronidazole, ergot derivatives, indinavir, nevirapine, ritonavir, saquinavir, progestogens, progesterones, carbamazepine, phenytoin, valproate, oxycodone, pentazocine, cocaine, gold antirheumatics agents, sodium aurothiomalate, methyldopa, fenfluramine, ethosuzimide, flupentixol, flutamide, disulfiram, lidocaine   . Sulfa Antibiotics Other (See Comments)    Heart beating fast          Objective:  Physical Exam  General: AAO x3, NAD  Dermatological: Hyperkeratotic tissue adjacent to the toenail on the right fifth toe.  This is where she is majority of tenderness.  Upon debridement there is no underlying ulceration, drainage or any clinical signs of infection noted today.  No other open lesions or pre-ulcerative lesions identified.  Vascular: Dorsalis Pedis artery and Posterior Tibial artery pedal  pulses are 2/4 bilateral with immedate capillary fill time. There is no pain with calf compression, swelling, warmth, erythema.   Neruologic: Grossly intact via light touch bilateral. Protective threshold with Semmes Wienstein monofilament intact to all pedal sites bilateral.  Negative Tinel sign.  Musculoskeletal: Adductovarus present of the right fifth toe resulting  hyperkeratotic lesion.  There is mild discomfort of the right first MPJ there is mild discomfort MPJ range of motion but there is no area pinpoint bony tenderness or pain to vibratory sensation.  No significant edema to this area or increase in warmth.  On the left foot subjectively she is getting some numbness of the second toe.  Mild contracture present at the toe itself.  No palpable neuroma is identified but there is some discomfort on the interspace of the second interspace.  Muscular strength 5/5 in all groups tested bilateral.  Gait: Unassisted, Nonantalgic.       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  There is no evidence of acute fracture or stress fracture identified today.  Adductovarus present. -I debrided the hyperkeratotic lesion without any complications or bleeding.  Dispensed offloading pads.  Discussed shoe modifications. -In regards to the right first MPJ discomfort.  This is an acute issue which started yesterday.  Unfortunately the kidney disease were hold off on anti-inflammatories.  We discussed wearing a stiffer soled shoe to take the pressure off the area.  This is not improving the next 1 to 2 weeks to let me know.  Ice to the area as well. -Concern for possible neuroma on the left second interspace.  Dispensed a neuroma pad applied to her insert.  Symptoms are intermittent.  Hold off on injection today as there is very minimal discomfort.  Trula Slade DPM

## 2018-05-20 ENCOUNTER — Encounter: Payer: Self-pay | Admitting: Internal Medicine

## 2018-05-20 NOTE — Progress Notes (Signed)
Subjective:     Patient ID: Shelby Campbell , female    DOB: [birth year] , 43 y.o.   MRN: [id number]   Chief Complaint  Patient presents with  . Hypertension    HPI  Hypertension  This is a chronic problem. The current episode started more than 1 year ago. The problem has been gradually improving since onset. The problem is controlled. Associated symptoms include headaches (she c/o headache, dull and throbbing. usually at the back of head, near neck. no associated visual disturbances. unable to identify triggers. ).   She reports compliance with meds. Admits to having salty foods during homecoming.   Past Medical History:  Diagnosis Date  . Asthma   . Chronic kidney disease    sees Dr Erling Cruz- stage III per note   . Headache    Migraines  . Hiatal hernia   . Hypertension   . Mitral valve prolapse   . 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)   . Shortness of breath    related to asthma      Family History  Problem Relation Age of Onset  . Hypertension Mother   . Arthritis Mother   . Hypertension Father   . Prostate cancer Father      Current Outpatient Medications:  .  albuterol (PROVENTIL HFA;VENTOLIN HFA) 108 (90 BASE) MCG/ACT inhaler, Inhale 2 puffs into the lungs every 6 (six) hours as needed for wheezing or shortness of breath., Disp: , Rfl:  .  cholecalciferol (VITAMIN D) 1000 UNITS tablet, Take 1,000 Units by mouth daily., Disp: , Rfl:  .  levocetirizine (XYZAL) 5 MG tablet, Take 5 mg by mouth every evening., Disp: , Rfl:  .  lisinopril-hydrochlorothiazide (PRINZIDE,ZESTORETIC) 20-12.5 MG per tablet, Take 1 tablet by mouth every evening. , Disp: , Rfl:  .  metoprolol succinate (TOPROL-XL) 25 MG 24 hr tablet, Take 25 mg by mouth daily., Disp: , Rfl:   Current Facility-Administered Medications:  .  fluticasone furoate-vilanterol (BREO ELLIPTA) 100-25 MCG/INH 1 puff, 1 puff, Inhalation, Daily, [name], MD   Allergies  Allergen Reactions  . Other Other (See  Comments)    Sulfonamides, sulfonylureas, barbiturates, antifungals, ketamine, etomidate, rifapentine, rifampicin, rifabutine, nitrofurantoin, metronidazole, ergot derivatives, indinavir, nevirapine, ritonavir, saquinavir, progestogens, progesterones, carbamazepine, phenytoin, valproate, oxycodone, pentazocine, cocaine, gold antirheumatics agents, sodium aurothiomalate, methyldopa, fenfluramine, ethosuzimide, flupentixol, flutamide, disulfiram, lidocaine   . Sulfa Antibiotics Other (See Comments)    Heart beating fast      Review of Systems  Constitutional: Negative.   Respiratory: Negative.   Cardiovascular: Negative.   Gastrointestinal: Negative.   Neurological: Positive for headaches (she c/o headache, dull and throbbing. usually at the back of head, near neck. no associated visual disturbances. unable to identify triggers. ).  Psychiatric/Behavioral: Negative.      Today's Vitals   04/11/18 0951  BP: (!) 138/94  Pulse: 68  Temp: 97.8 F (36.6 C)  TempSrc: Oral  Weight: 133 lb 3.2 oz (60.4 kg)  Height: 5\' 5"  (1.651 m)   Body mass index is 22.17 kg/m.   Objective:  Physical Exam Vitals signs and nursing note reviewed.  Constitutional:      Appearance: Normal appearance.  HENT:     Head: Normocephalic and atraumatic.  Cardiovascular:     Rate and Rhythm: Normal rate and regular rhythm.     Heart sounds: Normal heart sounds.  Pulmonary:     Effort: Pulmonary effort is normal.     Breath sounds: Normal breath sounds.  Musculoskeletal:     Comments: B/l Trap. Mm tenderness to deep palpation  Skin:    General: Skin is warm.  Neurological:     General: No focal deficit present.     Mental Status: She is alert.         Assessment And Plan:     1. Benign hypertension with chronic kidney disease  Not yet at goal. I will increase her to one whole tablet of lisinopril/hctz. She was previously decreased to a 1/2 tab daily due to episodic dizziness. She will rto in 4-6  weeks for re-evaluation. She will call sooner should she have any concerns.   2. Chronic renal disease, stage III (HCC)  Chronic. She is encouraged to stay well hydrated.   3. Chronic tension-type headache, not intractable  She is encouraged to use a muscle rub nightly on neck/shoulders. She will let me know if her symptoms persist. She is also advised to take magnesium nightly.   Shelby Greenland, MD

## 2018-05-23 ENCOUNTER — Ambulatory Visit: Payer: BC Managed Care – PPO | Admitting: Internal Medicine

## 2018-05-23 ENCOUNTER — Encounter: Payer: Self-pay | Admitting: Internal Medicine

## 2018-05-23 VITALS — BP 116/74 | HR 77 | Temp 98.1°F | Ht 65.0 in | Wt 131.8 lb

## 2018-05-23 DIAGNOSIS — I129 Hypertensive chronic kidney disease with stage 1 through stage 4 chronic kidney disease, or unspecified chronic kidney disease: Secondary | ICD-10-CM

## 2018-05-23 DIAGNOSIS — N183 Chronic kidney disease, stage 3 unspecified: Secondary | ICD-10-CM

## 2018-05-23 DIAGNOSIS — Z79899 Other long term (current) drug therapy: Secondary | ICD-10-CM | POA: Diagnosis not present

## 2018-05-23 LAB — BMP8+EGFR
BUN/Creatinine Ratio: 20 (ref 9–23)
BUN: 28 mg/dL — ABNORMAL HIGH (ref 6–24)
CO2: 22 mmol/L (ref 20–29)
Calcium: 9.5 mg/dL (ref 8.7–10.2)
Chloride: 102 mmol/L (ref 96–106)
Creatinine, Ser: 1.43 mg/dL — ABNORMAL HIGH (ref 0.57–1.00)
GFR calc Af Amer: 53 mL/min/{1.73_m2} — ABNORMAL LOW (ref >59)
GFR calc non Af Amer: 46 mL/min/{1.73_m2} — ABNORMAL LOW (ref >59)
Glucose: 83 mg/dL (ref 65–99)
Potassium: 4.5 mmol/L (ref 3.5–5.2)
Sodium: 138 mmol/L (ref 134–144)

## 2018-05-23 MED ORDER — METOPROLOL SUCCINATE ER 25 MG PO TB24: 25.0000 mg | ORAL_TABLET | Freq: Every day | ORAL | 2 refills | Status: DC

## 2018-05-23 MED ORDER — LISINOPRIL-HYDROCHLOROTHIAZIDE 20-12.5 MG PO TABS: 1.0000 | ORAL_TABLET | Freq: Every evening | ORAL | 2 refills | Status: DC

## 2018-05-23 NOTE — Patient Instructions (Signed)
Postnasal Drip  Postnasal drip is the feeling of mucus going down the back of your throat. Mucus is a slimy substance that moistens and cleans your nose and throat, as well as the air pockets in face bones near your forehead and cheeks (sinuses). Small amounts of mucus pass from your nose and sinuses down the back of your throat all the time. This is normal. When you produce too much mucus or the mucus gets too thick, you can feel it.  Some common causes of postnasal drip include:   Having more mucus because of:  ? A cold or the flu.  ? Allergies.  ? Cold air.  ? Certain medicines.   Having more mucus that is thicker because of:  ? A sinus or nasal infection.  ? Dry air.  ? A food allergy.  Follow these instructions at home:  Relieving discomfort     Gargle with a salt-water mixture 3-4 times a day or as needed. To make a salt-water mixture, completely dissolve -1 tsp of salt in 1 cup of warm water.   If the air in your home is dry, use a humidifier to add moisture to the air.   Use a saline spray or container (neti pot) to flush out the nose (nasal irrigation). These methods can help clear away mucus and keep the nasal passages moist.  General instructions   Take over-the-counter and prescription medicines only as told by your health care provider.   Follow instructions from your health care provider about eating or drinking restrictions. You may need to avoid caffeine.   Avoid things that you know you are allergic to (allergens), like dust, mold, pollen, pets, or certain foods.   Drink enough fluid to keep your urine pale yellow.   Keep all follow-up visits as told by your health care provider. This is important.  Contact a health care provider if:   You have a fever.   You have a sore throat.   You have difficulty swallowing.   You have headache.   You have sinus pain.   You have a cough that does not go away.   The mucus from your nose becomes thick and is green or yellow in color.   You have  cold or flu symptoms that last more than 10 days.  Summary   Postnasal drip is the feeling of mucus going down the back of your throat.   If your health care provider approves, use nasal irrigation or a nasal spray 2?4 times a day.   Avoid things that you know you are allergic to (allergens), like dust, mold, pollen, pets, or certain foods.  This information is not intended to replace advice given to you by your health care provider. Make sure you discuss any questions you have with your health care provider.  Document Released: 08/08/2016 Document Revised: 08/08/2016 Document Reviewed: 08/08/2016  Elsevier Interactive Patient Education  2019 [employer] Inc[employer]

## 2018-05-23 NOTE — Progress Notes (Signed)
Subjective:     Patient ID: Shelby Campbell , female    DOB: 1975/08/07 , 43 y.o.   MRN: [id number]   Chief Complaint  Patient presents with  . Hypertension    HPI  She is here today for a bp check. Her meds were increased to a full tablet of lisinopril/hctz at her last visit. She has tolerated the increase in medication without any issues. She has no new concerns at this time.   Hypertension      Past Medical History:  Diagnosis Date  . Asthma   . Chronic kidney disease    sees Dr Shelby Campbell- stage III per note   . Headache    Migraines  . Hiatal hernia   . Hypertension   . Mitral valve prolapse   . 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 Shortness of breath    related to asthma      Family History  Problem Relation Age of Onset  . Hypertension Mother   . Arthritis Mother   . Hypertension Father   . Prostate cancer Father      Current Outpatient Medications:  .  albuterol (PROVENTIL HFA;VENTOLIN HFA) 108 (90 BASE) MCG/ACT inhaler, Inhale 2 puffs into the lungs every 6 (six) hours as needed for wheezing or shortness of breath., Disp: , Rfl:  .  cholecalciferol (VITAMIN D) 1000 UNITS tablet, Take 1,000 Units by mouth daily., Disp: , Rfl:  .  levocetirizine (XYZAL) 5 MG tablet, Take 5 mg by mouth every evening., Disp: , Rfl:  .  lisinopril-hydrochlorothiazide (PRINZIDE,ZESTORETIC) 20-12.5 MG tablet, Take 1 tablet by mouth every evening., Disp: 90 tablet, Rfl: 2 .  magnesium oxide (MAG-OX) 400 MG tablet, Take 800 mg by mouth daily., Disp: , Rfl:  .  metoprolol succinate (TOPROL-XL) 25 MG 24 hr tablet, Take 1 tablet (25 mg total) by mouth daily., Disp: 90 tablet, Rfl: 2  Current Facility-Administered Medications:  .  fluticasone furoate-vilanterol (BREO ELLIPTA) 100-25 MCG/INH 1 puff, 1 puff, Inhalation, Daily, [name], MD   Allergies  Allergen Reactions  . Other Other (See Comments)    Sulfonamides, sulfonylureas, barbiturates, antifungals, ketamine,  etomidate, rifapentine, rifampicin, rifabutine, nitrofurantoin, metronidazole, ergot derivatives, indinavir, nevirapine, ritonavir, saquinavir, progestogens, progesterones, carbamazepine, phenytoin, valproate, oxycodone, pentazocine, cocaine, gold antirheumatics agents, sodium aurothiomalate, methyldopa, fenfluramine, ethosuzimide, flupentixol, flutamide, disulfiram, lidocaine   . Sulfa Antibiotics Other (See Comments)    Heart beating fast      Review of Systems  Constitutional: Negative.   Respiratory: Negative.   Cardiovascular: Negative.   Gastrointestinal: Negative.   Neurological: Negative.   Psychiatric/Behavioral: Negative.      Today's Vitals   05/23/18 1033  BP: 116/74  Pulse: 77  Temp: 98.1 F (36.7 C)  TempSrc: Oral  SpO2: 94%  Weight: 131 lb 12.8 oz (59.8 kg)   Body mass index is 21.93 kg/m.   Objective:  Physical Exam Vitals signs and nursing note reviewed.  Constitutional:      Appearance: Normal appearance.  HENT:     Head: Normocephalic and atraumatic.  Cardiovascular:     Rate and Rhythm: Normal rate and regular rhythm.     Heart sounds: Normal heart sounds.  Pulmonary:     Effort: Pulmonary effort is normal.     Breath sounds: Normal breath sounds.  Skin:    General: Skin is warm.  Neurological:     General: No focal deficit present.     Mental Status: She is alert.  Assessment And Plan:     1. Benign hypertension with chronic kidney disease  Well controlled. She will continue with current meds. She is encouraged to avoid adding salt to her foods. I will check a bmet today.   2. Chronic renal disease, stage III (HCC)  Chronic, yet stable. I will check GFr, Cr today.   3. Drug therapy  - BMP8+EGFR        Shelby Greenland, MD

## 2018-11-15 ENCOUNTER — Ambulatory Visit: Payer: BC Managed Care – PPO | Admitting: Internal Medicine

## 2018-11-15 ENCOUNTER — Other Ambulatory Visit: Payer: Self-pay

## 2018-11-15 ENCOUNTER — Encounter: Payer: Self-pay | Admitting: Internal Medicine

## 2018-11-15 VITALS — BP 128/90 | HR 77 | Temp 98.2°F | Ht 65.0 in | Wt 133.6 lb

## 2018-11-15 DIAGNOSIS — I129 Hypertensive chronic kidney disease with stage 1 through stage 4 chronic kidney disease, or unspecified chronic kidney disease: Secondary | ICD-10-CM

## 2018-11-15 DIAGNOSIS — M79641 Pain in right hand: Secondary | ICD-10-CM | POA: Diagnosis not present

## 2018-11-15 DIAGNOSIS — J4520 Mild intermittent asthma, uncomplicated: Secondary | ICD-10-CM | POA: Diagnosis not present

## 2018-11-15 DIAGNOSIS — Z8261 Family history of arthritis: Secondary | ICD-10-CM

## 2018-11-15 DIAGNOSIS — N183 Chronic kidney disease, stage 3 unspecified: Secondary | ICD-10-CM

## 2018-11-15 MED ORDER — ALBUTEROL SULFATE HFA 108 (90 BASE) MCG/ACT IN AERS: 2.0000 | INHALATION_SPRAY | Freq: Four times a day (QID) | RESPIRATORY_TRACT | 11 refills | Status: DC | PRN

## 2018-11-15 MED ORDER — ALBUTEROL SULFATE HFA 108 (90 BASE) MCG/ACT IN AERS: 2.0000 | INHALATION_SPRAY | Freq: Four times a day (QID) | RESPIRATORY_TRACT | 1 refills | Status: DC | PRN

## 2018-11-15 NOTE — Patient Instructions (Signed)
Asthma, Adult ° °Asthma is a long-term (chronic) condition in which the airways get tight and narrow. The airways are the breathing passages that lead from the nose and mouth down into the lungs. A person with asthma will have times when symptoms get worse. These are called asthma attacks. They can cause coughing, whistling sounds when you breathe (wheezing), shortness of breath, and chest pain. They can make it hard to breathe. There is no cure for asthma, but medicines and lifestyle changes can help control it. °There are many things that can bring on an asthma attack or make asthma symptoms worse (triggers). Common triggers include: °· Mold. °· Dust. °· Cigarette smoke. °· Cockroaches. °· Things that can cause allergy symptoms (allergens). These include animal skin flakes (dander) and pollen from trees or grass. °· Things that pollute the air. These may include household cleaners, wood smoke, smog, or chemical odors. °· Cold air, weather changes, and wind. °· Crying or laughing hard. °· Stress. °· Certain medicines or drugs. °· Certain foods such as dried fruit, potato chips, and grape juice. °· Infections, such as a cold or the flu. °· Certain medical conditions or diseases. °· Exercise or tiring activities. °Asthma may be treated with medicines and by staying away from the things that cause asthma attacks. Types of medicines may include: °· Controller medicines. These help prevent asthma symptoms. They are usually taken every day. °· Fast-acting reliever or rescue medicines. These quickly relieve asthma symptoms. They are used as needed and provide short-term relief. °· Allergy medicines if your attacks are brought on by allergens. °· Medicines to help control the body's defense (immune) system. °Follow these instructions at home: °Avoiding triggers in your home °· Change your heating and air conditioning filter often. °· Limit your use of fireplaces and wood stoves. °· Get rid of pests (such as roaches and  mice) and their droppings. °· Throw away plants if you see mold on them. °· Clean your floors. Dust regularly. Use cleaning products that do not smell. °· Have someone vacuum when you are not home. Use a vacuum cleaner with a HEPA filter if possible. °· Replace carpet with wood, tile, or vinyl flooring. Carpet can trap animal skin flakes and dust. °· Use allergy-proof pillows, mattress covers, and box spring covers. °· Wash bed sheets and blankets every week in hot water. Dry them in a dryer. °· Keep your bedroom free of any triggers. °· Avoid pets and keep windows closed when things that cause allergy symptoms are in the air. °· Use blankets that are made of polyester or cotton. °· Clean bathrooms and kitchens with bleach. If possible, have someone repaint the walls in these rooms with mold-resistant paint. Keep out of the rooms that are being cleaned and painted. °· Wash your hands often with soap and water. If soap and water are not available, use hand sanitizer. °· Do not allow anyone to smoke in your home. °General instructions °· Take over-the-counter and prescription medicines only as told by your doctor. °? Talk with your doctor if you have questions about how or when to take your medicines. °? Make note if you need to use your medicines more often than usual. °· Do not use any products that contain nicotine or tobacco, such as cigarettes and e-cigarettes. If you need help quitting, ask your doctor. °· Stay away from secondhand smoke. °· Avoid doing things outdoors when allergen counts are high and when air quality is low. °· Wear a ski mask   when doing outdoor activities in the winter. The mask should cover your nose and mouth. Exercise indoors on cold days if you can. °· Warm up before you exercise. Take time to cool down after exercise. °· Use a peak flow meter as told by your doctor. A peak flow meter is a tool that measures how well the lungs are working. °· Keep track of the peak flow meter's readings.  Write them down. °· Follow your asthma action plan. This is a written plan for taking care of your asthma and treating your attacks. °· Make sure you get all the shots (vaccines) that your doctor recommends. Ask your doctor about a flu shot and a pneumonia shot. °· Keep all follow-up visits as told by your doctor. This is important. °Contact a doctor if: °· You have wheezing, shortness of breath, or a cough even while taking medicine to prevent attacks. °· The mucus you cough up (sputum) is thicker than usual. °· The mucus you cough up changes from clear or white to yellow, green, gray, or bloody. °· You have problems from the medicine you are taking, such as: °? A rash. °? Itching. °? Swelling. °? Trouble breathing. °· You need reliever medicines more than 2-3 times a week. °· Your peak flow reading is still at 50-79% of your personal best after following the action plan for 1 hour. °· You have a fever. °Get help right away if: °· You seem to be worse and are not responding to medicine during an asthma attack. °· You are short of breath even at rest. °· You get short of breath when doing very little activity. °· You have trouble eating, drinking, or talking. °· You have chest pain or tightness. °· You have a fast heartbeat. °· Your lips or fingernails start to turn blue. °· You are light-headed or dizzy, or you faint. °· Your peak flow is less than 50% of your personal best. °· You feel too tired to breathe normally. °Summary °· Asthma is a long-term (chronic) condition in which the airways get tight and narrow. An asthma attack can make it hard to breathe. °· Asthma cannot be cured, but medicines and lifestyle changes can help control it. °· Make sure you understand how to avoid triggers and how and when to use your medicines. °This information is not intended to replace advice given to you by your health care provider. Make sure you discuss any questions you have with your health care provider. °Document  Released: 10/12/2007 Document Revised: 06/28/2018 Document Reviewed: 05/30/2016 °Elsevier Patient Education © 2020 Elsevier Inc. ° °

## 2018-11-15 NOTE — Progress Notes (Signed)
Subjective:     Patient ID: Shelby Campbell , female    DOB: 04-02-1976 , 43 y.o.   MRN: [id number]   Chief Complaint  Patient presents with  . Hypertension    HPI  Hypertension This is a chronic problem. The current episode started more than 1 year ago. The problem has been gradually improving since onset. The problem is controlled. Pertinent negatives include no blurred vision, chest pain, palpitations or shortness of breath. Past treatments include ACE inhibitors and diuretics. The current treatment provides moderate improvement. Hypertensive end-organ damage includes kidney disease.     Past Medical History:  Diagnosis Date  . Asthma   . Chronic kidney disease    sees Dr Erling Cruz- stage III per note   . Headache    Migraines  . Hiatal hernia   . Hypertension   . Mitral valve prolapse   . 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 Shortness of breath    related to asthma      Family History  Problem Relation Age of Onset  . Hypertension Mother   . Arthritis Mother   . Hypertension Father   . Prostate cancer Father      Current Outpatient Medications:  .  albuterol (VENTOLIN HFA) 108 (90 Base) MCG/ACT inhaler, Inhale 2 puffs into the lungs every 6 (six) hours as needed for wheezing or shortness of breath., Disp: 18 g, Rfl: 11 .  cholecalciferol (VITAMIN D) 1000 UNITS tablet, Take 1,000 Units by mouth daily., Disp: , Rfl:  .  lisinopril-hydrochlorothiazide (PRINZIDE,ZESTORETIC) 20-12.5 MG tablet, Take 1 tablet by mouth every evening., Disp: 90 tablet, Rfl: 2 .  magnesium oxide (MAG-OX) 400 MG tablet, Take 800 mg by mouth daily., Disp: , Rfl:  .  metoprolol succinate (TOPROL-XL) 25 MG 24 hr tablet, Take 1 tablet (25 mg total) by mouth daily., Disp: 90 tablet, Rfl: 2 .  levocetirizine (XYZAL) 5 MG tablet, Take 5 mg by mouth every evening., Disp: , Rfl:   Current Facility-Administered Medications:  .  fluticasone furoate-vilanterol (BREO ELLIPTA) 100-25 MCG/INH 1 puff,  1 puff, Inhalation, Daily, [name], MD   Allergies  Allergen Reactions  . Other Other (See Comments)    Sulfonamides, sulfonylureas, barbiturates, antifungals, ketamine, etomidate, rifapentine, rifampicin, rifabutine, nitrofurantoin, metronidazole, ergot derivatives, indinavir, nevirapine, ritonavir, saquinavir, progestogens, progesterones, carbamazepine, phenytoin, valproate, oxycodone, pentazocine, cocaine, gold antirheumatics agents, sodium aurothiomalate, methyldopa, fenfluramine, ethosuzimide, flupentixol, flutamide, disulfiram, lidocaine   . Sulfa Antibiotics Other (See Comments)    Heart beating fast      Review of Systems  Constitutional: Negative.   Eyes: Negative for blurred vision.  Respiratory: Negative.  Negative for shortness of breath.   Cardiovascular: Negative.  Negative for chest pain and palpitations.  Gastrointestinal: Negative.   Musculoskeletal: Positive for arthralgias.       She c/o r hand pain. She awakens with stiffness upon awakening. She reports both parents have rheumatoid arthritis.   Neurological: Negative.   Psychiatric/Behavioral: Negative.      Today's Vitals   11/15/18 0943  BP: 128/90  Pulse: 77  Temp: 98.2 F (36.8 C)  TempSrc: Oral  Weight: 133 lb 9.6 oz (60.6 kg)  Height: '5\' 5"'$  (1.651 m)   Body mass index is 22.23 kg/m.   Objective:  Physical Exam Vitals signs and nursing note reviewed.  Constitutional:      Appearance: Normal appearance.  HENT:     Head: Normocephalic and atraumatic.  Cardiovascular:     Rate  and Rhythm: Normal rate and regular rhythm.     Heart sounds: Normal heart sounds.  Pulmonary:     Effort: Pulmonary effort is normal.     Breath sounds: Normal breath sounds.  Musculoskeletal:     Right hand: She exhibits tenderness.     Comments: Positive squeeze test of r hand  Skin:    General: Skin is warm.  Neurological:     General: No focal deficit present.     Mental Status: She is alert.   Psychiatric:        Mood and Affect: Mood normal.        Behavior: Behavior normal.         Assessment And Plan:     1. Benign hypertension with chronic kidney disease  Fair control. She will continue with current meds. She is encouraged to avoid adding salt to her foods and to incorporate more exercise into her daily routine.   - BMP8+EGFR  2. Chronic renal disease, stage III (HCC)  Chronic. I will check GFR, Cr today. She is encouraged to stay well hydrated.   3. Mild intermittent asthma without complication  Chronic, yet stable. She was given sample of Dulera 128mg to use 2 puffs twice daily should she begin to use albuterol inhaler more than twice a week.   4. Pain of right hand  The squeeze test is postiive.  She reports family history of rheumatoid arthritis. I will check autoimmune arthritis panel.   - ANA, IFA (with reflex) - CYCLIC CITRUL PEPTIDE ANTIBODY, IGG/IGA - Rheumatoid factor - Sedimentation rate - Uric acid        RMaximino Greenland MD    THE PATIENT IS ENCOURAGED TO PRACTICE SOCIAL DISTANCING DUE TO THE COVID-19 PANDEMIC.

## 2018-11-19 LAB — BMP8+EGFR
BUN/Creatinine Ratio: 21 (ref 9–23)
BUN: 26 mg/dL — ABNORMAL HIGH (ref 6–24)
CO2: 24 mmol/L (ref 20–29)
Calcium: 9.7 mg/dL (ref 8.7–10.2)
Chloride: 101 mmol/L (ref 96–106)
Creatinine, Ser: 1.22 mg/dL — ABNORMAL HIGH (ref 0.57–1.00)
GFR calc Af Amer: 64 mL/min/{1.73_m2} (ref >59)
GFR calc non Af Amer: 55 mL/min/{1.73_m2} — ABNORMAL LOW (ref >59)
Glucose: 67 mg/dL (ref 65–99)
Potassium: 4.2 mmol/L (ref 3.5–5.2)
Sodium: 138 mmol/L (ref 134–144)

## 2018-11-19 LAB — FANA STAINING PATTERNS: Homogeneous Pattern: 1:80 {titer}

## 2018-11-19 LAB — CYCLIC CITRUL PEPTIDE ANTIBODY, IGG/IGA: Cyclic Citrullin Peptide Ab: 4 units (ref 0–19)

## 2018-11-19 LAB — ANTINUCLEAR ANTIBODIES, IFA: ANA Titer 1: POSITIVE — AB

## 2018-11-19 LAB — SEDIMENTATION RATE: Sed Rate: 8 mm/hr (ref 0–32)

## 2018-11-19 LAB — URIC ACID: Uric Acid: 6.7 mg/dL (ref 2.5–7.1)

## 2018-11-19 LAB — RHEUMATOID FACTOR: Rheumatoid fact SerPl-aCnc: <10 IU/mL (ref 0.0–13.9)

## 2018-11-21 ENCOUNTER — Encounter: Payer: Self-pay | Admitting: Internal Medicine

## 2018-11-21 ENCOUNTER — Ambulatory Visit: Payer: BC Managed Care – PPO | Admitting: Internal Medicine

## 2018-11-22 ENCOUNTER — Other Ambulatory Visit: Payer: Self-pay

## 2018-11-22 DIAGNOSIS — M79641 Pain in right hand: Secondary | ICD-10-CM

## 2018-12-25 NOTE — Progress Notes (Signed)
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  Subjective:  Pain in both hands.   History of Present Illness: Shelby Campbell is a 43 y.o. female seen in consultation per request of Dr. Baird Cancer.  According to patient she always had some discomfort in her hands and decreased grip strength.  She states the symptoms are started about a year ago with pain in her bilateral thumb and right middle finger.  She has constant dull ache and it is aggravated by doing certain activities.  She denies nocturnal pain.  She denies any history of swelling.  She denies any history of injury.  For her job she does mostly desk work.  For the last 1 year she has been boxing for exercise.  She states she did not box from March 2020 till May 2020 but her hand pain persist.  She has occasional discomfort in her knee joints but none of the other joints are painful.  There is positive family history of rheumatoid arthritis in her mother.  Activities of Daily Living:  Patient reports morning stiffness for 30 minutes.   Patient Denies nocturnal pain.  Difficulty dressing/grooming: Denies Difficulty climbing stairs: Denies Difficulty getting out of chair: Denies Difficulty using hands for taps, buttons, cutlery, and/or writing: Reports  Review of Systems  Constitutional: Negative for fatigue, night sweats, weight gain and weight loss.  HENT: Positive for mouth dryness. Negative for mouth sores, trouble swallowing, trouble swallowing and nose dryness.   Eyes: Negative for pain, redness, itching, visual disturbance and dryness.  Respiratory: Negative for cough, shortness of breath and difficulty breathing.   Cardiovascular: Negative for chest pain, palpitations, hypertension, irregular heartbeat and swelling in  legs/feet.  Gastrointestinal: Negative for blood in stool, constipation and diarrhea.  Endocrine: Negative for increased urination.  Genitourinary: Negative for difficulty urinating, painful urination and vaginal dryness.  Musculoskeletal: Positive for arthralgias, joint pain and morning stiffness. Negative for joint swelling, myalgias, muscle weakness, muscle tenderness and myalgias.  Skin: Positive for color change. Negative for rash, hair loss, redness, skin tightness, ulcers and sensitivity to sunlight.  Allergic/Immunologic: Negative for susceptible to infections.  Neurological: Positive for headaches. Negative for dizziness, memory loss, night sweats and weakness.  Hematological: Negative for bruising/bleeding tendency and swollen glands.  Psychiatric/Behavioral: Negative for depressed mood, confusion and sleep disturbance. The patient is not nervous/anxious.     PMFS History:  Patient Active Problem List   Diagnosis Date Noted  . Chronic renal disease, stage III ([place]) 04/11/2018  . Asthma 03/01/2018  . Benign hypertension with chronic kidney disease 02/26/2018  . Chronic gastritis, mild.  H Pylori negative 07/31/2013  . Chronic cholecystitis with calculus 07/31/2013    Past Medical History:  Diagnosis Date  . Asthma   . Chronic kidney disease    sees Dr Erling Cruz- stage III per note   . Headache    Migraines  . Hiatal hernia   . Hypertension   . Mitral valve prolapse   . 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 Shortness of breath    related to asthma     Family History  Problem Relation Age of Onset  .  Hypertension Mother   . Arthritis Mother   . Hypertension Father   . Prostate cancer Father   . Arthritis Father   . Gout Father   . Healthy Son   . Healthy Daughter    Past Surgical History:  Procedure Laterality Date  . AXILLARY LYMPH NODE BIOPSY  2010   right   . CHOLECYSTECTOMY    . DILITATION & CURRETTAGE/HYSTROSCOPY WITH NOVASURE ABLATION N/A  05/04/2016   Procedure: DILATATION & CURETTAGE/HYSTEROSCOPY WITH NOVASURE ABLATION;  Surgeon: Servando Salina, MD;  Location: [facility];  Service: Gynecology;  Laterality: N/A;  . LAPAROSCOPIC CHOLECYSTECTOMY SINGLE PORT N/A 09/10/2013   Procedure: LAPAROSCOPIC CHOLECYSTECTOMY SINGLE SITE WITH CHOLANGIOGRAM;  Surgeon: Adin Hector, MD;  Location: [facility];  Service: General;  Laterality: N/A;  . TUBAL LIGATION     Social History   Social History Narrative  . Not on file   Immunization History  Administered Date(s) Administered  . Influenza,inj,Quad PF,6+ Mos 03/01/2018  . Td 04/12/2003  . Tdap 12/20/2010     Objective: Vital Signs: BP (!) 136/91 (BP Location: Right Arm, Patient Position: Sitting, Cuff Size: Normal)   Pulse 70   Resp 12   Ht 5\' 5"  (1.651 m)   Wt 138 lb (62.6 kg)   BMI 22.96 kg/m    Physical Exam Vitals signs and nursing note reviewed.  Constitutional:      Appearance: She is well-developed.  HENT:     Head: Normocephalic and atraumatic.  Eyes:     Conjunctiva/sclera: Conjunctivae normal.  Neck:     Musculoskeletal: Normal range of motion.  Cardiovascular:     Rate and Rhythm: Normal rate and regular rhythm.     Heart sounds: Normal heart sounds.  Pulmonary:     Effort: Pulmonary effort is normal.     Breath sounds: Normal breath sounds.  Abdominal:     General: Bowel sounds are normal.     Palpations: Abdomen is soft.  Lymphadenopathy:     Cervical: No cervical adenopathy.  Skin:    General: Skin is warm and dry.     Capillary Refill: Capillary refill takes less than 2 seconds.  Neurological:     Mental Status: She is alert and oriented to person, place, and time.  Psychiatric:        Behavior: Behavior normal.      Musculoskeletal Exam: C-spine thoracic and lumbar spine with good range of motion.  Shoulder joints elbow joints wrist joints with good range of motion.  She has tenderness on palpation of bilateral wrist joints right first, second  and third MCPs.  No synovitis was noted.  She had tenderness over left wrist and left first MCP joint.  No tenderness was noted.  Hip joints, knee joints, ankles were in good range of motion.  She had tenderness across her MTPs but no synovitis was noted.  CDAI Exam: CDAI Score: - Patient Global: -; Provider Global: - Swollen: -; Tender: - Joint Exam   No joint exam has been documented for this visit   There is currently no information documented on the homunculus. Go to the Rheumatology activity and complete the homunculus joint exam.  Investigation: Findings:  11/15/18: ANA 1:80 Homogeneous, RF <10, CCP 4, sed rate 8, uric acid 6.7  Component     Latest Ref Rng & Units 11/15/2018  ANA Titer 1      Positive (A)  Cyclic Citrullin Peptide Ab     0 - 19 units 4  RA  Latex Turbid.     0.0 - 13.9 IU/mL <10.0  Sed Rate     0 - 32 mm/hr 8  Uric Acid     2.5 - 7.1 mg/dL 6.7   Imaging: Xr Foot 2 Views Left  Result Date: 01/07/2019 No MTP narrowing was noted.  Mild PIP and DIP narrowing was noted.  No intertarsal, tibiotalar subtalar joint space narrowing was noted.  No erosive changes were noted. Impression: These findings were consistent with mild osteoarthritis of the foot.  Xr Foot 2 Views Right  Result Date: 01/07/2019 No MTP narrowing was noted.  Mild PIP and DIP narrowing was noted.  No intertarsal, tibiotalar subtalar joint space narrowing was noted.  No erosive changes were noted. Impression: These findings were consistent with mild osteoarthritis of the foot.  Xr Hand 2 View Left  Result Date: 01/07/2019 No MCP, PIP or DIP narrowing was noted.  No intercarpal, radiocarpal or metacarpocarpal joint space narrowing was noted.  No erosive changes were noted. Impression: Unremarkable x-ray of the hand.  Xr Hand 2 View Right  Result Date: 01/07/2019 No MCP, PIP or DIP narrowing was noted.  No intercarpal, radiocarpal or metacarpocarpal joint space narrowing was noted.  No erosive  changes were noted. Impression: Unremarkable x-ray of the hand.   Recent Labs: Lab Results  Component Value Date   WBC 4.6 03/01/2018   HGB 12.5 03/01/2018   PLT 251 03/01/2018   NA 138 11/15/2018   K 4.2 11/15/2018   CL 101 11/15/2018   CO2 24 11/15/2018   GLUCOSE 67 11/15/2018   BUN 26 (H) 11/15/2018   CREATININE 1.22 (H) 11/15/2018   BILITOT 0.6 03/01/2018   ALKPHOS 58 03/01/2018   AST 25 03/01/2018   ALT 19 03/01/2018   PROT 7.2 03/01/2018   ALBUMIN 4.3 03/01/2018   CALCIUM 9.7 11/15/2018   GFRAA 64 11/15/2018    Speciality Comments: No specialty comments available.  Procedures:  No procedures performed Allergies: Other and Sulfa antibiotics   Assessment / Plan:     Visit Diagnoses: Pain in both hands - 11/15/18: ANA 1:80 Homogeneous, RF <10, CCP 4, sed rate 8, uric acid 6.7 -patient has been experiencing pain in her hands and stiffness for several years which is been worse in the last 1 year.  She complains of discomfort in her both wrist and some of her MCP joints.  No synovitis was noted.  X-rays obtained today were unremarkable.  I will obtain additional labs today.  Plan: XR Hand 2 View Right, XR Hand 2 View Left, Sedimentation rate, 14-3-3 eta Protein  Pain in both feet -she complains of discomfort in her bilateral feet.  She had tenderness on palpation across her MTPs.  No synovitis was noted.  X-ray showed mild osteoarthritic changes.  Plan: XR Foot 2 Views Right, XR Foot 2 Views Left  Family history of rheumatoid arthritis - Mother-she is on Humira and methotrexate.  Positive ANA (antinuclear antibody) -patient gives history of sicca symptoms.  She has no other clinical features of lupus.  Plan: CBC with Differential/Platelet, ANA, Anti-scleroderma antibody, RNP Antibody, Anti-[place] antibody, Sjogrens syndrome-A extractable nuclear antibody, Sjogrens syndrome-B extractable nuclear antibody, Anti-DNA antibody, double-stranded, C3 and C4, Glucose 6 phosphate  dehydrogenase  Benign hypertension with chronic kidney disease-her blood pressure is a still elevated.  Mild intermittent asthma without complication-currently not very active.  Chronic cholecystitis with calculus  Chronic renal disease, stage III (HCC)-patient was followed by Dr. Florene Glen in the past.  She is  not established with another nephrologist since then.  History of gastritis  Orders: Orders Placed This Encounter  Procedures  . XR Hand 2 View Right  . XR Hand 2 View Left  . XR Foot 2 Views Right  . XR Foot 2 Views Left  . CBC with Differential/Platelet  . ANA  . Anti-scleroderma antibody  . RNP Antibody  . Anti-Markgraf antibody  . Sjogrens syndrome-A extractable nuclear antibody  . Sjogrens syndrome-B extractable nuclear antibody  . Anti-DNA antibody, double-stranded  . C3 and C4  . 14-3-3 eta Protein  . Glucose 6 phosphate dehydrogenase   No orders of the defined types were placed in this encounter.    Follow-Up Instructions: Return for Pain in both hands.Bo Merino, MD  Note - This record has been created using Editor, commissioning.  Chart creation errors have been sought, but may not always  have been located. Such creation errors do not reflect on  the standard of medical care.

## 2019-01-07 ENCOUNTER — Ambulatory Visit: Payer: Self-pay

## 2019-01-07 ENCOUNTER — Ambulatory Visit (INDEPENDENT_AMBULATORY_CARE_PROVIDER_SITE_OTHER): Payer: BC Managed Care – PPO | Admitting: Rheumatology

## 2019-01-07 ENCOUNTER — Other Ambulatory Visit: Payer: Self-pay

## 2019-01-07 ENCOUNTER — Encounter: Payer: Self-pay | Admitting: Rheumatology

## 2019-01-07 VITALS — BP 136/91 | HR 70 | Resp 12 | Ht 65.0 in | Wt 138.0 lb

## 2019-01-07 DIAGNOSIS — Z8261 Family history of arthritis: Secondary | ICD-10-CM | POA: Diagnosis not present

## 2019-01-07 DIAGNOSIS — N183 Chronic kidney disease, stage 3 unspecified: Secondary | ICD-10-CM

## 2019-01-07 DIAGNOSIS — M79642 Pain in left hand: Secondary | ICD-10-CM

## 2019-01-07 DIAGNOSIS — M79671 Pain in right foot: Secondary | ICD-10-CM | POA: Diagnosis not present

## 2019-01-07 DIAGNOSIS — M79641 Pain in right hand: Secondary | ICD-10-CM | POA: Diagnosis not present

## 2019-01-07 DIAGNOSIS — Z8719 Personal history of other diseases of the digestive system: Secondary | ICD-10-CM

## 2019-01-07 DIAGNOSIS — M79672 Pain in left foot: Secondary | ICD-10-CM | POA: Diagnosis not present

## 2019-01-07 DIAGNOSIS — I129 Hypertensive chronic kidney disease with stage 1 through stage 4 chronic kidney disease, or unspecified chronic kidney disease: Secondary | ICD-10-CM

## 2019-01-07 DIAGNOSIS — R768 Other specified abnormal immunological findings in serum: Secondary | ICD-10-CM

## 2019-01-07 DIAGNOSIS — K8010 Calculus of gallbladder with chronic cholecystitis without obstruction: Secondary | ICD-10-CM

## 2019-01-07 DIAGNOSIS — J4520 Mild intermittent asthma, uncomplicated: Secondary | ICD-10-CM

## 2019-01-11 LAB — CBC WITH DIFFERENTIAL/PLATELET
Absolute Monocytes: 289 cells/uL (ref 200–950)
Basophils Absolute: 11 cells/uL (ref 0–200)
Basophils Relative: 0.4 %
Eosinophils Absolute: 19 cells/uL (ref 15–500)
Eosinophils Relative: 0.7 %
HCT: 38.4 % (ref 35.0–45.0)
Hemoglobin: 12.5 g/dL (ref 11.7–15.5)
Lymphs Abs: 659 cells/uL — ABNORMAL LOW (ref 850–3900)
MCH: 30.3 pg (ref 27.0–33.0)
MCHC: 32.6 g/dL (ref 32.0–36.0)
MCV: 93.2 fL (ref 80.0–100.0)
MPV: 10.9 fL (ref 7.5–12.5)
Monocytes Relative: 10.7 %
Neutro Abs: 1723 cells/uL (ref 1500–7800)
Neutrophils Relative %: 63.8 %
Platelets: 202 10*3/uL (ref 140–400)
RBC: 4.12 10*6/uL (ref 3.80–5.10)
RDW: 13 % (ref 11.0–15.0)
Total Lymphocyte: 24.4 %
WBC: 2.7 10*3/uL — ABNORMAL LOW (ref 3.8–10.8)

## 2019-01-11 LAB — ANTI-SCLERODERMA ANTIBODY: Scleroderma (Scl-70) (ENA) Antibody, IgG: <1 AI

## 2019-01-11 LAB — ANTI-SMITH ANTIBODY: ENA SM Ab Ser-aCnc: <1 AI

## 2019-01-11 LAB — ANTI-NUCLEAR AB-TITER (ANA TITER): ANA Titer 1: 1:80 {titer} — ABNORMAL HIGH

## 2019-01-11 LAB — ANA: Anti Nuclear Antibody (ANA): POSITIVE — AB

## 2019-01-11 LAB — RNP ANTIBODY: Ribonucleic Protein(ENA) Antibody, IgG: <1 AI

## 2019-01-11 LAB — C3 AND C4
C3 Complement: 114 mg/dL (ref 83–193)
C4 Complement: 35 mg/dL (ref 15–57)

## 2019-01-11 LAB — GLUCOSE 6 PHOSPHATE DEHYDROGENASE: G-6PDH: 13.8 U/g Hgb (ref 7.0–20.5)

## 2019-01-11 LAB — SJOGRENS SYNDROME-A EXTRACTABLE NUCLEAR ANTIBODY: SSA (Ro) (ENA) Antibody, IgG: <1 AI

## 2019-01-11 LAB — 14-3-3 ETA PROTEIN: 14-3-3 eta Protein: <0.2 ng/mL (ref <0.2)

## 2019-01-11 LAB — ANTI-DNA ANTIBODY, DOUBLE-STRANDED: ds DNA Ab: 1 IU/mL

## 2019-01-11 LAB — SJOGRENS SYNDROME-B EXTRACTABLE NUCLEAR ANTIBODY: SSB (La) (ENA) Antibody, IgG: <1 AI

## 2019-01-11 NOTE — Progress Notes (Signed)
I will discuss labs at the follow-up visits.

## 2019-01-15 NOTE — Progress Notes (Signed)
Office Visit Note  Patient: Shelby Campbell             Date of Birth: [birth year]           MRN: [id number]             PCP: [name], MD Referring: [name], MD Visit Date: 01/29/2019 Occupation: @GUAROCC @  Subjective:  Stiffness in joints.   History of Present Illness: Shelby Campbell is a 43 y.o. female with history of osteoarthritis and positive ANA.  She states she continues to have some stiffness in her hands her knees and her feet.  She denies any joint swelling.  She states she also has dry nose.  She has been using some nasal spray.  She denies any joint swelling.  There is no history of oral ulcers, nasal ulcers, malar rash or Raynaud's phenomenon.  Activities of Daily Living:  Patient reports morning stiffness for a few  hours.   Patient Denies nocturnal pain.  Difficulty dressing/grooming: Denies Difficulty climbing stairs: Denies Difficulty getting out of chair: Denies Difficulty using hands for taps, buttons, cutlery, and/or writing: Reports  Review of Systems  Constitutional: Negative for fatigue, night sweats, weight gain and weight loss.  HENT: Positive for nose dryness. Negative for mouth sores, trouble swallowing, trouble swallowing and mouth dryness.   Eyes: Negative for pain, redness, itching, visual disturbance and dryness.  Respiratory: Negative for cough, shortness of breath and difficulty breathing.   Cardiovascular: Negative for chest pain, palpitations, hypertension, irregular heartbeat and swelling in legs/feet.  Gastrointestinal: Negative for blood in stool, constipation and diarrhea.  Endocrine: Negative for increased urination.  Genitourinary: Negative for difficulty urinating, painful urination and vaginal dryness.  Musculoskeletal: Positive for arthralgias, joint pain and morning stiffness. Negative for joint swelling, myalgias, muscle weakness, muscle tenderness and myalgias.  Skin: Negative for color change, rash, hair loss, skin  tightness, ulcers and sensitivity to sunlight.  Allergic/Immunologic: Negative for susceptible to infections.  Neurological: Negative for dizziness, numbness, headaches, memory loss, night sweats and weakness.  Hematological: Negative for swollen glands.  Psychiatric/Behavioral: Negative for depressed mood, confusion and sleep disturbance. The patient is not nervous/anxious.     PMFS History:  Patient Active Problem List   Diagnosis Date Noted  . Chronic renal disease, stage III ([place]) 04/11/2018  . Asthma 03/01/2018  . Benign hypertension with chronic kidney disease 02/26/2018  . Chronic gastritis, mild.  H Pylori negative 07/31/2013  . Chronic cholecystitis with calculus 07/31/2013    Past Medical History:  Diagnosis Date  . Asthma   . Chronic kidney disease    sees Dr Erling Cruz- stage III per note   . Headache    Migraines  . Hiatal hernia   . Hypertension   . Mitral valve prolapse   . 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 Shortness of breath    related to asthma     Family History  Problem Relation Age of Onset  . Hypertension Mother   . Arthritis Mother   . Hypertension Father   . Prostate cancer Father   . Arthritis Father   . Gout Father   . Healthy Son   . Healthy Daughter    Past Surgical History:  Procedure Laterality Date  . AXILLARY LYMPH NODE BIOPSY  2010   right   . CHOLECYSTECTOMY    . DILITATION & CURRETTAGE/HYSTROSCOPY WITH NOVASURE ABLATION N/A 05/04/2016   Procedure: DILATATION & CURETTAGE/HYSTEROSCOPY WITH NOVASURE ABLATION;  Surgeon: Alanda Slim  Garwin Brothers, MD;  Location: [facility];  Service: Gynecology;  Laterality: N/A;  . LAPAROSCOPIC CHOLECYSTECTOMY SINGLE PORT N/A 09/10/2013   Procedure: LAPAROSCOPIC CHOLECYSTECTOMY SINGLE SITE WITH CHOLANGIOGRAM;  Surgeon: Adin Hector, MD;  Location: [facility];  Service: General;  Laterality: N/A;  . TUBAL LIGATION     Social History   Social History Narrative  . Not on file   Immunization History   Administered Date(s) Administered  . Influenza,inj,Quad PF,6+ Mos 03/01/2018  . Td 04/12/2003  . Tdap 12/20/2010     Objective: Vital Signs: BP (!) 144/92 (BP Location: Left Arm, Patient Position: Sitting, Cuff Size: Normal)   Pulse 89   Resp 12   Ht 5\' 5"  (1.651 m)   Wt 137 lb (62.1 kg)   BMI 22.80 kg/m    Physical Exam Vitals signs and nursing note reviewed.  Constitutional:      Appearance: She is well-developed.  HENT:     Head: Normocephalic and atraumatic.  Eyes:     Conjunctiva/sclera: Conjunctivae normal.  Neck:     Musculoskeletal: Normal range of motion.  Cardiovascular:     Rate and Rhythm: Normal rate and regular rhythm.     Heart sounds: Normal heart sounds.  Pulmonary:     Effort: Pulmonary effort is normal.     Breath sounds: Normal breath sounds.  Abdominal:     General: Bowel sounds are normal.     Palpations: Abdomen is soft.  Lymphadenopathy:     Cervical: No cervical adenopathy.  Skin:    General: Skin is warm and dry.     Capillary Refill: Capillary refill takes less than 2 seconds.  Neurological:     Mental Status: She is alert and oriented to person, place, and time.  Psychiatric:        Behavior: Behavior normal.      Musculoskeletal Exam: C-spine, thoracic and lumbar spine with good range of motion.  Shoulder joints, elbow joints, wrist joints, MCPs PIPs and DIPs with good range of motion with no synovitis.  She has some prominence of her PIP joints in her hands.  Hip joints with good range of motion.  She has mild discomfort range of motion of her knee joints.  She is some osteoarthritic changes in her feet.  CDAI Exam: CDAI Score: - Patient Global: -; Provider Global: - Swollen: -; Tender: - Joint Exam   No joint exam has been documented for this visit   There is currently no information documented on the homunculus. Go to the Rheumatology activity and complete the homunculus joint exam.  Investigation: No additional findings.   Imaging: Xr Foot 2 Views Left  Result Date: 01/07/2019 No MTP narrowing was noted.  Mild PIP and DIP narrowing was noted.  No intertarsal, tibiotalar subtalar joint space narrowing was noted.  No erosive changes were noted. Impression: These findings were consistent with mild osteoarthritis of the foot.  Xr Foot 2 Views Right  Result Date: 01/07/2019 No MTP narrowing was noted.  Mild PIP and DIP narrowing was noted.  No intertarsal, tibiotalar subtalar joint space narrowing was noted.  No erosive changes were noted. Impression: These findings were consistent with mild osteoarthritis of the foot.  Xr Hand 2 View Left  Result Date: 01/07/2019 No MCP, PIP or DIP narrowing was noted.  No intercarpal, radiocarpal or metacarpocarpal joint space narrowing was noted.  No erosive changes were noted. Impression: Unremarkable x-ray of the hand.  Xr Hand 2 View Right  Result Date: 01/07/2019 No  MCP, PIP or DIP narrowing was noted.  No intercarpal, radiocarpal or metacarpocarpal joint space narrowing was noted.  No erosive changes were noted. Impression: Unremarkable x-ray of the hand.   Recent Labs: Lab Results  Component Value Date   WBC 2.7 (L) 01/07/2019   HGB 12.5 01/07/2019   PLT 202 01/07/2019   NA 138 11/15/2018   K 4.2 11/15/2018   CL 101 11/15/2018   CO2 24 11/15/2018   GLUCOSE 67 11/15/2018   BUN 26 (H) 11/15/2018   CREATININE 1.22 (H) 11/15/2018   BILITOT 0.6 03/01/2018   ALKPHOS 58 03/01/2018   AST 25 03/01/2018   ALT 19 03/01/2018   PROT 7.2 03/01/2018   ALBUMIN 4.3 03/01/2018   CALCIUM 9.7 11/15/2018   GFRAA 64 11/15/2018  January 07, 2019 ANA 1: 80 speckled, ENA negative, dsDNA negative, G6PD normal, 14 3 3  at negative  11/15/18: ANA 1:80 Homogeneous, RF <10, CCP 4, sed rate 8, uric acid 6.7  Speciality Comments: No specialty comments available.  Procedures:  No procedures performed Allergies: Other and Sulfa antibiotics   Assessment / Plan:     Visit Diagnoses:  Pain in both hands - All autoimmune work-up was negative.  X-rays were unremarkable.  Patient gives history of pain and stiffness in her hands.  She has mild prominence of PIP joints.  No synovitis was noted.  A handout on hand exercises was given.  Natural anti-inflammatories were discussed.  Primary osteoarthritis of both feet - mild.  Proper fitting shoes were discussed.  Positive ANA (antinuclear antibody) - ANA 1: 80 speckled, ENA negative, C3-C4 normal patient had no clinical features of autoimmune disease.  History of sicca symptoms.  Sicca syndrome (HCC) - ANA 1: 80 speckled, ENA negative.  She only has dry nose.  Have advised her to use saline nasal spray..  We may repeat her ANA in 6 months.  Family history of rheumatoid arthritis-she has no synovitis on examination today.  Benign hypertension with chronic kidney disease-her blood pressure is a still elevated despite being on her antihypertensives.  Chronic renal disease, stage III (HCC)-her GFR is in the 50s and 60s based on the last labs in July.  History of gastritis  Mild intermittent asthma without complication  Chronic cholecystitis with calculus  Orders: No orders of the defined types were placed in this encounter.  No orders of the defined types were placed in this encounter.     Follow-Up Instructions: Return in about 6 months (around 07/29/2019) for Osteoarthritis, sicca, +ANA.   Bo Merino, MD  Note - This record has been created using Editor, commissioning.  Chart creation errors have been sought, but may not always  have been located. Such creation errors do not reflect on  the standard of medical care.

## 2019-01-29 ENCOUNTER — Ambulatory Visit (INDEPENDENT_AMBULATORY_CARE_PROVIDER_SITE_OTHER): Payer: BC Managed Care – PPO | Admitting: Rheumatology

## 2019-01-29 ENCOUNTER — Encounter: Payer: Self-pay | Admitting: Rheumatology

## 2019-01-29 ENCOUNTER — Other Ambulatory Visit: Payer: Self-pay

## 2019-01-29 VITALS — BP 144/92 | HR 89 | Resp 12 | Ht 65.0 in | Wt 137.0 lb

## 2019-01-29 DIAGNOSIS — R7689 Other specified abnormal immunological findings in serum: Secondary | ICD-10-CM

## 2019-01-29 DIAGNOSIS — I129 Hypertensive chronic kidney disease with stage 1 through stage 4 chronic kidney disease, or unspecified chronic kidney disease: Secondary | ICD-10-CM

## 2019-01-29 DIAGNOSIS — M3500 Sicca syndrome, unspecified: Secondary | ICD-10-CM

## 2019-01-29 DIAGNOSIS — R768 Other specified abnormal immunological findings in serum: Secondary | ICD-10-CM

## 2019-01-29 DIAGNOSIS — Z8261 Family history of arthritis: Secondary | ICD-10-CM

## 2019-01-29 DIAGNOSIS — K8010 Calculus of gallbladder with chronic cholecystitis without obstruction: Secondary | ICD-10-CM

## 2019-01-29 DIAGNOSIS — M19071 Primary osteoarthritis, right ankle and foot: Secondary | ICD-10-CM | POA: Diagnosis not present

## 2019-01-29 DIAGNOSIS — Z8719 Personal history of other diseases of the digestive system: Secondary | ICD-10-CM

## 2019-01-29 DIAGNOSIS — M79641 Pain in right hand: Secondary | ICD-10-CM

## 2019-01-29 DIAGNOSIS — J4520 Mild intermittent asthma, uncomplicated: Secondary | ICD-10-CM

## 2019-01-29 DIAGNOSIS — N183 Chronic kidney disease, stage 3 unspecified: Secondary | ICD-10-CM

## 2019-01-29 DIAGNOSIS — M19072 Primary osteoarthritis, left ankle and foot: Secondary | ICD-10-CM

## 2019-01-29 DIAGNOSIS — M79642 Pain in left hand: Secondary | ICD-10-CM

## 2019-01-29 NOTE — Patient Instructions (Addendum)
Hand Exercises °Hand exercises can be helpful for almost anyone. These exercises can strengthen the hands, improve flexibility and movement, and increase blood flow to the hands. These results can make work and daily tasks easier. Hand exercises can be especially helpful for people who have joint pain from arthritis or have nerve damage from overuse (carpal tunnel syndrome). These exercises can also help people who have injured a hand. °Exercises °Most of these hand exercises are gentle stretching and motion exercises. It is usually safe to do them often throughout the day. Warming up your hands before exercise may help to reduce stiffness. You can do this with gentle massage or by placing your hands in warm water for 10-15 minutes. °It is normal to feel some stretching, pulling, tightness, or mild discomfort as you begin new exercises. This will gradually improve. Stop an exercise right away if you feel sudden, severe pain or your pain gets worse. Ask your health care provider which exercises are best for you. °Knuckle bend or "claw" fist °1. Stand or sit with your arm, hand, and all five fingers pointed straight up. Make sure to keep your wrist straight during the exercise. °2. Gently bend your fingers down toward your palm until the tips of your fingers are touching the top of your palm. Keep your big knuckle straight and just bend the small knuckles in your fingers. °3. Hold this position for __________ seconds. °4. Straighten (extend) your fingers back to the starting position. °Repeat this exercise 5-10 times with each hand. °Full finger fist °1. Stand or sit with your arm, hand, and all five fingers pointed straight up. Make sure to keep your wrist straight during the exercise. °2. Gently bend your fingers into your palm until the tips of your fingers are touching the middle of your palm. °3. Hold this position for __________ seconds. °4. Extend your fingers back to the starting position, stretching every  joint fully. °Repeat this exercise 5-10 times with each hand. °Straight fist °1. Stand or sit with your arm, hand, and all five fingers pointed straight up. Make sure to keep your wrist straight during the exercise. °2. Gently bend your fingers at the big knuckle, where your fingers meet your hand, and the middle knuckle. Keep the knuckle at the tips of your fingers straight and try to touch the bottom of your palm. °3. Hold this position for __________ seconds. °4. Extend your fingers back to the starting position, stretching every joint fully. °Repeat this exercise 5-10 times with each hand. °Tabletop °1. Stand or sit with your arm, hand, and all five fingers pointed straight up. Make sure to keep your wrist straight during the exercise. °2. Gently bend your fingers at the big knuckle, where your fingers meet your hand, as far down as you can while keeping the small knuckles in your fingers straight. Think of forming a tabletop with your fingers. °3. Hold this position for __________ seconds. °4. Extend your fingers back to the starting position, stretching every joint fully. °Repeat this exercise 5-10 times with each hand. °Finger spread °1. Place your hand flat on a table with your palm facing down. Make sure your wrist stays straight as you do this exercise. °2. Spread your fingers and thumb apart from each other as far as you can until you feel a gentle stretch. Hold this position for __________ seconds. °3. Bring your fingers and thumb tight together again. Hold this position for __________ seconds. °Repeat this exercise 5-10 times with each hand. °  Making circles 1. Stand or sit with your arm, hand, and all five fingers pointed straight up. Make sure to keep your wrist straight during the exercise. 2. Make a circle by touching the tip of your thumb to the tip of your index finger. 3. Hold for __________ seconds. Then open your hand wide. 4. Repeat this motion with your thumb and each finger on your  hand. Repeat this exercise 5-10 times with each hand. Thumb motion 1. Sit with your forearm resting on a table and your wrist straight. Your thumb should be facing up toward the ceiling. Keep your fingers relaxed as you move your thumb. 2. Lift your thumb up as high as you can toward the ceiling. Hold for __________ seconds. 3. Bend your thumb across your palm as far as you can, reaching the tip of your thumb for the small finger (pinkie) side of your palm. Hold for __________ seconds. Repeat this exercise 5-10 times with each hand. Grip strengthening  1. Hold a stress ball or other soft ball in the middle of your hand. 2. Slowly increase the pressure, squeezing the ball as much as you can without causing pain. Think of bringing the tips of your fingers into the middle of your palm. All of your finger joints should bend when doing this exercise. 3. Hold your squeeze for __________ seconds, then relax. Repeat this exercise 5-10 times with each hand. Contact a health care provider if:  Your hand pain or discomfort gets much worse when you do an exercise.  Your hand pain or discomfort does not improve within 2 hours after you exercise. If you have any of these problems, stop doing these exercises right away. Do not do them again unless your health care provider says that you can. Get help right away if:  You develop sudden, severe hand pain or swelling. If this happens, stop doing these exercises right away. Do not do them again unless your health care provider says that you can. This information is not intended to replace advice given to you by your health care provider. Make sure you discuss any questions you have with your health care provider. Document Released: 04/06/2015 Document Revised: 08/16/2018 Document Reviewed: 04/26/2018 Elsevier Patient Education  [place].    Diclofenac gel is available over-the-counter.  You may use 4 times a day as needed.

## 2019-03-05 ENCOUNTER — Encounter: Payer: Self-pay | Admitting: Internal Medicine

## 2019-03-06 ENCOUNTER — Encounter: Payer: BC Managed Care – PPO | Admitting: Internal Medicine

## 2019-03-21 ENCOUNTER — Other Ambulatory Visit: Payer: Self-pay

## 2019-03-21 ENCOUNTER — Other Ambulatory Visit: Payer: BC Managed Care – PPO

## 2019-03-21 DIAGNOSIS — Z79899 Other long term (current) drug therapy: Secondary | ICD-10-CM

## 2019-03-21 DIAGNOSIS — I129 Hypertensive chronic kidney disease with stage 1 through stage 4 chronic kidney disease, or unspecified chronic kidney disease: Secondary | ICD-10-CM

## 2019-03-21 DIAGNOSIS — Z0000 Encounter for general adult medical examination without abnormal findings: Secondary | ICD-10-CM

## 2019-03-22 LAB — CMP14+EGFR
ALT: 16 IU/L (ref 0–32)
AST: 26 IU/L (ref 0–40)
Albumin/Globulin Ratio: 1.4 (ref 1.2–2.2)
Albumin: 4.3 g/dL (ref 3.8–4.8)
Alkaline Phosphatase: 55 IU/L (ref 39–117)
BUN/Creatinine Ratio: 16 (ref 9–23)
BUN: 24 mg/dL (ref 6–24)
Bilirubin Total: 0.4 mg/dL (ref 0.0–1.2)
CO2: 23 mmol/L (ref 20–29)
Calcium: 9.1 mg/dL (ref 8.7–10.2)
Chloride: 102 mmol/L (ref 96–106)
Creatinine, Ser: 1.49 mg/dL — ABNORMAL HIGH (ref 0.57–1.00)
GFR calc Af Amer: 50 mL/min/{1.73_m2} — ABNORMAL LOW (ref >59)
GFR calc non Af Amer: 43 mL/min/{1.73_m2} — ABNORMAL LOW (ref >59)
Globulin, Total: 3 g/dL (ref 1.5–4.5)
Glucose: 82 mg/dL (ref 65–99)
Potassium: 3.6 mmol/L (ref 3.5–5.2)
Sodium: 138 mmol/L (ref 134–144)
Total Protein: 7.3 g/dL (ref 6.0–8.5)

## 2019-03-22 LAB — CBC WITH DIFFERENTIAL/PLATELET
Basophils Absolute: 0 10*3/uL (ref 0.0–0.2)
Basos: 1 %
EOS (ABSOLUTE): 0 10*3/uL (ref 0.0–0.4)
Eos: 1 %
Hematocrit: 30 % — ABNORMAL LOW (ref 34.0–46.6)
Hemoglobin: 9.6 g/dL — ABNORMAL LOW (ref 11.1–15.9)
Immature Grans (Abs): 0 10*3/uL (ref 0.0–0.1)
Immature Granulocytes: 0 %
Lymphocytes Absolute: 0.8 10*3/uL (ref 0.7–3.1)
Lymphs: 22 %
MCH: 29 pg (ref 26.6–33.0)
MCHC: 32 g/dL (ref 31.5–35.7)
MCV: 91 fL (ref 79–97)
Monocytes Absolute: 0.3 10*3/uL (ref 0.1–0.9)
Monocytes: 8 %
Neutrophils Absolute: 2.5 10*3/uL (ref 1.4–7.0)
Neutrophils: 68 %
Platelets: 306 10*3/uL (ref 150–450)
RBC: 3.31 x10E6/uL — ABNORMAL LOW (ref 3.77–5.28)
RDW: 12.8 % (ref 11.7–15.4)
WBC: 3.7 10*3/uL (ref 3.4–10.8)

## 2019-03-22 LAB — LIPID PANEL
Chol/HDL Ratio: 2.3 ratio (ref 0.0–4.4)
Cholesterol, Total: 163 mg/dL (ref 100–199)
HDL: 71 mg/dL (ref >39)
LDL Chol Calc (NIH): 79 mg/dL (ref 0–99)
Triglycerides: 68 mg/dL (ref 0–149)
VLDL Cholesterol Cal: 13 mg/dL (ref 5–40)

## 2019-03-22 LAB — HEMOGLOBIN A1C
Est. average glucose Bld gHb Est-mCnc: 105 mg/dL
Hgb A1c MFr Bld: 5.3 % (ref 4.8–5.6)

## 2019-03-25 ENCOUNTER — Other Ambulatory Visit: Payer: Self-pay

## 2019-03-25 ENCOUNTER — Encounter: Payer: Self-pay | Admitting: Internal Medicine

## 2019-03-25 ENCOUNTER — Ambulatory Visit: Payer: BC Managed Care – PPO | Admitting: Internal Medicine

## 2019-03-25 VITALS — BP 154/90 | HR 81 | Temp 98.3°F | Ht 65.0 in | Wt 134.2 lb

## 2019-03-25 DIAGNOSIS — Z0000 Encounter for general adult medical examination without abnormal findings: Secondary | ICD-10-CM

## 2019-03-25 DIAGNOSIS — N183 Chronic kidney disease, stage 3 unspecified: Secondary | ICD-10-CM

## 2019-03-25 DIAGNOSIS — I129 Hypertensive chronic kidney disease with stage 1 through stage 4 chronic kidney disease, or unspecified chronic kidney disease: Secondary | ICD-10-CM

## 2019-03-25 LAB — POCT UA - MICROALBUMIN
Albumin/Creatinine Ratio, Urine, POC: <30
Creatinine, POC: 200 mg/dL
Microalbumin Ur, POC: 10 mg/L

## 2019-03-25 LAB — POCT URINALYSIS DIPSTICK
Bilirubin, UA: NEGATIVE
Blood, UA: NEGATIVE
Glucose, UA: NEGATIVE
Ketones, UA: NEGATIVE
Leukocytes, UA: NEGATIVE
Nitrite, UA: NEGATIVE
Protein, UA: NEGATIVE
Spec Grav, UA: 1.025 (ref 1.010–1.025)
Urobilinogen, UA: 0.2 E.U./dL
pH, UA: 5.5 (ref 5.0–8.0)

## 2019-03-25 MED ORDER — LEVOCETIRIZINE DIHYDROCHLORIDE 5 MG PO TABS: 5.0000 mg | ORAL_TABLET | Freq: Every evening | ORAL | 1 refills | Status: DC

## 2019-03-25 NOTE — Progress Notes (Signed)
Subjective:     Patient ID: Shelby Campbell , female    DOB: 10/22/1975 , 43 y.o.   MRN: [id number]   Chief Complaint  Patient presents with  . Annual Exam    HPI  She is here today for a full physical examinations. She is followed by Dr. Garwin Brothers for her GYN exam. She was seen last month for both her pap and mammogram. She admits that she has not taken her bp meds today.   Hypertension This is a chronic problem. The current episode started more than 1 year ago. The problem has been gradually improving since onset. The problem is uncontrolled. Pertinent negatives include no blurred vision, chest pain, palpitations or shortness of breath. The current treatment provides moderate improvement. Hypertensive end-organ damage includes kidney disease.     Past Medical History:  Diagnosis Date  . Asthma   . Chronic kidney disease    sees Dr Erling Cruz- stage III per note   . Headache    Migraines  . Hiatal hernia   . Hypertension   . Mitral valve prolapse   . 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 Shortness of breath    related to asthma      Family History  Problem Relation Age of Onset  . Hypertension Mother   . Arthritis Mother   . Hypertension Father   . Prostate cancer Father   . Arthritis Father   . Gout Father   . Healthy Son   . Healthy Daughter      Current Outpatient Medications:  .  albuterol (VENTOLIN HFA) 108 (90 Base) MCG/ACT inhaler, Inhale 2 puffs into the lungs every 6 (six) hours as needed for wheezing or shortness of breath., Disp: 18 g, Rfl: 11 .  cholecalciferol (VITAMIN D) 1000 UNITS tablet, Take 1,000 Units by mouth daily., Disp: , Rfl:  .  levocetirizine (XYZAL) 5 MG tablet, Take 1 tablet (5 mg total) by mouth every evening., Disp: 90 tablet, Rfl: 1 .  lisinopril-hydrochlorothiazide (PRINZIDE,ZESTORETIC) 20-12.5 MG tablet, Take 1 tablet by mouth every evening., Disp: 90 tablet, Rfl: 2 .  magnesium oxide (MAG-OX) 400 MG tablet, Take 800 mg by mouth  daily., Disp: , Rfl:  .  metoprolol succinate (TOPROL-XL) 25 MG 24 hr tablet, Take 1 tablet (25 mg total) by mouth daily., Disp: 90 tablet, Rfl: 2 .  mometasone-formoterol (DULERA) 200-5 MCG/ACT AERO, Inhale 2 puffs into the lungs 2 (two) times daily., Disp: , Rfl:    Allergies  Allergen Reactions  . Other Other (See Comments)    Sulfonamides, sulfonylureas, barbiturates, antifungals, ketamine, etomidate, rifapentine, rifampicin, rifabutine, nitrofurantoin, metronidazole, ergot derivatives, indinavir, nevirapine, ritonavir, saquinavir, progestogens, progesterones, carbamazepine, phenytoin, valproate, oxycodone, pentazocine, cocaine, gold antirheumatics agents, sodium aurothiomalate, methyldopa, fenfluramine, ethosuzimide, flupentixol, flutamide, disulfiram, lidocaine   . Sulfa Antibiotics Other (See Comments)    Heart beating fast      The patient states she uses none for birth control. Last LMP was No LMP recorded. Patient has had an ablation.. Negative for Dysmenorrhea  Negative for: breast discharge, breast lump(s), breast pain and breast self exam. Associated symptoms include abnormal vaginal bleeding. Pertinent negatives include abnormal bleeding (hematology), anxiety, decreased libido, depression, difficulty falling sleep, dyspareunia, history of infertility, nocturia, sexual dysfunction, sleep disturbances, urinary incontinence, urinary urgency, vaginal discharge and vaginal itching. Diet regular.The patient states her exercise level is  moderately vigorous.   . The patient's tobacco use is:  Social History   Tobacco Use  Smoking  Status Never Smoker  Smokeless Tobacco Never Used  . She has been exposed to passive smoke. The patient's alcohol use is:  Social History   Substance and Sexual Activity  Alcohol Use Yes   Comment: occasional    Review of Systems  Constitutional: Negative.   HENT: Negative.   Eyes: Negative.  Negative for blurred vision.  Respiratory: Negative.   Negative for shortness of breath.   Cardiovascular: Negative.  Negative for chest pain and palpitations.  Endocrine: Negative.   Genitourinary: Negative.   Musculoskeletal: Negative.   Skin: Negative.   Allergic/Immunologic: Negative.   Neurological: Negative.   Hematological: Negative.   Psychiatric/Behavioral: Negative.      Today's Vitals   03/25/19 1516  BP: (!) 154/90  Pulse: 81  Temp: 98.3 F (36.8 C)  TempSrc: Oral  Weight: 134 lb 3.2 oz (60.9 kg)  Height: 5\' 5"  (1.651 m)   Body mass index is 22.33 kg/m.   Objective:  Physical Exam Vitals signs and nursing note reviewed.  Constitutional:      Appearance: Normal appearance.  HENT:     Head: Normocephalic and atraumatic.     Right Ear: Tympanic membrane, ear canal and external ear normal.     Left Ear: Tympanic membrane, ear canal and external ear normal.     Nose: Nose normal.     Mouth/Throat:     Mouth: Mucous membranes are moist.     Pharynx: Oropharynx is clear.  Eyes:     Extraocular Movements: Extraocular movements intact.     Conjunctiva/sclera: Conjunctivae normal.     Pupils: Pupils are equal, round, and reactive to light.  Neck:     Musculoskeletal: Normal range of motion and neck supple.  Cardiovascular:     Rate and Rhythm: Normal rate and regular rhythm.     Pulses: Normal pulses.     Heart sounds: Normal heart sounds.  Pulmonary:     Effort: Pulmonary effort is normal.     Breath sounds: Normal breath sounds.  Chest:     Breasts: Tanner Score is 5.        Right: Normal.        Left: Normal.  Abdominal:     General: Abdomen is flat. Bowel sounds are normal.     Palpations: Abdomen is soft.  Genitourinary:    Comments: deferred Musculoskeletal: Normal range of motion.  Skin:    General: Skin is warm and dry.  Neurological:     General: No focal deficit present.     Mental Status: She is alert and oriented to person, place, and time.  Psychiatric:        Mood and Affect: Mood normal.         Behavior: Behavior normal.         Assessment And Plan:     1. Routine general medical examination at health care facility  A full exam was performed.  Importance of monthly self breast exams was discussed with the patient.  She had her labs drawn a week ago, we went over her results in full detail during her visit.  PATIENT HAS BEEN ADVISED TO GET 30-45 MINUTES REGULAR EXERCISE NO LESS THAN FOUR TO FIVE DAYS PER WEEK - BOTH WEIGHTBEARING EXERCISES AND AEROBIC ARE RECOMMENDED.  SHE WAS ADVISED TO FOLLOW A HEALTHY DIET WITH AT LEAST SIX FRUITS/VEGGIES PER DAY, DECREASE INTAKE OF RED MEAT, AND TO INCREASE FISH INTAKE TO TWO DAYS PER WEEK.  MEATS/FISH SHOULD NOT BE FRIED, BAKED  OR BROILED IS PREFERABLE.  I SUGGEST WEARING SPF 50 SUNSCREEN ON EXPOSED PARTS AND ESPECIALLY WHEN IN THE DIRECT SUNLIGHT FOR AN EXTENDED PERIOD OF TIME.  PLEASE AVOID FAST FOOD RESTAURANTS AND INCREASE YOUR WATER INTAKE.   2. Hypertensive nephropathy  Chronic, uncontrolled.  She is encouraged to take her meds as soon as she gets home. Importance of dietary and medication compliance was discussed with the patient. She will rto in 3 months for re-evaluation. EKG performed, no new changes noted.   - EKG 12-Lead  3. Stage 3 chronic kidney disease, unspecified whether stage 3a or 3b CKD  Chronic, this has been stable. Importance of adequate hydration and optimal bp control to avoid progression of CKD was discussed with the patient.   Maximino Greenland, MD    THE PATIENT IS ENCOURAGED TO PRACTICE SOCIAL DISTANCING DUE TO THE COVID-19 PANDEMIC.

## 2019-03-25 NOTE — Patient Instructions (Signed)
Health Maintenance, Female Adopting a healthy lifestyle and getting preventive care are important in promoting health and wellness. Ask your health care provider about:  The right schedule for you to have regular tests and exams.  Things you can do on your own to prevent diseases and keep yourself healthy. What should I know about diet, weight, and exercise? Eat a healthy diet   Eat a diet that includes plenty of vegetables, fruits, low-fat dairy products, and lean protein.  Do not eat a lot of foods that are high in solid fats, added sugars, or sodium. Maintain a healthy weight Body mass index (BMI) is used to identify weight problems. It estimates body fat based on height and weight. Your health care provider can help determine your BMI and help you achieve or maintain a healthy weight. Get regular exercise Get regular exercise. This is one of the most important things you can do for your health. Most adults should:  Exercise for at least 150 minutes each week. The exercise should increase your heart rate and make you sweat (moderate-intensity exercise).  Do strengthening exercises at least twice a week. This is in addition to the moderate-intensity exercise.  Spend less time sitting. Even light physical activity can be beneficial. Watch cholesterol and blood lipids Have your blood tested for lipids and cholesterol at 41 years of age, then have this test every 5 years. Have your cholesterol levels checked more often if:  Your lipid or cholesterol levels are high.  You are older than 40 years of age.  You are at high risk for heart disease. What should I know about cancer screening? Depending on your health history and family history, you may need to have cancer screening at various ages. This may include screening for:  Breast cancer.  Cervical cancer.  Colorectal cancer.  Skin cancer.  Lung cancer. What should I know about heart disease, diabetes, and high blood  pressure? Blood pressure and heart disease  High blood pressure causes heart disease and increases the risk of stroke. This is more likely to develop in people who have high blood pressure readings, are of African descent, or are overweight.  Have your blood pressure checked: ? Every 3-5 years if you are 18-39 years of age. ? Every year if you are 40 years old or older. Diabetes Have regular diabetes screenings. This checks your fasting blood sugar level. Have the screening done:  Once every three years after age 40 if you are at a normal weight and have a low risk for diabetes.  More often and at a younger age if you are overweight or have a high risk for diabetes. What should I know about preventing infection? Hepatitis B If you have a higher risk for hepatitis B, you should be screened for this virus. Talk with your health care provider to find out if you are at risk for hepatitis B infection. Hepatitis C Testing is recommended for:  Everyone born from 1945 through 1965.  Anyone with known risk factors for hepatitis C. Sexually transmitted infections (STIs)  Get screened for STIs, including gonorrhea and chlamydia, if: ? You are sexually active and are younger than 41 years of age. ? You are older than 41 years of age and your health care provider tells you that you are at risk for this type of infection. ? Your sexual activity has changed since you were last screened, and you are at increased risk for chlamydia or gonorrhea. Ask your health care provider if   you are at risk.  Ask your health care provider about whether you are at high risk for HIV. Your health care provider may recommend a prescription medicine to help prevent HIV infection. If you choose to take medicine to prevent HIV, you should first get tested for HIV. You should then be tested every 3 months for as long as you are taking the medicine. Pregnancy  If you are about to stop having your period (premenopausal) and  you may become pregnant, seek counseling before you get pregnant.  Take 400 to 800 micrograms (mcg) of folic acid every day if you become pregnant.  Ask for birth control (contraception) if you want to prevent pregnancy. Osteoporosis and menopause Osteoporosis is a disease in which the bones lose minerals and strength with aging. This can result in bone fractures. If you are 65 years old or older, or if you are at risk for osteoporosis and fractures, ask your health care provider if you should:  Be screened for bone loss.  Take a calcium or vitamin D supplement to lower your risk of fractures.  Be given hormone replacement therapy (HRT) to treat symptoms of menopause. Follow these instructions at home: Lifestyle  Do not use any products that contain nicotine or tobacco, such as cigarettes, e-cigarettes, and chewing tobacco. If you need help quitting, ask your health care provider.  Do not use street drugs.  Do not share needles.  Ask your health care provider for help if you need support or information about quitting drugs. Alcohol use  Do not drink alcohol if: ? Your health care provider tells you not to drink. ? You are pregnant, may be pregnant, or are planning to become pregnant.  If you drink alcohol: ? Limit how much you use to 0-1 drink a day. ? Limit intake if you are breastfeeding.  Be aware of how much alcohol is in your drink. In the [place] one drink equals one 12 oz bottle of beer (355 mL), one 5 oz glass of wine (148 mL), or one 1 oz glass of hard liquor (44 mL). General instructions  Schedule regular health, dental, and eye exams.  Stay current with your vaccines.  Tell your health care provider if: ? You often feel depressed. ? You have ever been abused or do not feel safe at home. Summary  Adopting a healthy lifestyle and getting preventive care are important in promoting health and wellness.  Follow your health care provider's instructions about healthy  diet, exercising, and getting tested or screened for diseases.  Follow your health care provider's instructions on monitoring your cholesterol and blood pressure. This information is not intended to replace advice given to you by your health care provider. Make sure you discuss any questions you have with your health care provider. Document Released: 11/08/2010 Document Revised: 04/18/2018 Document Reviewed: 04/18/2018 Elsevier Patient Education  2020 [employer]  

## 2019-03-27 LAB — IRON AND TIBC
Iron Saturation: 7 % — CL (ref 15–55)
Iron: 24 ug/dL — ABNORMAL LOW (ref 27–159)
Total Iron Binding Capacity: 357 ug/dL (ref 250–450)
UIBC: 333 ug/dL (ref 131–425)

## 2019-03-27 LAB — FERRITIN: Ferritin: 12 ng/mL — ABNORMAL LOW (ref 15–150)

## 2019-03-27 LAB — SPECIMEN STATUS REPORT

## 2019-04-30 ENCOUNTER — Other Ambulatory Visit (INDEPENDENT_AMBULATORY_CARE_PROVIDER_SITE_OTHER): Payer: BC Managed Care – PPO

## 2019-04-30 DIAGNOSIS — Z1211 Encounter for screening for malignant neoplasm of colon: Secondary | ICD-10-CM

## 2019-04-30 DIAGNOSIS — D649 Anemia, unspecified: Secondary | ICD-10-CM

## 2019-04-30 LAB — HEMOCCULT GUIAC POC 1CARD (OFFICE)
Card #2 Fecal Occult Blod, POC: NEGATIVE
Card #3 Fecal Occult Blood, POC: NEGATIVE
Fecal Occult Blood, POC: NEGATIVE

## 2019-05-10 DIAGNOSIS — N1330 Unspecified hydronephrosis: Secondary | ICD-10-CM

## 2019-05-10 HISTORY — DX: Unspecified hydronephrosis: N13.30

## 2019-06-25 ENCOUNTER — Ambulatory Visit: Payer: BC Managed Care – PPO | Admitting: Internal Medicine

## 2019-06-25 ENCOUNTER — Encounter: Payer: Self-pay | Admitting: Internal Medicine

## 2019-06-25 ENCOUNTER — Telehealth: Payer: Self-pay | Admitting: Internal Medicine

## 2019-06-25 ENCOUNTER — Other Ambulatory Visit: Payer: Self-pay

## 2019-06-25 VITALS — BP 118/76 | HR 73 | Temp 97.7°F | Ht 65.0 in | Wt 130.0 lb

## 2019-06-25 DIAGNOSIS — I129 Hypertensive chronic kidney disease with stage 1 through stage 4 chronic kidney disease, or unspecified chronic kidney disease: Secondary | ICD-10-CM

## 2019-06-25 DIAGNOSIS — K5909 Other constipation: Secondary | ICD-10-CM

## 2019-06-25 DIAGNOSIS — Z79899 Other long term (current) drug therapy: Secondary | ICD-10-CM

## 2019-06-25 DIAGNOSIS — J4520 Mild intermittent asthma, uncomplicated: Secondary | ICD-10-CM

## 2019-06-25 DIAGNOSIS — N183 Chronic kidney disease, stage 3 unspecified: Secondary | ICD-10-CM

## 2019-06-25 DIAGNOSIS — D649 Anemia, unspecified: Secondary | ICD-10-CM

## 2019-06-25 MED ORDER — LEVOCETIRIZINE DIHYDROCHLORIDE 5 MG PO TABS: 5.0000 mg | ORAL_TABLET | Freq: Every evening | ORAL | 1 refills | Status: DC

## 2019-06-25 MED ORDER — LISINOPRIL-HYDROCHLOROTHIAZIDE 20-12.5 MG PO TABS: 1.0000 | ORAL_TABLET | Freq: Every evening | ORAL | 2 refills | Status: DC

## 2019-06-25 MED ORDER — DULERA 200-5 MCG/ACT IN AERO: 2.0000 | INHALATION_SPRAY | Freq: Two times a day (BID) | RESPIRATORY_TRACT | 1 refills | Status: DC

## 2019-06-25 MED ORDER — ALBUTEROL SULFATE HFA 108 (90 BASE) MCG/ACT IN AERS: 2.0000 | INHALATION_SPRAY | Freq: Four times a day (QID) | RESPIRATORY_TRACT | 11 refills | Status: DC | PRN

## 2019-06-25 NOTE — Patient Instructions (Signed)
Asthma, Adult  Asthma is a long-term (chronic) condition in which the airways get tight and narrow. The airways are the breathing passages that lead from the nose and mouth down into the lungs. A person with asthma will have times when symptoms get worse. These are called asthma attacks. They can cause coughing, whistling sounds when you breathe (wheezing), shortness of breath, and chest pain. They can make it hard to breathe. There is no cure for asthma, but medicines and lifestyle changes can help control it. There are many things that can bring on an asthma attack or make asthma symptoms worse (triggers). Common triggers include:  Mold.  Dust.  Cigarette smoke.  Cockroaches.  Things that can cause allergy symptoms (allergens). These include animal skin flakes (dander) and pollen from trees or grass.  Things that pollute the air. These may include household cleaners, wood smoke, smog, or chemical odors.  Cold air, weather changes, and wind.  Crying or laughing hard.  Stress.  Certain medicines or drugs.  Certain foods such as dried fruit, potato chips, and grape juice.  Infections, such as a cold or the flu.  Certain medical conditions or diseases.  Exercise or tiring activities. Asthma may be treated with medicines and by staying away from the things that cause asthma attacks. Types of medicines may include:  Controller medicines. These help prevent asthma symptoms. They are usually taken every day.  Fast-acting reliever or rescue medicines. These quickly relieve asthma symptoms. They are used as needed and provide short-term relief.  Allergy medicines if your attacks are brought on by allergens.  Medicines to help control the body's defense (immune) system. Follow these instructions at home: Avoiding triggers in your home  Change your heating and air conditioning filter often.  Limit your use of fireplaces and wood stoves.  Get rid of pests (such as roaches and  mice) and their droppings.  Throw away plants if you see mold on them.  Clean your floors. Dust regularly. Use cleaning products that do not smell.  Have someone vacuum when you are not home. Use a vacuum cleaner with a HEPA filter if possible.  Replace carpet with wood, tile, or vinyl flooring. Carpet can trap animal skin flakes and dust.  Use allergy-proof pillows, mattress covers, and box spring covers.  Wash bed sheets and blankets every week in hot water. Dry them in a dryer.  Keep your bedroom free of any triggers.  Avoid pets and keep windows closed when things that cause allergy symptoms are in the air.  Use blankets that are made of polyester or cotton.  Clean bathrooms and kitchens with bleach. If possible, have someone repaint the walls in these rooms with mold-resistant paint. Keep out of the rooms that are being cleaned and painted.  Wash your hands often with soap and water. If soap and water are not available, use hand sanitizer.  Do not allow anyone to smoke in your home. General instructions  Take over-the-counter and prescription medicines only as told by your doctor. ? Talk with your doctor if you have questions about how or when to take your medicines. ? Make note if you need to use your medicines more often than usual.  Do not use any products that contain nicotine or tobacco, such as cigarettes and e-cigarettes. If you need help quitting, ask your doctor.  Stay away from secondhand smoke.  Avoid doing things outdoors when allergen counts are high and when air quality is low.  Wear a ski mask   when doing outdoor activities in the winter. The mask should cover your nose and mouth. Exercise indoors on cold days if you can.  Warm up before you exercise. Take time to cool down after exercise.  Use a peak flow meter as told by your doctor. A peak flow meter is a tool that measures how well the lungs are working.  Keep track of the peak flow meter's readings.  Write them down.  Follow your asthma action plan. This is a written plan for taking care of your asthma and treating your attacks.  Make sure you get all the shots (vaccines) that your doctor recommends. Ask your doctor about a flu shot and a pneumonia shot.  Keep all follow-up visits as told by your doctor. This is important. Contact a doctor if:  You have wheezing, shortness of breath, or a cough even while taking medicine to prevent attacks.  The mucus you cough up (sputum) is thicker than usual.  The mucus you cough up changes from clear or white to yellow, green, gray, or bloody.  You have problems from the medicine you are taking, such as: ? A rash. ? Itching. ? Swelling. ? Trouble breathing.  You need reliever medicines more than 2-3 times a week.  Your peak flow reading is still at 50-79% of your personal best after following the action plan for 1 hour.  You have a fever. Get help right away if:  You seem to be worse and are not responding to medicine during an asthma attack.  You are short of breath even at rest.  You get short of breath when doing very little activity.  You have trouble eating, drinking, or talking.  You have chest pain or tightness.  You have a fast heartbeat.  Your lips or fingernails start to turn blue.  You are light-headed or dizzy, or you faint.  Your peak flow is less than 50% of your personal best.  You feel too tired to breathe normally. Summary  Asthma is a long-term (chronic) condition in which the airways get tight and narrow. An asthma attack can make it hard to breathe.  Asthma cannot be cured, but medicines and lifestyle changes can help control it.  Make sure you understand how to avoid triggers and how and when to use your medicines. This information is not intended to replace advice given to you by your health care provider. Make sure you discuss any questions you have with your health care provider. Document Revised:  06/28/2018 Document Reviewed: 05/30/2016 Elsevier Patient Education  2020 [employer]vier Inc.  

## 2019-06-25 NOTE — Progress Notes (Signed)
This visit occurred during the SARS-CoV-2 public health emergency.  Safety protocols were in place, including screening questions prior to the visit, additional usage of staff PPE, and extensive cleaning of exam room while observing appropriate contact time as indicated for disinfecting solutions.  Subjective:     Patient ID: Shelby Campbell , female    DOB: 12/09/1975 , 44 y.o.   MRN: [id number]   Chief Complaint  Patient presents with  . Hypertension    HPI  She presents today for bp check. She feels well on her current meds. Admits she has not been seen by Renal since Dr. Powell moved to another position.   Hypertension This is a chronic problem. The current episode started more than 1 year ago. The problem has been gradually improving since onset. The problem is controlled. Pertinent negatives include no blurred vision, chest pain, palpitations or shortness of breath. Past treatments include ACE inhibitors. The current treatment provides moderate improvement. Hypertensive end-organ damage includes kidney disease.     Past Medical History:  Diagnosis Date  . Asthma   . Chronic kidney disease    sees Dr Alvin Powell- stage III per note   . Headache    Migraines  . Hiatal hernia   . Hypertension   . Mitral valve prolapse   . Porphyria (HCC)   . Porphyria (HCC)   . Shortness of breath    related to asthma      Family History  Problem Relation Age of Onset  . Hypertension Mother   . Arthritis Mother   . Hypertension Father   . Prostate cancer Father   . Arthritis Father   . Gout Father   . Healthy Son   . Healthy Daughter      Current Outpatient Medications:  .  albuterol (VENTOLIN HFA) 108 (90 Base) MCG/ACT inhaler, Inhale 2 puffs into the lungs every 6 (six) hours as needed for wheezing or shortness of breath., Disp: 18 g, Rfl: 11 .  Cholecalciferol (VITAMIN D) 125 MCG (5000 UT) CAPS, Take 1,000 Units by mouth daily. , Disp: , Rfl:  .  lisinopril-hydrochlorothiazide  (ZESTORETIC) 20-12.5 MG tablet, Take 1 tablet by mouth every evening., Disp: 90 tablet, Rfl: 2 .  MAGNESIUM OXIDE PO, Take 200 mg by mouth daily. powder, Disp: , Rfl:  .  metoprolol succinate (TOPROL-XL) 25 MG 24 hr tablet, Take 1 tablet (25 mg total) by mouth daily., Disp: 90 tablet, Rfl: 2 .  mometasone-formoterol (DULERA) 200-5 MCG/ACT AERO, Inhale 2 puffs into the lungs 2 (two) times daily., Disp: 13 g, Rfl: 1 .  levocetirizine (XYZAL) 5 MG tablet, Take 1 tablet (5 mg total) by mouth every evening. (Patient not taking: Reported on 06/25/2019), Disp: 90 tablet, Rfl: 1   Allergies  Allergen Reactions  . Other Other (See Comments)    Sulfonamides, sulfonylureas, barbiturates, antifungals, ketamine, etomidate, rifapentine, rifampicin, rifabutine, nitrofurantoin, metronidazole, ergot derivatives, indinavir, nevirapine, ritonavir, saquinavir, progestogens, progesterones, carbamazepine, phenytoin, valproate, oxycodone, pentazocine, cocaine, gold antirheumatics agents, sodium aurothiomalate, methyldopa, fenfluramine, ethosuzimide, flupentixol, flutamide, disulfiram, lidocaine   . Sulfa Antibiotics Other (See Comments)    Heart beating fast      Review of Systems  Constitutional: Negative.   Eyes: Negative for blurred vision.  Respiratory: Negative.  Negative for shortness of breath.   Cardiovascular: Negative.  Negative for chest pain and palpitations.  Gastrointestinal: Positive for constipation.       She c/o constipation, started about 3 weeks ago. Denies change in her diet. Admits   she has not been exercising as much.   Neurological: Negative.   Psychiatric/Behavioral: Negative.  Dysphoric mood:      Today's Vitals   06/25/19 1020  BP: 118/76  Pulse: 73  Temp: 97.7 F (36.5 C)  TempSrc: Oral  Weight: 130 lb (59 kg)  Height: 5' 5" (1.651 m)   Body mass index is 21.63 kg/m.   Objective:  Physical Exam Vitals and nursing note reviewed.  Constitutional:      Appearance: Normal  appearance.  HENT:     Head: Normocephalic and atraumatic.  Cardiovascular:     Rate and Rhythm: Normal rate and regular rhythm.     Heart sounds: Normal heart sounds.  Pulmonary:     Effort: Pulmonary effort is normal.     Breath sounds: Normal breath sounds.  Skin:    General: Skin is warm.  Neurological:     General: No focal deficit present.     Mental Status: She is alert.  Psychiatric:        Mood and Affect: Mood normal.        Behavior: Behavior normal.         Assessment And Plan:     1. Hypertensive nephropathy  Chronic, well controlled. She will continue with current meds. She is encouraged to avoid adding salt to her foods. I will check renal function today.   - CMP14+EGFR  2. Stage 3 chronic kidney disease, unspecified whether stage 3a or 3b CKD  Chronic, yet this has been stable. I will refer her to re-establish with [facility]. She is encouraged to keep BP well controlled and to stay well hydrated to prevent further progression of CKD.   - Ambulatory referral to Nephrology  3. Other constipation  She will try OTC Miralax daily. If ineffective, I will consider use of Linzess. She will let me know if her sx persist. She is encouraged to maintain adequate fiber/water intake.   4. Anemia, unspecified type  Chronic, possibly related to CKD. I will check iron levels today.   - CBC with Diff - Iron and IBC ([id number]) - Ferritin  5. Mild intermittent asthma without complication  Chronic, controlled. She will continue with Dulera, 263mg 2 puffs twice daily.   6. Drug therapy   RMaximino Greenland MD    THE PATIENT IS ENCOURAGED TO PRACTICE SOCIAL DISTANCING DUE TO THE COVID-19 PANDEMIC.

## 2019-06-25 NOTE — Telephone Encounter (Signed)
PA done for [facility] 200-5MCG/ACT Aerosol. Awaiting determination

## 2019-06-26 LAB — CMP14+EGFR
ALT: 14 IU/L (ref 0–32)
AST: 23 IU/L (ref 0–40)
Albumin/Globulin Ratio: 1.6 (ref 1.2–2.2)
Albumin: 4.4 g/dL (ref 3.8–4.8)
Alkaline Phosphatase: 53 IU/L (ref 39–117)
BUN/Creatinine Ratio: 16 (ref 9–23)
BUN: 21 mg/dL (ref 6–24)
Bilirubin Total: 0.3 mg/dL (ref 0.0–1.2)
CO2: 23 mmol/L (ref 20–29)
Calcium: 9.3 mg/dL (ref 8.7–10.2)
Chloride: 101 mmol/L (ref 96–106)
Creatinine, Ser: 1.35 mg/dL — ABNORMAL HIGH (ref 0.57–1.00)
GFR calc Af Amer: 56 mL/min/{1.73_m2} — ABNORMAL LOW (ref >59)
GFR calc non Af Amer: 49 mL/min/{1.73_m2} — ABNORMAL LOW (ref >59)
Globulin, Total: 2.8 g/dL (ref 1.5–4.5)
Glucose: 69 mg/dL (ref 65–99)
Potassium: 4.1 mmol/L (ref 3.5–5.2)
Sodium: 137 mmol/L (ref 134–144)
Total Protein: 7.2 g/dL (ref 6.0–8.5)

## 2019-06-26 LAB — FERRITIN: Ferritin: 15 ng/mL (ref 15–150)

## 2019-06-26 LAB — CBC WITH DIFFERENTIAL/PLATELET
Basophils Absolute: 0 10*3/uL (ref 0.0–0.2)
Basos: 1 %
EOS (ABSOLUTE): 0 10*3/uL (ref 0.0–0.4)
Eos: 1 %
Hematocrit: 37 % (ref 34.0–46.6)
Hemoglobin: 11.5 g/dL (ref 11.1–15.9)
Immature Grans (Abs): 0 10*3/uL (ref 0.0–0.1)
Immature Granulocytes: 0 %
Lymphocytes Absolute: 0.9 10*3/uL (ref 0.7–3.1)
Lymphs: 24 %
MCH: 27.3 pg (ref 26.6–33.0)
MCHC: 31.1 g/dL — ABNORMAL LOW (ref 31.5–35.7)
MCV: 88 fL (ref 79–97)
Monocytes Absolute: 0.3 10*3/uL (ref 0.1–0.9)
Monocytes: 9 %
Neutrophils Absolute: 2.3 10*3/uL (ref 1.4–7.0)
Neutrophils: 65 %
Platelets: 254 10*3/uL (ref 150–450)
RBC: 4.21 x10E6/uL (ref 3.77–5.28)
RDW: 15.6 % — ABNORMAL HIGH (ref 11.7–15.4)
WBC: 3.6 10*3/uL (ref 3.4–10.8)

## 2019-06-26 LAB — IRON AND TIBC
Iron Saturation: 25 % (ref 15–55)
Iron: 93 ug/dL (ref 27–159)
Total Iron Binding Capacity: 378 ug/dL (ref 250–450)
UIBC: 285 ug/dL (ref 131–425)

## 2019-07-22 NOTE — Progress Notes (Signed)
Office Visit Note  Patient: Shelby Campbell             Date of Birth: [birth year]           MRN: [id number]             PCP: Shelby Chard, MD Referring: Shelby Chard, MD Visit Date: 07/30/2019 Occupation: @GUAROCC @  Subjective:  Dry eyes   History of Present Illness: Shelby Campbell is a 44 y.o. female with history of sicca symptoms.  She continues to have dry eyes.  She states her dry mouth is not as bad.  She is not having has been joint discomfort as the last visit.  She denies any joint swelling.  Patient states that she has been experiencing some tingling in her left fourth and fifth finger recently.  She does desk work all day.  Activities of Daily Living:  Patient reports morning stiffness for 30 minutes.   Patient Denies nocturnal pain.  Difficulty dressing/grooming: Denies Difficulty climbing stairs: Denies Difficulty getting out of chair: Denies Difficulty using hands for taps, buttons, cutlery, and/or writing: Denies  Review of Systems  Constitutional: Positive for fatigue. Negative for night sweats, weight gain and weight loss.  HENT: Positive for mouth dryness. Negative for mouth sores, trouble swallowing, trouble swallowing and nose dryness.   Eyes: Positive for dryness. Negative for pain, redness and visual disturbance.  Respiratory: Negative for cough, shortness of breath and difficulty breathing.   Cardiovascular: Negative for chest pain, palpitations, hypertension, irregular heartbeat and swelling in legs/feet.  Gastrointestinal: Negative for blood in stool, constipation and diarrhea.  Endocrine: Positive for increased urination. Negative for cold intolerance, heat intolerance and excessive thirst.  Genitourinary: Negative for difficulty urinating and vaginal dryness.  Musculoskeletal: Positive for morning stiffness. Negative for arthralgias, joint pain, joint swelling, myalgias, muscle weakness, muscle tenderness and myalgias.  Skin: Negative for color change,  rash, hair loss, skin tightness, ulcers and sensitivity to sunlight.  Allergic/Immunologic: Negative for susceptible to infections.  Neurological: Negative for dizziness, numbness, memory loss, night sweats and weakness.  Hematological: Negative for bruising/bleeding tendency and swollen glands.  Psychiatric/Behavioral: Negative for depressed mood and sleep disturbance. The patient is not nervous/anxious.     PMFS History:  Patient Active Problem List   Diagnosis Date Noted  . Chronic renal disease, stage III 04/11/2018  . Asthma 03/01/2018  . Benign hypertension with chronic kidney disease 02/26/2018  . Chronic gastritis, mild.  H Pylori negative 07/31/2013  . Chronic cholecystitis with calculus 07/31/2013    Past Medical History:  Diagnosis Date  . Asthma   . Chronic kidney disease    sees Dr Erling Cruz- stage III per note   . Headache    Migraines  . Hiatal hernia   . Hypertension   . Mitral valve prolapse   . Porphyria ([place])   . Porphyria ([place])   . Shortness of breath    related to asthma     Family History  Problem Relation Age of Onset  . Hypertension Mother   . Arthritis Mother   . Hypertension Father   . Prostate cancer Father   . Arthritis Father   . Gout Father   . Healthy Son   . Healthy Daughter    Past Surgical History:  Procedure Laterality Date  . AXILLARY LYMPH NODE BIOPSY  2010   right   . CHOLECYSTECTOMY    . DILITATION & CURRETTAGE/HYSTROSCOPY WITH NOVASURE ABLATION N/A 05/04/2016   Procedure: DILATATION & CURETTAGE/HYSTEROSCOPY WITH  NOVASURE ABLATION;  Surgeon: Servando Salina, MD;  Location: [facility];  Service: Gynecology;  Laterality: N/A;  . LAPAROSCOPIC CHOLECYSTECTOMY SINGLE PORT N/A 09/10/2013   Procedure: LAPAROSCOPIC CHOLECYSTECTOMY SINGLE SITE WITH CHOLANGIOGRAM;  Surgeon: Adin Hector, MD;  Location: [facility];  Service: General;  Laterality: N/A;  . TUBAL LIGATION     Social History   Social History Narrative  . Not on file    Immunization History  Administered Date(s) Administered  . Influenza,inj,Quad PF,6+ Mos 03/01/2018  . Td 04/12/2003  . Tdap 12/20/2010     Objective: Vital Signs: BP 139/88 (BP Location: Left Arm, Patient Position: Sitting, Cuff Size: Normal)   Pulse 68   Resp 14   Ht 5\' 5"  (1.651 m)   Wt 134 lb 3.2 oz (60.9 kg)   BMI 22.33 kg/m    Physical Exam Vitals and nursing note reviewed.  Constitutional:      Appearance: She is well-developed.  HENT:     Head: Normocephalic and atraumatic.  Eyes:     Conjunctiva/sclera: Conjunctivae normal.  Cardiovascular:     Rate and Rhythm: Normal rate and regular rhythm.     Heart sounds: Normal heart sounds.  Pulmonary:     Effort: Pulmonary effort is normal.     Breath sounds: Normal breath sounds.  Abdominal:     General: Bowel sounds are normal.     Palpations: Abdomen is soft.  Musculoskeletal:     Cervical back: Normal range of motion.  Lymphadenopathy:     Cervical: No cervical adenopathy.  Skin:    General: Skin is warm and dry.     Capillary Refill: Capillary refill takes less than 2 seconds.  Neurological:     Mental Status: She is alert and oriented to person, place, and time.  Psychiatric:        Behavior: Behavior normal.      Musculoskeletal Exam: C-spine thoracic and lumbar spine with good range of motion.,  Shoulder joints, elbow joints, wrist joints with good range of motion.  She had no synovitis over MCPs PIPs or DIPs.  Hip joints, knee joints, ankles were in good range of motion.  No MTP tenderness was noted.  No synovitis was noted on the examination.  CDAI Exam: CDAI Score: - Patient Global: -; Provider Global: - Swollen: -; Tender: - Joint Exam 07/30/2019   No joint exam has been documented for this visit   There is currently no information documented on the homunculus. Go to the Rheumatology activity and complete the homunculus joint exam.  Investigation: No additional findings.  Imaging: No results  found.  Recent Labs: Lab Results  Component Value Date   WBC 3.6 06/25/2019   HGB 11.5 06/25/2019   PLT 254 06/25/2019   NA 137 06/25/2019   K 4.1 06/25/2019   CL 101 06/25/2019   CO2 23 06/25/2019   GLUCOSE 69 06/25/2019   BUN 21 06/25/2019   CREATININE 1.35 (H) 06/25/2019   BILITOT 0.3 06/25/2019   ALKPHOS 53 06/25/2019   AST 23 06/25/2019   ALT 14 06/25/2019   PROT 7.2 06/25/2019   ALBUMIN 4.4 06/25/2019   CALCIUM 9.3 06/25/2019   GFRAA 56 (L) 06/25/2019    Speciality Comments: No specialty comments available.  Procedures:  No procedures performed Allergies: Other and Sulfa antibiotics   Assessment / Plan:     Visit Diagnoses: Pain in both hands - All autoimmune work-up was negative.  X-rays were unremarkable.  Her hand discomfort has improved.  Patient has been experiencing some tingling in her left fourth and fifth finger.  She does work on [employer] for a long time.  I have advised her not to use armrest and also lean on the desk or chair.  If she has persistent symptoms we will refer her to neurology.  Primary osteoarthritis of both feet - mild.  She is currently not having much discomfort.  Positive ANA (antinuclear antibody) - ANA 1: 80 speckled, ENA negative, C3-C4 normal patient had no clinical features of autoimmune disease.  History of sicca symptoms. -She continues to have some sicca symptoms.  I will obtain autoimmune labs today.  She has been using over-the-counter products.  Plan: Anti-DNA antibody, double-stranded, Anti-Hornaday antibody, RNP Antibody, C3 and C4  Sicca syndrome (HCC) - ANA 1: 80 speckled, ENA negative.   - Plan: ANA, Sjogrens syndrome-A extractable nuclear antibody, Sjogrens syndrome-B extractable nuclear antibody  Family history of rheumatoid arthritis  Stage 3 chronic kidney disease, unspecified whether stage 3a or 3b CKD -patient used to see Dr. Florene Glen.  She has not established with a new nephrologist.  We will make a referral for her.   Plan: Ambulatory referral to Nephrology  History of gastritis  Benign hypertension with chronic kidney disease  Mild intermittent asthma without complication  Chronic cholecystitis with calculus  Orders: Orders Placed This Encounter  Procedures  . ANA  . Sjogrens syndrome-A extractable nuclear antibody  . Sjogrens syndrome-B extractable nuclear antibody  . Anti-DNA antibody, double-stranded  . Anti-Mcclurg antibody  . RNP Antibody  . C3 and C4  . Ambulatory referral to Nephrology   No orders of the defined types were placed in this encounter.   .  Follow-Up Instructions: Return in about 3 months (around 10/30/2019) for +sicca, low GFR.   Bo Merino, MD  Note - This record has been created using Editor, commissioning.  Chart creation errors have been sought, but may not always  have been located. Such creation errors do not reflect on  the standard of medical care.

## 2019-07-30 ENCOUNTER — Other Ambulatory Visit: Payer: Self-pay

## 2019-07-30 ENCOUNTER — Ambulatory Visit (INDEPENDENT_AMBULATORY_CARE_PROVIDER_SITE_OTHER): Payer: BC Managed Care – PPO | Admitting: Rheumatology

## 2019-07-30 ENCOUNTER — Encounter: Payer: Self-pay | Admitting: Rheumatology

## 2019-07-30 VITALS — BP 139/88 | HR 68 | Resp 14 | Ht 65.0 in | Wt 134.2 lb

## 2019-07-30 DIAGNOSIS — R768 Other specified abnormal immunological findings in serum: Secondary | ICD-10-CM | POA: Diagnosis not present

## 2019-07-30 DIAGNOSIS — Z8719 Personal history of other diseases of the digestive system: Secondary | ICD-10-CM

## 2019-07-30 DIAGNOSIS — M79641 Pain in right hand: Secondary | ICD-10-CM | POA: Diagnosis not present

## 2019-07-30 DIAGNOSIS — M3500 Sicca syndrome, unspecified: Secondary | ICD-10-CM | POA: Diagnosis not present

## 2019-07-30 DIAGNOSIS — K8010 Calculus of gallbladder with chronic cholecystitis without obstruction: Secondary | ICD-10-CM

## 2019-07-30 DIAGNOSIS — M19072 Primary osteoarthritis, left ankle and foot: Secondary | ICD-10-CM

## 2019-07-30 DIAGNOSIS — N183 Chronic kidney disease, stage 3 unspecified: Secondary | ICD-10-CM

## 2019-07-30 DIAGNOSIS — J4520 Mild intermittent asthma, uncomplicated: Secondary | ICD-10-CM

## 2019-07-30 DIAGNOSIS — I129 Hypertensive chronic kidney disease with stage 1 through stage 4 chronic kidney disease, or unspecified chronic kidney disease: Secondary | ICD-10-CM

## 2019-07-30 DIAGNOSIS — M19071 Primary osteoarthritis, right ankle and foot: Secondary | ICD-10-CM

## 2019-07-30 DIAGNOSIS — M79642 Pain in left hand: Secondary | ICD-10-CM

## 2019-07-30 DIAGNOSIS — Z8261 Family history of arthritis: Secondary | ICD-10-CM

## 2019-07-31 NOTE — Progress Notes (Signed)
All autoimmune labs are negative except for RNP which is low titer and not specific.  The labs do not indicate active autoimmune disease.  No change in treatment advised.

## 2019-08-01 LAB — ANTI-NUCLEAR AB-TITER (ANA TITER): ANA Titer 1: 1:80 {titer} — ABNORMAL HIGH

## 2019-08-01 LAB — ANA: Anti Nuclear Antibody (ANA): POSITIVE — AB

## 2019-08-01 LAB — RNP ANTIBODY: Ribonucleic Protein(ENA) Antibody, IgG: 1.1 AI — AB

## 2019-08-01 LAB — ANTI-SMITH ANTIBODY: ENA SM Ab Ser-aCnc: <1 AI

## 2019-08-01 LAB — C3 AND C4
C3 Complement: 119 mg/dL (ref 83–193)
C4 Complement: 29 mg/dL (ref 15–57)

## 2019-08-01 LAB — SJOGRENS SYNDROME-B EXTRACTABLE NUCLEAR ANTIBODY: SSB (La) (ENA) Antibody, IgG: <1 AI

## 2019-08-01 LAB — ANTI-DNA ANTIBODY, DOUBLE-STRANDED: ds DNA Ab: 1 IU/mL

## 2019-08-01 LAB — SJOGRENS SYNDROME-A EXTRACTABLE NUCLEAR ANTIBODY: SSA (Ro) (ENA) Antibody, IgG: <1 AI

## 2019-08-19 ENCOUNTER — Emergency Department (HOSPITAL_COMMUNITY)
Admission: EM | Admit: 2019-08-19 | Discharge: 2019-08-19 | Disposition: A | Discharge status: Home / Self Care | Payer: BC Managed Care – PPO | Attending: Emergency Medicine | Admitting: Emergency Medicine

## 2019-08-19 ENCOUNTER — Emergency Department (HOSPITAL_COMMUNITY): Payer: BC Managed Care – PPO

## 2019-08-19 ENCOUNTER — Other Ambulatory Visit: Payer: Self-pay

## 2019-08-19 ENCOUNTER — Encounter (HOSPITAL_COMMUNITY): Payer: Self-pay | Admitting: Emergency Medicine

## 2019-08-19 DIAGNOSIS — N1330 Unspecified hydronephrosis: Secondary | ICD-10-CM | POA: Diagnosis not present

## 2019-08-19 DIAGNOSIS — Z79899 Other long term (current) drug therapy: Secondary | ICD-10-CM | POA: Insufficient documentation

## 2019-08-19 DIAGNOSIS — N183 Chronic kidney disease, stage 3 unspecified: Secondary | ICD-10-CM | POA: Diagnosis not present

## 2019-08-19 DIAGNOSIS — D219 Benign neoplasm of connective and other soft tissue, unspecified: Secondary | ICD-10-CM | POA: Insufficient documentation

## 2019-08-19 DIAGNOSIS — R109 Unspecified abdominal pain: Secondary | ICD-10-CM | POA: Diagnosis present

## 2019-08-19 DIAGNOSIS — R1084 Generalized abdominal pain: Secondary | ICD-10-CM | POA: Insufficient documentation

## 2019-08-19 DIAGNOSIS — I10 Essential (primary) hypertension: Secondary | ICD-10-CM

## 2019-08-19 DIAGNOSIS — I129 Hypertensive chronic kidney disease with stage 1 through stage 4 chronic kidney disease, or unspecified chronic kidney disease: Secondary | ICD-10-CM | POA: Diagnosis not present

## 2019-08-19 LAB — CBC WITH DIFFERENTIAL/PLATELET
Abs Immature Granulocytes: 0.02 10*3/uL (ref 0.00–0.07)
Basophils Absolute: 0 10*3/uL (ref 0.0–0.1)
Basophils Relative: 0 %
Eosinophils Absolute: 0 10*3/uL (ref 0.0–0.5)
Eosinophils Relative: 0 %
HCT: 40.5 % (ref 36.0–46.0)
Hemoglobin: 12.5 g/dL (ref 12.0–15.0)
Immature Granulocytes: 0 %
Lymphocytes Relative: 17 %
Lymphs Abs: 1.1 10*3/uL (ref 0.7–4.0)
MCH: 28.7 pg (ref 26.0–34.0)
MCHC: 30.9 g/dL (ref 30.0–36.0)
MCV: 93.1 fL (ref 80.0–100.0)
Monocytes Absolute: 0.5 10*3/uL (ref 0.1–1.0)
Monocytes Relative: 7 %
Neutro Abs: 5.1 10*3/uL (ref 1.7–7.7)
Neutrophils Relative %: 76 %
Platelets: 300 10*3/uL (ref 150–400)
RBC: 4.35 MIL/uL (ref 3.87–5.11)
RDW: 14.6 % (ref 11.5–15.5)
WBC: 6.7 10*3/uL (ref 4.0–10.5)
nRBC: 0 % (ref 0.0–0.2)

## 2019-08-19 LAB — URINALYSIS, ROUTINE W REFLEX MICROSCOPIC
Bilirubin Urine: NEGATIVE
Glucose, UA: NEGATIVE mg/dL
Hgb urine dipstick: NEGATIVE
Ketones, ur: NEGATIVE mg/dL
Leukocytes,Ua: NEGATIVE
Nitrite: NEGATIVE
Protein, ur: 30 mg/dL — AB
Specific Gravity, Urine: 1.018 (ref 1.005–1.030)
pH: 5 (ref 5.0–8.0)

## 2019-08-19 LAB — COMPREHENSIVE METABOLIC PANEL
ALT: 18 U/L (ref 0–44)
AST: 27 U/L (ref 15–41)
Albumin: 4.4 g/dL (ref 3.5–5.0)
Alkaline Phosphatase: 45 U/L (ref 38–126)
Anion gap: 10 (ref 5–15)
BUN: 25 mg/dL — ABNORMAL HIGH (ref 6–20)
CO2: 26 mmol/L (ref 22–32)
Calcium: 9.4 mg/dL (ref 8.9–10.3)
Chloride: 101 mmol/L (ref 98–111)
Creatinine, Ser: 1.35 mg/dL — ABNORMAL HIGH (ref 0.44–1.00)
GFR calc Af Amer: 56 mL/min — ABNORMAL LOW (ref >60)
GFR calc non Af Amer: 48 mL/min — ABNORMAL LOW (ref >60)
Glucose, Bld: 84 mg/dL (ref 70–99)
Potassium: 3.6 mmol/L (ref 3.5–5.1)
Sodium: 137 mmol/L (ref 135–145)
Total Bilirubin: 0.6 mg/dL (ref 0.3–1.2)
Total Protein: 8.3 g/dL — ABNORMAL HIGH (ref 6.5–8.1)

## 2019-08-19 LAB — WET PREP, GENITAL
Clue Cells Wet Prep HPF POC: NONE SEEN
Sperm: NONE SEEN
Trich, Wet Prep: NONE SEEN
Yeast Wet Prep HPF POC: NONE SEEN

## 2019-08-19 LAB — I-STAT BETA HCG BLOOD, ED (MC, WL, AP ONLY): I-stat hCG, quantitative: <5 m[IU]/mL (ref <5)

## 2019-08-19 LAB — LIPASE, BLOOD: Lipase: 35 U/L (ref 11–51)

## 2019-08-19 MED ORDER — IOHEXOL 300 MG/ML  SOLN
100.0000 mL | Freq: Once | INTRAMUSCULAR | Status: AC | PRN
  Administered 2019-08-19: 12:00:00 100 mL via INTRAVENOUS

## 2019-08-19 MED ORDER — HYDROMORPHONE HCL 1 MG/ML IJ SOLN
0.5000 mg | Freq: Once | INTRAMUSCULAR | Status: AC
  Administered 2019-08-19: 15:00:00 0.5 mg via INTRAVENOUS
  Filled 2019-08-19: qty 1

## 2019-08-19 MED ORDER — SODIUM CHLORIDE 0.9 % IV BOLUS
1000.0000 mL | Freq: Once | INTRAVENOUS | Status: AC
  Administered 2019-08-19: 12:00:00 1000 mL via INTRAVENOUS

## 2019-08-19 MED ORDER — SODIUM CHLORIDE (PF) 0.9 % IJ SOLN
INTRAMUSCULAR | Status: AC
  Filled 2019-08-19: qty 50

## 2019-08-19 MED ORDER — HYDROMORPHONE HCL 1 MG/ML IJ SOLN
0.2500 mg | Freq: Once | INTRAMUSCULAR | Status: AC
  Administered 2019-08-19: 12:00:00 0.25 mg via INTRAVENOUS
  Filled 2019-08-19: qty 1

## 2019-08-19 NOTE — ED Provider Notes (Signed)
[facility] DEPT Provider Note   CSN: [id number] Arrival date & time: 08/19/19  [id number]     History Chief Complaint  Patient presents with  . Abdominal Pain    Shelby Campbell is a 44 y.o. female history of CKD, HTN, BTL, who come in today with intermittent RLQ abdominal pain radiating into her back.  She reports the pain is 7/10.  She has tried tylenol with out relief.  Pain is worse with sitting up and leaning foreward.  She is able to tolerate PO intake.  No fever, N/D.  She reports mild nausea and slight headache.  Increased urinary frequency and pressure when urinating.  No dysuria.   Pain started 5 days ago, initially improved however then worsened today causing her to return.  HPI     Past Medical History:  Diagnosis Date  . Asthma   . Chronic kidney disease    sees Dr Erling Cruz- stage III per note   . Headache    Migraines  . Hiatal hernia   . Hypertension   . Mitral valve prolapse   . 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 Shortness of breath    related to asthma     Patient Active Problem List   Diagnosis Date Noted  . Chronic renal disease, stage III 04/11/2018  . Asthma 03/01/2018  . Benign hypertension with chronic kidney disease 02/26/2018  . Chronic gastritis, mild.  H Pylori negative 07/31/2013  . Chronic cholecystitis with calculus 07/31/2013     Past Surgical History:  Procedure Laterality Date  . AXILLARY LYMPH NODE BIOPSY  2010   right   . CHOLECYSTECTOMY    . DILITATION & CURRETTAGE/HYSTROSCOPY WITH NOVASURE ABLATION N/A 05/04/2016   Procedure: DILATATION & CURETTAGE/HYSTEROSCOPY WITH NOVASURE ABLATION;  Surgeon: Servando Salina, MD;  Location: [facility];  Service: Gynecology;  Laterality: N/A;  . LAPAROSCOPIC CHOLECYSTECTOMY SINGLE PORT N/A 09/10/2013   Procedure: LAPAROSCOPIC CHOLECYSTECTOMY SINGLE SITE WITH CHOLANGIOGRAM;  Surgeon: Adin Hector, MD;  Location: [facility];  Service: General;  Laterality: N/A;  .  TUBAL LIGATION       OB History   No obstetric history on file.     Family History  Problem Relation Age of Onset  . Hypertension Mother   . Arthritis Mother   . Hypertension Father   . Prostate cancer Father   . Arthritis Father   . Gout Father   . Healthy Son   . Healthy Daughter     Social History   Tobacco Use  . Smoking status: Never Smoker  . Smokeless tobacco: Never Used  Substance Use Topics  . Alcohol use: Yes    Comment: occasional  . Drug use: No    Home Medications Prior to Admission medications   Medication Sig Start Date End Date Taking? Authorizing Provider  albuterol (VENTOLIN HFA) 108 (90 Base) MCG/ACT inhaler Inhale 2 puffs into the lungs every 6 (six) hours as needed for wheezing or shortness of breath. 06/25/19  Yes [name], MD  Cholecalciferol (VITAMIN D) 125 MCG (5000 UT) CAPS Take 1,000 Units by mouth daily.    Yes [provider]  levocetirizine (XYZAL) 5 MG tablet Take 1 tablet (5 mg total) by mouth every evening. 06/25/19  Yes [name], MD  lisinopril-hydrochlorothiazide (ZESTORETIC) 20-12.5 MG tablet Take 1 tablet by mouth every evening. Patient taking differently: Take 1 tablet by mouth daily.  06/25/19  Yes [name], MD  metoprolol succinate (TOPROL-XL)  25 MG 24 hr tablet Take 1 tablet (25 mg total) by mouth daily. Patient taking differently: Take 25 mg by mouth at bedtime.  05/23/18  Yes [name], MD  mometasone-formoterol [facility]) 200-5 MCG/ACT AERO Inhale 2 puffs into the lungs 2 (two) times daily. 06/25/19  Yes [name], MD  Multiple Vitamin (MULTIVITAMIN) tablet Take 1 tablet by mouth daily.   Yes [provider]    Allergies    Other and Sulfa antibiotics  Review of Systems   Review of Systems  Constitutional: Negative for chills and fever.  Respiratory: Negative for shortness of breath.   Cardiovascular: Negative for chest pain.  Gastrointestinal: Positive for abdominal pain and  nausea. Negative for vomiting.  Genitourinary: Positive for frequency. Negative for dysuria, pelvic pain and urgency.  Musculoskeletal: Negative for neck pain.  Neurological: Positive for headaches.  Psychiatric/Behavioral: Negative for behavioral problems.  All other systems reviewed and are negative.   Physical Exam Updated Vital Signs BP (!) 183/98 (BP Location: Right Arm)   Pulse 68   Temp 98.1 F (36.7 C) (Oral)   Resp 18   Ht 5\' 5"  (1.651 m)   Wt 59 kg   SpO2 100%   BMI 21.63 kg/m   Physical Exam Vitals and nursing note reviewed. Exam conducted with a chaperone present (Female ED tech).  Constitutional:      General: She is not in acute distress.    Appearance: She is well-developed. She is not diaphoretic.  HENT:     Head: Normocephalic and atraumatic.  Eyes:     General: No scleral icterus.       Right eye: No discharge.        Left eye: No discharge.     Conjunctiva/sclera: Conjunctivae normal.  Cardiovascular:     Rate and Rhythm: Normal rate and regular rhythm.  Pulmonary:     Effort: Pulmonary effort is normal. No respiratory distress.     Breath sounds: No stridor.  Abdominal:     General: Abdomen is flat. There is no distension.     Palpations: Abdomen is soft.     Tenderness: There is abdominal tenderness in the right lower quadrant. There is right CVA tenderness. There is no left CVA tenderness. Positive signs include McBurney's sign.  Genitourinary:    Vagina: No foreign body. Vaginal discharge present.     Cervix: Normal.     Uterus: Normal.      Adnexa: Right adnexa normal and left adnexa normal.  Musculoskeletal:        General: No deformity.     Cervical back: Normal range of motion.  Skin:    General: Skin is warm and dry.  Neurological:     General: No focal deficit present.     Mental Status: She is alert.     Motor: No abnormal muscle tone.  Psychiatric:        Behavior: Behavior normal.     ED Results / Procedures / Treatments     Labs (all labs ordered are listed, but only abnormal results are displayed) Labs Reviewed  WET PREP, GENITAL - Abnormal; Notable for the following components:      Result Value   WBC, Wet Prep HPF POC MANY (*)    All other components within normal limits  COMPREHENSIVE METABOLIC PANEL - Abnormal; Notable for the following components:   BUN 25 (*)    Creatinine, Ser 1.35 (*)    Total Protein 8.3 (*)    GFR calc  non Af Amer 48 (*)    GFR calc Af Amer 56 (*)    All other components within normal limits  URINALYSIS, ROUTINE W REFLEX MICROSCOPIC - Abnormal; Notable for the following components:   Protein, ur 30 (*)    Bacteria, UA RARE (*)    All other components within normal limits  LIPASE, BLOOD  CBC WITH DIFFERENTIAL/PLATELET  I-STAT BETA HCG BLOOD, ED ([facility], [facility], [facility] ONLY)  GC/CHLAMYDIA PROBE AMP ([employer]) NOT AT [facility]    EKG None  Radiology US Transvaginal Non-OB  Result Date: 08/19/2019 CLINICAL DATA:  RIGHT lower quadrant pain since Thursday at ablation 3 years ago, RIGHT hydronephrosis on CT with poor visualization of uterus and RIGHT ovary EXAM: TRANSABDOMINAL AND TRANSVAGINAL ULTRASOUND OF PELVIS DOPPLER ULTRASOUND OF OVARIES TECHNIQUE: Both transabdominal and transvaginal ultrasound examinations of the pelvis were performed. Transabdominal technique was performed for global imaging of the pelvis including uterus, ovaries, adnexal regions, and pelvic cul-de-sac. It was necessary to proceed with endovaginal exam following the transabdominal exam to visualize the uterus, endometrium, and ovaries. Color and duplex Doppler ultrasound was utilized to evaluate blood flow to the ovaries. COMPARISON:  CT abdomen and pelvis 08/19/2019 FINDINGS: Uterus Measurements: 7.6 x 4.7 x 4.9 cm = volume: 91 mL. Retroverted. Heterogeneous myometrium. Tiny cluster of calcifications at anterior fundus. Tiny probable leiomyoma mid uterus 5 mm diameter. No additional masses. Endometrium Thickness: 8 mm.   No endometrial fluid or focal abnormality Right ovary Measurements: 3.0 x 2.0 x 2.6 cm = volume: 8.2 mL. Dominant follicle without mass. Blood flow present within RIGHT ovary on color Doppler imaging. Left ovary Measurements: 6.8 x 1.8 x 2.2 cm = volume: 14 mL. Small hemorrhagic corpus luteum. No additional masses. Blood flow present within LEFT ovary on color Doppler imaging. Pulsed Doppler evaluation of both ovaries demonstrates normal low-resistance arterial and venous waveforms. Other findings Trace free pelvic fluid.  No adnexal masses. IMPRESSION: Retroverted uterus with a tiny 5 mm probable leiomyoma. Normal appearing endometrial complex. Dominant follicle RIGHT ovary and small hemorrhagic corpus luteum LEFT ovary. No evidence of ovarian/adnexal mass or ovarian torsion. Electronically Signed   By: Lavonia Dana M.D.   On: 08/19/2019 16:30   US Pelvis Complete  Result Date: 08/19/2019 CLINICAL DATA:  RIGHT lower quadrant pain since Thursday at ablation 3 years ago, RIGHT hydronephrosis on CT with poor visualization of uterus and RIGHT ovary EXAM: TRANSABDOMINAL AND TRANSVAGINAL ULTRASOUND OF PELVIS DOPPLER ULTRASOUND OF OVARIES TECHNIQUE: Both transabdominal and transvaginal ultrasound examinations of the pelvis were performed. Transabdominal technique was performed for global imaging of the pelvis including uterus, ovaries, adnexal regions, and pelvic cul-de-sac. It was necessary to proceed with endovaginal exam following the transabdominal exam to visualize the uterus, endometrium, and ovaries. Color and duplex Doppler ultrasound was utilized to evaluate blood flow to the ovaries. COMPARISON:  CT abdomen and pelvis 08/19/2019 FINDINGS: Uterus Measurements: 7.6 x 4.7 x 4.9 cm = volume: 91 mL. Retroverted. Heterogeneous myometrium. Tiny cluster of calcifications at anterior fundus. Tiny probable leiomyoma mid uterus 5 mm diameter. No additional masses. Endometrium Thickness: 8 mm.  No endometrial fluid or  focal abnormality Right ovary Measurements: 3.0 x 2.0 x 2.6 cm = volume: 8.2 mL. Dominant follicle without mass. Blood flow present within RIGHT ovary on color Doppler imaging. Left ovary Measurements: 6.8 x 1.8 x 2.2 cm = volume: 14 mL. Small hemorrhagic corpus luteum. No additional masses. Blood flow present within LEFT ovary on color Doppler imaging. Pulsed Doppler evaluation  of both ovaries demonstrates normal low-resistance arterial and venous waveforms. Other findings Trace free pelvic fluid.  No adnexal masses. IMPRESSION: Retroverted uterus with a tiny 5 mm probable leiomyoma. Normal appearing endometrial complex. Dominant follicle RIGHT ovary and small hemorrhagic corpus luteum LEFT ovary. No evidence of ovarian/adnexal mass or ovarian torsion. Electronically Signed   By: Lavonia Dana M.D.   On: 08/19/2019 16:30   CT Abdomen Pelvis W Contrast  Result Date: 08/19/2019 CLINICAL DATA:  Right lower quadrant pain EXAM: CT ABDOMEN AND PELVIS WITH CONTRAST TECHNIQUE: Multidetector CT imaging of the abdomen and pelvis was performed using the standard protocol following bolus administration of intravenous contrast. CONTRAST:  137mL OMNIPAQUE IOHEXOL 300 MG/ML  SOLN COMPARISON:  None. FINDINGS: Lower chest: No acute abnormality. Hepatobiliary: No focal liver abnormality is seen. Status post cholecystectomy. No unexpected biliary dilatation. Pancreas: Unremarkable. Spleen: Unremarkable. Adrenals/Urinary Tract: Adrenals are unremarkable. Left kidney is normal appearance. There is right hydronephrosis. Right hydroureter is seen to the level of the right adnexal region. No obstructing calculus identified. Bladder is unremarkable. Stomach/Bowel: Stomach is within normal limits. Bowel is normal in caliber. Normal appendix. Vascular/Lymphatic: No significant vascular findings are present. No enlarged abdominal or pelvic lymph nodes. Reproductive: Heterogeneous enhancement of the uterus. Probable left ovary identified  with subcentimeter follicle. Right ovary not well seen. Other: Abdominal wall is unremarkable.  No ascites. Musculoskeletal: No acute or significant osseous findings. IMPRESSION: Moderate right hydronephrosis. There is right hydroureter seen to the level of the adnexal region. An obstructing calculus identified but mass is not excluded. Alternatively, may be secondary to a pelvic process noting heterogeneous appearance of the uterus and poor visualization of the right ovary. These results and recommendations were called by telephone at the time of interpretation on 08/19/2019 at 1:13 pm to provider [facility] , who verbally acknowledged these results. Electronically Signed   By: Macy Mis M.D.   On: 08/19/2019 13:14   [place] Art/Ven Flow Abd Pelv Doppler  Result Date: 08/19/2019 CLINICAL DATA:  RIGHT lower quadrant pain since Thursday at ablation 3 years ago, RIGHT hydronephrosis on CT with poor visualization of uterus and RIGHT ovary EXAM: TRANSABDOMINAL AND TRANSVAGINAL ULTRASOUND OF PELVIS DOPPLER ULTRASOUND OF OVARIES TECHNIQUE: Both transabdominal and transvaginal ultrasound examinations of the pelvis were performed. Transabdominal technique was performed for global imaging of the pelvis including uterus, ovaries, adnexal regions, and pelvic cul-de-sac. It was necessary to proceed with endovaginal exam following the transabdominal exam to visualize the uterus, endometrium, and ovaries. Color and duplex Doppler ultrasound was utilized to evaluate blood flow to the ovaries. COMPARISON:  CT abdomen and pelvis 08/19/2019 FINDINGS: Uterus Measurements: 7.6 x 4.7 x 4.9 cm = volume: 91 mL. Retroverted. Heterogeneous myometrium. Tiny cluster of calcifications at anterior fundus. Tiny probable leiomyoma mid uterus 5 mm diameter. No additional masses. Endometrium Thickness: 8 mm.  No endometrial fluid or focal abnormality Right ovary Measurements: 3.0 x 2.0 x 2.6 cm = volume: 8.2 mL. Dominant follicle without  mass. Blood flow present within RIGHT ovary on color Doppler imaging. Left ovary Measurements: 6.8 x 1.8 x 2.2 cm = volume: 14 mL. Small hemorrhagic corpus luteum. No additional masses. Blood flow present within LEFT ovary on color Doppler imaging. Pulsed Doppler evaluation of both ovaries demonstrates normal low-resistance arterial and venous waveforms. Other findings Trace free pelvic fluid.  No adnexal masses. IMPRESSION: Retroverted uterus with a tiny 5 mm probable leiomyoma. Normal appearing endometrial complex. Dominant follicle RIGHT ovary and small hemorrhagic corpus luteum LEFT  ovary. No evidence of ovarian/adnexal mass or ovarian torsion. Electronically Signed   By: Lavonia Dana M.D.   On: 08/19/2019 16:30    Procedures Procedures (including critical care time)  Medications Ordered in ED Medications  HYDROmorphone (DILAUDID) injection 0.25 mg (0.25 mg Intravenous Given 08/19/19 1210)  sodium chloride 0.9 % bolus 1,000 mL (0 mLs Intravenous Stopped 08/19/19 1718)  iohexol (OMNIPAQUE) 300 MG/ML solution 100 mL (100 mLs Intravenous Contrast Given 08/19/19 1224)  HYDROmorphone (DILAUDID) injection 0.5 mg (0.5 mg Intravenous Given 08/19/19 1459)    ED Course  I have reviewed the triage vital signs and the nursing notes.  Pertinent labs & imaging results that were available during my care of the patient were reviewed by me and considered in my medical decision making (see chart for details).  Clinical Course as of Aug 19 2226  Mon Aug 19, 2019  1320 I spoke with radiologist who recommended trying to get a delayed phase imaging due to hydroureter however it has been an hour and CT tech reports that with out giving additional contrast bolus unable to reliably get contrast in kidneys/ureter.    [EH]  1639 Spoke with Urology who recommends outpatient follow up.    [EH]    Clinical Course User Index [EH] Ollen Gross   MDM Rules/Calculators/A&P                     Patient is a  44 year old woman who presents today for evaluation of right lower quadrant abdominal pain.  She has right-sided CVA tenderness to percussion along with tenderness over McBurney's point.  Obtained and reviewed, she does not have significant acute hematologic or electrolyte derangements on CBC, and CMP.  She does have CKD with her creatinine consistent with her normal baseline.  Urine has rare bacteria and 30 protein however no clear evidence of infection.  CT abdomen pelvis was obtained showing concern for right-sided hydroureteronephrosis without clear obstructions cause in addition to concern for pelvic abnormalities. Unable to get delayed phase imaging due to the delay between CT scan being obtained and request from radiologist. Pelvic ultrasound was obtained without evidence of torsion, significant ovarian mass or uterine mass.  Wet prep has many white blood cells.  Patient symptoms were treated in the emergency room with IV Dilaudid.  I offered her prescription for narcotic pain medicine at home which she refused. I spoke with Dr. Claudia Desanctis of urology who recommends close outpatient follow-up.\  Recommended patient follow-up with her OB/GYN, and she states her understanding.  Return precautions were discussed with patient who states their understanding.  At the time of discharge patient denied any unaddressed complaints or concerns.  Patient is agreeable for discharge home.  Note: Portions of this report may have been transcribed using voice recognition software. Every effort was made to ensure accuracy; however, inadvertent computerized transcription errors may be present  Final Clinical Impression(s) / ED Diagnoses Final diagnoses:  Generalized abdominal pain  Leiomyoma  Hydroureteronephrosis  Hypertension, unspecified type    Rx / DC Orders ED Discharge Orders    None       Ollen Gross 08/19/19 2231    Wyvonnia Dusky, MD 08/20/19 2035

## 2019-08-19 NOTE — ED Triage Notes (Signed)
Pt c/o RLQ pains that has been intermittent since Thursday. Denies vomiting, urinary, or bowel problems. Reports intermittent nausea.

## 2019-08-19 NOTE — Discharge Instructions (Signed)
Please take Ibuprofen (Advil, motrin) and Tylenol (acetaminophen) to relieve your pain.  You may take up to 600 MG (3 pills) of normal strength ibuprofen every 8 hours as needed.  In between doses of ibuprofen you make take tylenol, up to 1,000 mg (two extra strength pills).  Do not take more than 3,000 mg tylenol in a 24 hour period.  Please check all medication labels as many medications such as pain and cold medications may contain tylenol.  Do not drink alcohol while taking these medications.  Do not take other NSAID'S while taking ibuprofen (such as aleve or naproxen).  Please take ibuprofen with food to decrease stomach upset.   Today you received medications that may make you sleepy or impair your ability to make decisions.  For the next 24 hours please do not drive, operate heavy machinery, care for a small child with out another adult present, or perform any activities that may cause harm to you or someone else if you were to fall asleep or be impaired.   As we discussed today your blood pressure was elevated.  This is most likely due to you not taking any blood pressure medicines at home. It is important that you take your blood pressure medication every day as untreated high blood pressure can cause serious, long term, harm.

## 2019-08-19 NOTE — ED Notes (Signed)
Ultrasound at pt bedside 

## 2019-08-20 LAB — GC/CHLAMYDIA PROBE AMP (~~LOC~~) NOT AT ARMC
Chlamydia: NEGATIVE
Comment: NEGATIVE
Comment: NORMAL
Neisseria Gonorrhea: NEGATIVE

## 2019-10-01 ENCOUNTER — Other Ambulatory Visit: Payer: Self-pay | Admitting: Urology

## 2019-10-09 ENCOUNTER — Encounter (HOSPITAL_BASED_OUTPATIENT_CLINIC_OR_DEPARTMENT_OTHER): Payer: Self-pay | Admitting: Urology

## 2019-10-09 ENCOUNTER — Other Ambulatory Visit: Payer: Self-pay

## 2019-10-09 NOTE — Progress Notes (Addendum)
Spoke w/ via phone for pre-op interview---patient Lab needs dos----    I stat 8 , urine poct         Lab results------[id number] epic COVID test ------10-11-2019 at [id number] Arrive at -------830 am 10-15-2019 NPO after ------midnight Medications to take morning of surgery --albuterol inhaler prn/bring inhaler, dulera inhaler-- Diabetic medication -----n/a Patient Special Instructions -----none Pre-Op special Istructions -----none Patient verbalized understanding of instructions that were given at this phone interview. Patient denies shortness of breath, chest pain, fever, cough a this phone interview.  lov dr Cecille Rubin [facility] on chart 10-01-2019 renal function panel done 10-01-2019 on chart too old to use .

## 2019-10-11 ENCOUNTER — Other Ambulatory Visit (HOSPITAL_COMMUNITY)
Admission: RE | Admit: 2019-10-11 | Discharge: 2019-10-11 | Disposition: A | Discharge status: Home / Self Care | Payer: BC Managed Care – PPO | Source: Ambulatory Visit | Attending: Urology | Admitting: Urology

## 2019-10-11 DIAGNOSIS — Z20822 Contact with and (suspected) exposure to covid-19: Secondary | ICD-10-CM | POA: Diagnosis not present

## 2019-10-11 DIAGNOSIS — Z01812 Encounter for preprocedural laboratory examination: Secondary | ICD-10-CM | POA: Insufficient documentation

## 2019-10-12 LAB — SARS CORONAVIRUS 2 (TAT 6-24 HRS): SARS Coronavirus 2: NEGATIVE

## 2019-10-14 NOTE — H&P (Signed)
CC/HPI: cc: right hydronephrosis   09/16/19: 44 year old woman with a history of porphyria and CKD who presents the ER in April 2021 with right-sided lower quadrant pain patient reports intermittent pelvic and right lower quadrant pain for the past year. CT scan obtained showed right hydronephrosis without clear calculus seen. Delayed images were not obtained down to the bladder. She also reports urinary frequency but is on a diuretic and is unsure if it is related to this. 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 Patient denies gross hematuria or UTIs. She has never had a kidney stone.     ALLERGIES: Sulfur     MEDICATIONS: Metoprolol Succinate 25 mg tablet, extended release 24 hr  Lisinopril-Hydrochlorothiazide 20 mg-12.5 mg tablet  Multivitamin  Tylenol  Vitamin D  Xyzal 5 mg tablet     GU PSH: None     PSH Notes: axillart lymph node biopsy, hystroscopy with novasure ablation   NON-GU PSH: Bilateral Tubal Ligation Remove Gallbladder     GU PMH: Chronic Kidney Disease    NON-GU PMH: Asthma Unspecified porphyria    FAMILY HISTORY: Arthritis - Mother Hypertension - Father, Mother Prostate Cancer - Father   SOCIAL HISTORY: Marital Status: Married Preferred Language: English; Ethnicity: Not Hispanic Or Latino; Race: Black or African American Current Smoking Status: Patient has never smoked.   Tobacco Use Assessment Completed: Used Tobacco in last 30 days? Social Drinker.  Drinks 1 caffeinated drink per day.    REVIEW OF SYSTEMS:    GU Review Female:   Patient reports frequent urination and get up at night to urinate. Patient denies hard to postpone urination, burning /pain with urination, leakage of urine, stream starts and stops, trouble starting your stream, have to strain to urinate, and being pregnant.  Gastrointestinal (Upper):   Patient denies nausea, vomiting, and indigestion/ heartburn.  Gastrointestinal (Lower):   Patient denies diarrhea  and constipation.  Constitutional:   Patient denies fever, night sweats, weight loss, and fatigue.  Skin:   Patient denies skin rash/ lesion and itching.  Eyes:   Patient denies blurred vision and double vision.  Ears/ Nose/ Throat:   Patient denies sore throat and sinus problems.  Hematologic/Lymphatic:   Patient denies swollen glands and easy bruising.  Cardiovascular:   Patient reports chest pains. Patient denies leg swelling.  Respiratory:   Patient reports cough. Patient denies shortness of breath.  Endocrine:   Patient denies excessive thirst.  Musculoskeletal:   Patient reports back pain and joint pain.   Neurological:   Patient reports headaches. Patient denies dizziness.  Psychologic:   Patient denies depression and anxiety.   VITAL SIGNS:      09/16/2019 01:11 PM  Weight 135 lb / 61.23 kg  Height 65 in / 165.1 cm  BP 135/86 mmHg  Pulse 79 /min  BMI 22.5 kg/m   MULTI-SYSTEM PHYSICAL EXAMINATION:    Constitutional: Well-nourished. No physical deformities. Normally developed. Good grooming.  Neck: Neck symmetrical, not swollen. Normal tracheal position.  Respiratory: No labored breathing, no use of accessory muscles.   Cardiovascular: Normal temperature  Skin: No paleness, no jaundice, no cyanosis. No lesion, no ulcer, no rash.  Neurologic / Psychiatric: Oriented to time, oriented to place, oriented to person. No depression, no anxiety, no agitation.  Gastrointestinal: No mass, no tenderness, no rigidity, non obese abdomen.   Eyes: Normal conjunctivae. Normal eyelids.  Ears, Nose, Mouth, and Throat: Left ear no scars, no lesions, no masses. Right ear no scars, no lesions,  no masses. Nose no scars, no lesions, no masses. Normal hearing. Normal lips.  Musculoskeletal: Normal gait and station of head and neck.     Complexity of Data:  Records Review:   Previous Hospital Records, POC Tool  Urine Test Review:   Urinalysis  X-Ray Review: C.T. Abdomen/Pelvis: Reviewed Films.  Reviewed Report. Discussed With Patient.    Notes:                     08/19/2019: BUN 25, creatinine 1.35, GFR 48   PROCEDURES:         PVR Ultrasound - 18335  Scanned Volume: 000 cc         Urinalysis w/Scope Micro  WBC/hpf: 0 - 5/hpf  RBC/hpf: 0 - 2/hpf  Bacteria: Few (10-25/hpf)  Cystals: NS (Not Seen)  Casts: NS (Not Seen)  Trichomonas: Not Present  Mucous: Present  Epithelial Cells: 0 - 5/hpf  Yeast: NS (Not Seen)  Sperm: Not Present    ASSESSMENT:      ICD-10 Details  1 GU:   Hydronephrosis - N13.0 Right, Undiagnosed New Problem - Unclear if right hydronephrosis is coming from internal obstruction or external compression. Will proceed with CT urogram to further assess the entire ureter. We did discuss proceeding with a diagnostic ureteroscopy it is unclear after the CT scan.

## 2019-10-15 ENCOUNTER — Ambulatory Visit (HOSPITAL_BASED_OUTPATIENT_CLINIC_OR_DEPARTMENT_OTHER): Payer: BC Managed Care – PPO | Admitting: Anesthesiology

## 2019-10-15 ENCOUNTER — Encounter (HOSPITAL_BASED_OUTPATIENT_CLINIC_OR_DEPARTMENT_OTHER): Payer: Self-pay | Admitting: Urology

## 2019-10-15 ENCOUNTER — Other Ambulatory Visit: Payer: Self-pay

## 2019-10-15 ENCOUNTER — Ambulatory Visit (HOSPITAL_BASED_OUTPATIENT_CLINIC_OR_DEPARTMENT_OTHER)
Admission: RE | Admit: 2019-10-15 | Discharge: 2019-10-15 | Disposition: A | Discharge status: Home / Self Care | Payer: BC Managed Care – PPO | Attending: Urology | Admitting: Urology

## 2019-10-15 ENCOUNTER — Encounter (HOSPITAL_BASED_OUTPATIENT_CLINIC_OR_DEPARTMENT_OTHER)
Admission: RE | Disposition: A | Discharge status: Home / Self Care | Payer: Self-pay | Source: Home / Self Care | Attending: Urology

## 2019-10-15 DIAGNOSIS — I129 Hypertensive chronic kidney disease with stage 1 through stage 4 chronic kidney disease, or unspecified chronic kidney disease: Secondary | ICD-10-CM | POA: Insufficient documentation

## 2019-10-15 DIAGNOSIS — J45909 Unspecified asthma, uncomplicated: Secondary | ICD-10-CM | POA: Insufficient documentation

## 2019-10-15 DIAGNOSIS — Z79899 Other long term (current) drug therapy: Secondary | ICD-10-CM | POA: Insufficient documentation

## 2019-10-15 DIAGNOSIS — N1330 Unspecified hydronephrosis: Secondary | ICD-10-CM | POA: Diagnosis not present

## 2019-10-15 DIAGNOSIS — N189 Chronic kidney disease, unspecified: Secondary | ICD-10-CM | POA: Diagnosis not present

## 2019-10-15 DIAGNOSIS — Z8249 Family history of ischemic heart disease and other diseases of the circulatory system: Secondary | ICD-10-CM | POA: Diagnosis not present

## 2019-10-15 HISTORY — DX: Personal history of other diseases of the circulatory system: Z86.79

## 2019-10-15 HISTORY — PX: CYSTOSCOPY WITH RETROGRADE PYELOGRAM, URETEROSCOPY AND STENT PLACEMENT: SHX5789

## 2019-10-15 LAB — POCT I-STAT, CHEM 8
BUN: 20 mg/dL (ref 6–20)
Calcium, Ion: 1.29 mmol/L (ref 1.15–1.40)
Chloride: 102 mmol/L (ref 98–111)
Creatinine, Ser: 1.6 mg/dL — ABNORMAL HIGH (ref 0.44–1.00)
Glucose, Bld: 87 mg/dL (ref 70–99)
HCT: 36 % (ref 36.0–46.0)
Hemoglobin: 12.2 g/dL (ref 12.0–15.0)
Potassium: 4.1 mmol/L (ref 3.5–5.1)
Sodium: 139 mmol/L (ref 135–145)
TCO2: 27 mmol/L (ref 22–32)

## 2019-10-15 LAB — POCT PREGNANCY, URINE: Preg Test, Ur: NEGATIVE

## 2019-10-15 SURGERY — CYSTOURETEROSCOPY, WITH RETROGRADE PYELOGRAM AND STENT INSERTION
Anesthesia: General | Laterality: Right

## 2019-10-15 MED ORDER — FENTANYL CITRATE (PF) 100 MCG/2ML IJ SOLN
INTRAMUSCULAR | Status: AC
  Filled 2019-10-15: qty 2

## 2019-10-15 MED ORDER — DEXAMETHASONE SODIUM PHOSPHATE 4 MG/ML IJ SOLN
INTRAMUSCULAR | Status: DC | PRN
  Administered 2019-10-15: 10 mg via INTRAVENOUS

## 2019-10-15 MED ORDER — IOHEXOL 300 MG/ML  SOLN
INTRAMUSCULAR | Status: DC | PRN
  Administered 2019-10-15: 17 mL via URETHRAL

## 2019-10-15 MED ORDER — PROPOFOL 10 MG/ML IV BOLUS
INTRAVENOUS | Status: DC | PRN
  Administered 2019-10-15: 50 mg via INTRAVENOUS
  Administered 2019-10-15: 150 mg via INTRAVENOUS

## 2019-10-15 MED ORDER — DEXAMETHASONE SODIUM PHOSPHATE 10 MG/ML IJ SOLN
INTRAMUSCULAR | Status: AC
  Filled 2019-10-15: qty 1

## 2019-10-15 MED ORDER — MIDAZOLAM HCL 5 MG/5ML IJ SOLN
INTRAMUSCULAR | Status: DC | PRN
  Administered 2019-10-15: 2 mg via INTRAVENOUS

## 2019-10-15 MED ORDER — TRAMADOL HCL 50 MG PO TABS: 50.0000 mg | ORAL_TABLET | Freq: Four times a day (QID) | ORAL | 0 refills | Status: DC | PRN

## 2019-10-15 MED ORDER — LIDOCAINE 2% (20 MG/ML) 5 ML SYRINGE
INTRAMUSCULAR | Status: AC
  Filled 2019-10-15: qty 5

## 2019-10-15 MED ORDER — PHENAZOPYRIDINE HCL 100 MG PO TABS
100.0000 mg | ORAL_TABLET | Freq: Three times a day (TID) | ORAL | 0 refills | Status: DC | PRN
Start: 2019-10-15 — End: 2019-10-30

## 2019-10-15 MED ORDER — LIDOCAINE HCL (CARDIAC) PF 100 MG/5ML IV SOSY
PREFILLED_SYRINGE | INTRAVENOUS | Status: DC | PRN
  Administered 2019-10-15: 80 mg via INTRAVENOUS

## 2019-10-15 MED ORDER — SODIUM CHLORIDE 0.9 % IV SOLN: INTRAVENOUS | Status: DC

## 2019-10-15 MED ORDER — OXYCODONE HCL 5 MG/5ML PO SOLN: 5.0000 mg | Freq: Once | ORAL | Status: DC | PRN

## 2019-10-15 MED ORDER — PROPOFOL 10 MG/ML IV BOLUS
INTRAVENOUS | Status: AC
  Filled 2019-10-15: qty 20

## 2019-10-15 MED ORDER — SODIUM CHLORIDE 0.9 % IV SOLN
INTRAVENOUS | Status: AC
  Filled 2019-10-15: qty 100

## 2019-10-15 MED ORDER — FENTANYL CITRATE (PF) 100 MCG/2ML IJ SOLN: 25.0000 ug | INTRAMUSCULAR | Status: DC | PRN

## 2019-10-15 MED ORDER — CEFTRIAXONE SODIUM 2 G IJ SOLR
INTRAMUSCULAR | Status: AC
  Filled 2019-10-15: qty 20

## 2019-10-15 MED ORDER — OXYBUTYNIN CHLORIDE 5 MG PO TABS: 5.0000 mg | ORAL_TABLET | Freq: Three times a day (TID) | ORAL | 0 refills | Status: DC | PRN

## 2019-10-15 MED ORDER — OXYCODONE HCL 5 MG PO TABS: 5.0000 mg | ORAL_TABLET | Freq: Once | ORAL | Status: DC | PRN

## 2019-10-15 MED ORDER — ONDANSETRON HCL 4 MG/2ML IJ SOLN
INTRAMUSCULAR | Status: DC | PRN
  Administered 2019-10-15: 4 mg via INTRAVENOUS

## 2019-10-15 MED ORDER — FENTANYL CITRATE (PF) 100 MCG/2ML IJ SOLN
INTRAMUSCULAR | Status: DC | PRN
  Administered 2019-10-15 (×6): 25 ug via INTRAVENOUS
  Administered 2019-10-15: 50 ug via INTRAVENOUS

## 2019-10-15 MED ORDER — SODIUM CHLORIDE 0.9 % IV SOLN
2.0000 g | INTRAVENOUS | Status: AC
  Administered 2019-10-15: 2 g via INTRAVENOUS

## 2019-10-15 MED ORDER — MIDAZOLAM HCL 2 MG/2ML IJ SOLN
INTRAMUSCULAR | Status: AC
  Filled 2019-10-15: qty 2

## 2019-10-15 MED ORDER — CIPROFLOXACIN HCL 500 MG PO TABS
500.0000 mg | ORAL_TABLET | Freq: Once | ORAL | 0 refills | Status: AC
Start: 2019-10-15 — End: 2019-10-15

## 2019-10-15 MED ORDER — ONDANSETRON HCL 4 MG/2ML IJ SOLN
INTRAMUSCULAR | Status: AC
  Filled 2019-10-15: qty 2

## 2019-10-15 SURGICAL SUPPLY — 20 items
BAG DRAIN URO-CYSTO SKYTR STRL (DRAIN) ×3 IMPLANT
BASKET ZERO TIP NITINOL 2.4FR (BASKET) IMPLANT
CATH URET 5FR 28IN OPEN ENDED (CATHETERS) ×3 IMPLANT
CLOTH BEACON ORANGE TIMEOUT ST (SAFETY) ×3 IMPLANT
DRSG TEGADERM 4X4.75 (GAUZE/BANDAGES/DRESSINGS) IMPLANT
EXTRACTOR STONE 1.7FRX115CM (UROLOGICAL SUPPLIES) IMPLANT
FIBER LASER TRAC TIP (UROLOGICAL SUPPLIES) ×3 IMPLANT
GLOVE BIO SURGEON STRL SZ 6.5 (GLOVE) ×2 IMPLANT
GLOVE BIO SURGEONS STRL SZ 6.5 (GLOVE) ×1
GOWN STRL REUS W/TWL LRG LVL3 (GOWN DISPOSABLE) ×3 IMPLANT
GUIDEWIRE STR DUAL SENSOR (WIRE) ×3 IMPLANT
IV NS IRRIG 3000ML ARTHROMATIC (IV SOLUTION) ×9 IMPLANT
KIT TURNOVER CYSTO (KITS) ×3 IMPLANT
MANIFOLD NEPTUNE II (INSTRUMENTS) ×3 IMPLANT
STENT URET 6FRX24 CONTOUR (STENTS) ×3 IMPLANT
STENT URET 6FRX26 CONTOUR (STENTS) ×3 IMPLANT
TRAY CYSTO PACK (CUSTOM PROCEDURE TRAY) ×3 IMPLANT
TUBE CONNECTING 12'X1/4 (SUCTIONS) ×1
TUBE CONNECTING 12X1/4 (SUCTIONS) ×2 IMPLANT
TUBING UROLOGY SET (TUBING) ×6 IMPLANT

## 2019-10-15 NOTE — Discharge Instructions (Signed)
  Post Anesthesia Home Care Instructions  Activity: Get plenty of rest for the remainder of the day. A responsible individual must stay with you for 24 hours following the procedure.  For the next 24 hours, DO NOT: -Drive a car -Paediatric nurse -Drink alcoholic beverages -Take any medication unless instructed by your physician -Make any legal decisions or sign important papers.  Meals: Start with liquid foods such as gelatin or soup. Progress to regular foods as tolerated. Avoid greasy, spicy, heavy foods. If nausea and/or vomiting occur, drink only clear liquids until the nausea and/or vomiting subsides. Call your physician if vomiting continues.  Special Instructions/Symptoms: Your throat may feel dry or sore from the anesthesia or the breathing tube placed in your throat during surgery. If this causes discomfort, gargle with warm salt water. The discomfort should disappear within 24 hours.  If you had a scopolamine patch placed behind your ear for the management of post- operative nausea and/or vomiting:  1. The medication in the patch is effective for 72 hours, after which it should be removed.  Wrap patch in a tissue and discard in the trash. Wash hands thoroughly with soap and water. 2. You may remove the patch earlier than 72 hours if you experience unpleasant side effects which may include dry mouth, dizziness or visual disturbances. 3. Avoid touching the patch. Wash your hands with soap and water after contact with the patch.    DISCHARGE INSTRUCTIONS FOR KIDNEY STONE/URETERAL STENT   MEDICATIONS:  1. Resume all your other meds from home - except do not take any extra narcotic pain meds that you may have at home.  2. Pyridium is to help with the burning/stinging when you urinate. 3. Tramadol is for moderate/severe pain, otherwise taking upto 1000 mg every 6 hours of plainTylenol will help treat your pain.   4. Take Cipro one hour prior to removal of your stent.  5. Oxybutynin  is to help with bladder cramping/pressure  ACTIVITY:  1. No strenuous activity x 1week  2. No driving while on narcotic pain medications  3. Drink plenty of water  4. Continue to walk at home - you can still get blood clots when you are at home, so keep active, but don't over do it.  5. May return to work/school tomorrow or when you feel ready   BATHING:  1. You can shower and we recommend daily showers  2. You have a string coming from your urethra: The stent string is attached to your ureteral stent. Do not pull on this.   SIGNS/SYMPTOMS TO CALL:  Please call us if you have a fever greater than 101.5, uncontrolled nausea/vomiting, uncontrolled pain, dizziness, unable to urinate, bloody urine, chest pain, shortness of breath, leg swelling, leg pain, redness around wound, drainage from wound, or any other concerns or questions.   You can reach [place] at [phone].   FOLLOW-UP:  1. You have a string attached to your stent, you may remove it on Monday, June 14. To do this, pull the strings until the stents are completely removed. You may feel an odd sensation in your back.

## 2019-10-15 NOTE — Anesthesia Preprocedure Evaluation (Signed)
Anesthesia Evaluation  Patient identified by MRN, date of birth, ID band Patient awake    Reviewed: Allergy & Precautions, NPO status , Patient's Chart, lab work & pertinent test results  Airway Mallampati: II  TM Distance: >3 FB Neck ROM: Full    Dental no notable dental hx.    Pulmonary asthma ,    Pulmonary exam normal breath sounds clear to auscultation       Cardiovascular hypertension, Pt. on medications and Pt. on home beta blockers Normal cardiovascular exam Rhythm:Regular Rate:Normal     Neuro/Psych negative neurological ROS  negative psych ROS   GI/Hepatic negative GI ROS, Neg liver ROS,   Endo/Other  negative endocrine ROS  Renal/GU Renal InsufficiencyRenal disease  negative genitourinary   Musculoskeletal negative musculoskeletal ROS (+)   Abdominal   Peds negative pediatric ROS (+)  Hematology negative hematology ROS (+)   Anesthesia Other Findings   Reproductive/Obstetrics negative OB ROS                             Anesthesia Physical Anesthesia Plan  ASA: III  Anesthesia Plan: General   Post-op Pain Management:    Induction: Intravenous  PONV Risk Score and Plan: 3 and Ondansetron, Dexamethasone, Treatment may vary due to age or medical condition and Midazolam  Airway Management Planned: LMA  Additional Equipment:   Intra-op Plan:   Post-operative Plan: Extubation in OR  Informed Consent: I have reviewed the patients History and Physical, chart, labs and discussed the procedure including the risks, benefits and alternatives for the proposed anesthesia with the patient or authorized representative who has indicated his/her understanding and acceptance.     Dental advisory given  Plan Discussed with: CRNA and Surgeon  Anesthesia Plan Comments:         Anesthesia Quick Evaluation

## 2019-10-15 NOTE — Anesthesia Postprocedure Evaluation (Signed)
Anesthesia Post Note  Patient: MESHIA RAU  Procedure(s) Performed: CYSTOSCOPY WITH RETROGRADE PYELOGRAM, URETEROSCOPY AND STENT PLACEMENT, (Right )     Patient location during evaluation: PACU Anesthesia Type: General Level of consciousness: awake and alert Pain management: pain level controlled Vital Signs Assessment: post-procedure vital signs reviewed and stable Respiratory status: spontaneous breathing, nonlabored ventilation, respiratory function stable and patient connected to nasal cannula oxygen Cardiovascular status: blood pressure returned to baseline and stable Postop Assessment: no apparent nausea or vomiting Anesthetic complications: no    Last Vitals:  Vitals:   10/15/19 1145 10/15/19 1152  BP: (!) 150/92   Pulse: 76 87  Resp: 11 (!) 22  Temp:    SpO2: 100% 98%    Last Pain:  Vitals:   10/15/19 1152  TempSrc:   PainSc: 0-No pain                 Jamel Holzmann S

## 2019-10-15 NOTE — Transfer of Care (Signed)
Immediate Anesthesia Transfer of Care Note  Patient: Shelby Campbell  Procedure(s) Performed: Procedure(s) (LRB): CYSTOSCOPY WITH RETROGRADE PYELOGRAM, URETEROSCOPY AND STENT PLACEMENT, (Right)  Patient Location: PACU  Anesthesia Type: General  Level of Consciousness: awake, sedated, patient cooperative and responds to stimulation  Airway & Oxygen Therapy: Patient Spontanous Breathing and Patient connected to [place] 02 and soft FM   Post-op Assessment: Report given to PACU RN, Post -op Vital signs reviewed and stable and Patient moving all extremities  Post vital signs: Reviewed and stable  Complications: No apparent anesthesia complications

## 2019-10-15 NOTE — Op Note (Addendum)
Preoperative diagnosis: right hydronephrosis  Postoperative diagnosis: right hydronephrosis  Procedure:  1. Cystoscopy 2. Right diagnostic ureteroscopy 3. right 68F x 26 ureteral stent placement  4. right retrograde pyelography with interpretation 5. Aborted laser incision of narrowed ureter  Surgeon: Jacalyn Lefevre, MD  Anesthesia: General  Complications: None  Intraoperative findings: right retrograde pyelography demonstrated hydronephrosis to distal ureter; no filling defect noted  EBL: Minimal  Specimens: 1. none  Disposition of specimens: [facility] for stone analysis  Indication: Shelby Campbell is a 44 y.o.   patient with a right hydronephrosis and associated right symptoms. After reviewing the management options for treatment, the patient elected to proceed with the above surgical procedure(s). We have discussed the potential benefits and risks of the procedure, side effects of the proposed treatment, the likelihood of the patient achieving the goals of the procedure, and any potential problems that might occur during the procedure or recuperation. Informed consent has been obtained.   Description of procedure:  The patient was taken to the operating room and general anesthesia was induced.  The patient was placed in the dorsal lithotomy position, prepped and draped in the usual sterile fashion, and preoperative antibiotics were administered. A preoperative time-out was performed.   Cystourethroscopy was performed.  The patient's urethra was examined and was normal. The bladder was then systematically examined in its entirety. There was no evidence for any bladder tumors, stones, or other mucosal pathology.    Attention then turned to the right ureteral orifice and a ureteral catheter was used to intubate the ureteral orifice.  Omnipaque contrast was injected through the ureteral catheter and a retrograde pyelogram was performed with findings as dictated  above.  A 0.38 sensor guidewire was then advanced up the right ureter into the renal pelvis under fluoroscopic guidance. The 4.5 Fr semirigid ureteroscope was then advanced into the ureter next to the guidewire to the left of the UPJ without difficulty.  No mass, calculus, or stricture was seen.  There was a tortuous area that appeared narrow.  At this point the decision was made to use a 200 micron laser fiber to incise that area.  The fiber was advanced but did not fire.  Further inspection of the area appeared that the ureter was wide ope and there was no difficulty traversing the scope. The decision was made not to laser the ureter at this time.    The wire was then backloaded through the cystoscope and a ureteral stent was advance over the wire using Seldinger technique.  The stent was positioned appropriately under fluoroscopic and cystoscopic guidance.  The wire was then removed with an adequate stent curl noted in the renal pelvis as well as in the bladder.  The bladder was then emptied and the procedure ended.  The patient appeared to tolerate the procedure well and without complications.  The patient was able to be awakened and transferred to the recovery unit in satisfactory condition.   Disposition: The tether of the stent was left on and tucked inside the patient's vagina.  Instructions for removing the stent have been provided to the patient. The patient will remove her stent at home on Monday, June 14.

## 2019-10-15 NOTE — Anesthesia Procedure Notes (Signed)
Procedure Name: LMA Insertion Date/Time: 10/15/2019 10:30 AM Performed by: Justice Rocher, CRNA Pre-anesthesia Checklist: Patient identified, Emergency Drugs available, Suction available, Patient being monitored and Timeout performed Patient Re-evaluated:Patient Re-evaluated prior to induction Oxygen Delivery Method: Circle system utilized Preoxygenation: Pre-oxygenation with 100% oxygen Induction Type: IV induction Ventilation: Mask ventilation without difficulty LMA: LMA inserted LMA Size: 4.0 Number of attempts: 1 Airway Equipment and Method: Bite block Placement Confirmation: positive ETCO2,  breath sounds checked- equal and bilateral and CO2 detector Tube secured with: Tape Dental Injury: Teeth and Oropharynx as per pre-operative assessment  Comments: Pt explained front venteer chipped/ cracked in preop

## 2019-10-15 NOTE — Interval H&P Note (Signed)
History and Physical Interval Note:  10/15/2019 9:36 AM  Shelby Campbell  has presented today for surgery, with the diagnosis of RIGHT HYDRONEPHROSIS.  The various methods of treatment have been discussed with the patient and family. After consideration of risks, benefits and other options for treatment, the patient has consented to  Procedure(s) with comments: CYSTOSCOPY WITH RETROGRADE PYELOGRAM, URETEROSCOPY AND STENT PLACEMENT (Right) - 1 HR as a surgical intervention.  The patient's history has been reviewed, patient examined, no change in status, stable for surgery.  I have reviewed the patient's chart and labs.  Questions were answered to the patient's satisfaction.     Juvencio Verdi D Dola Lunsford

## 2019-10-16 NOTE — Progress Notes (Signed)
Office Visit Note  Patient: Shelby Campbell             Date of Birth: 13-Mar-1976           MRN: [id number]             PCP: [name], MD Referring: [name], MD Visit Date: 10/30/2019 Occupation: @GUAROCC @  Subjective:  Positive ANA.Marland Kitchen   History of Present Illness: Shelby Campbell is a 44 y.o. female with history of positive ANA.  She states she has intermittent discomfort in her hands but no joint swelling.  She denies any history of oral ulcers, nasal ulcers, malar rash, photosensitivity or Raynaud's phenomenon.  She has been seeing Dr. Royce Macadamia for low GFR and mild proteinuria.  She states she has had some lower abdominal pain and had extensive urology work-up which was negative.  She also saw GYN and the work-up has been negative so far.  Activities of Daily Living:  Patient reports morning stiffness for 10-15  minutes.   Patient Denies nocturnal pain.  Difficulty dressing/grooming: Denies Difficulty climbing stairs: Denies Difficulty getting out of chair: Denies Difficulty using hands for taps, buttons, cutlery, and/or writing: Reports  Review of Systems  Constitutional: Negative for fatigue.  HENT: Negative for mouth sores, mouth dryness and nose dryness.   Eyes: Negative for itching and dryness.  Respiratory: Negative for shortness of breath and difficulty breathing.   Cardiovascular: Negative for chest pain and palpitations.  Gastrointestinal: Negative for blood in stool, constipation and diarrhea.  Endocrine: Negative for increased urination.  Genitourinary: Negative for difficulty urinating.  Musculoskeletal: Positive for morning stiffness. Negative for arthralgias, joint pain, joint swelling, myalgias, muscle tenderness and myalgias.  Skin: Negative for color change, rash and redness.  Allergic/Immunologic: Negative for susceptible to infections.  Neurological: Negative for dizziness, numbness, headaches, memory loss and weakness.  Hematological: Positive for  bruising/bleeding tendency.  Psychiatric/Behavioral: Negative for confusion.    PMFS History:  Patient Active Problem List   Diagnosis Date Noted  . Chronic renal disease, stage III 04/11/2018  . Asthma 03/01/2018  . Benign hypertension with chronic kidney disease 02/26/2018  . Chronic gastritis, mild.  H Pylori negative 07/31/2013  . Chronic cholecystitis with calculus 07/31/2013    Past Medical History:  Diagnosis Date  . Asthma   . Chronic kidney disease    stage 3 a per lov dr Cecille Rubin [facility] 10-01-2019 on chart  . Hiatal hernia   . History of mitral valve prolapse    no problems since birth of son 21 yrs ago (2008)  . Hypertension   . 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 Shortness of breath    related to exertion    Family History  Problem Relation Age of Onset  . Hypertension Mother   . Arthritis Mother   . Hypertension Father   . Prostate cancer Father   . Arthritis Father   . Gout Father   . Healthy Son   . Healthy Daughter    Past Surgical History:  Procedure Laterality Date  . AXILLARY LYMPH NODE BIOPSY  2010   right   . CHOLECYSTECTOMY    . CYSTOSCOPY WITH RETROGRADE PYELOGRAM, URETEROSCOPY AND STENT PLACEMENT Right 10/15/2019   Procedure: CYSTOSCOPY WITH RETROGRADE PYELOGRAM, URETEROSCOPY AND STENT PLACEMENT, attempted laser of stricture;  Surgeon: Robley Fries, MD;  Location: [facility];  Service: Urology;  Laterality: Right;  1 HR  . DILITATION & CURRETTAGE/HYSTROSCOPY WITH NOVASURE ABLATION N/A  05/04/2016   Procedure: DILATATION & CURETTAGE/HYSTEROSCOPY WITH NOVASURE ABLATION;  Surgeon: Servando Salina, MD;  Location: [facility];  Service: Gynecology;  Laterality: N/A;  . LAPAROSCOPIC CHOLECYSTECTOMY SINGLE PORT N/A 09/10/2013   Procedure: LAPAROSCOPIC CHOLECYSTECTOMY SINGLE SITE WITH CHOLANGIOGRAM;  Surgeon: Adin Hector, MD;  Location: [facility];  Service: General;  Laterality: N/A;  . TUBAL LIGATION     Social History   Social  History Narrative  . Not on file   Immunization History  Administered Date(s) Administered  . Influenza,inj,Quad PF,6+ Mos 03/01/2018  . Td 04/12/2003  . Tdap 12/20/2010     Objective: Vital Signs: BP 140/88 (BP Location: Left Arm, Patient Position: Sitting, Cuff Size: Normal)   Pulse 66   Resp 13   Ht 5\' 5"  (1.651 m)   Wt 137 lb (62.1 kg)   BMI 22.80 kg/m    Physical Exam Vitals and nursing note reviewed.  Constitutional:      Appearance: She is well-developed.  HENT:     Head: Normocephalic and atraumatic.  Eyes:     Conjunctiva/sclera: Conjunctivae normal.  Cardiovascular:     Rate and Rhythm: Normal rate and regular rhythm.     Heart sounds: Normal heart sounds.  Pulmonary:     Effort: Pulmonary effort is normal.     Breath sounds: Normal breath sounds.  Abdominal:     General: Bowel sounds are normal.     Palpations: Abdomen is soft.  Musculoskeletal:     Cervical back: Normal range of motion.  Lymphadenopathy:     Cervical: No cervical adenopathy.  Skin:    General: Skin is warm and dry.     Capillary Refill: Capillary refill takes less than 2 seconds.  Neurological:     Mental Status: She is alert and oriented to person, place, and time.  Psychiatric:        Behavior: Behavior normal.      Musculoskeletal Exam: C-spine thoracic and lumbar spine with good range of motion.  Shoulder joints, elbow joints, wrist joints, MCPs PIPs and DIPs with good range of motion with no synovitis.  Hip joints, knee joints, ankles, MTPs and PIPs with good range of motion with no synovitis.  CDAI Exam: CDAI Score: -- Patient Global: --; Provider Global: -- Swollen: --; Tender: -- Joint Exam 10/30/2019   No joint exam has been documented for this visit   There is currently no information documented on the homunculus. Go to the Rheumatology activity and complete the homunculus joint exam.  Investigation: No additional findings.  Imaging: US RENAL  Result Date:  10/25/2019 CLINICAL DATA:  Hydronephrosis.  Consider renal disease EXAM: RENAL / URINARY TRACT ULTRASOUND COMPLETE COMPARISON:  CT 08/19/2019 FINDINGS: Right Kidney: Renal measurements: 10.3 x 4.4 x 4.4 cm = volume: 106.7 mL. Moderate hydronephrosis. No obstructing lesion identified. Left Kidney: Renal measurements: 11.0 x 3.3 x 4.7 cm = volume: 89 mL. No hydronephrosis. Bladder: Appears normal for degree of bladder distention. Other: Bilateral ureteral jets noted in the bladder IMPRESSION: Moderate RIGHT hydronephrosis similar to comparison CT. Normal LEFT kidney. Electronically Signed   By: Suzy Bouchard M.D.   On: 10/25/2019 15:39   US PELVIC COMPLETE WITH TRANSVAGINAL  Result Date: 10/25/2019 CLINICAL DATA:  Right adnexal mass and right hydronephrosis on prior CT. Previous history of endometrial ablation. EXAM: TRANSABDOMINAL AND TRANSVAGINAL ULTRASOUND OF PELVIS TECHNIQUE: Both transabdominal and transvaginal ultrasound examinations of the pelvis were performed. Transabdominal technique was performed for global imaging of the pelvis including uterus,  ovaries, adnexal regions, and pelvic cul-de-sac. It was necessary to proceed with endovaginal exam following the transabdominal exam to visualize the ovaries and adnexa. COMPARISON:  CT on 08/19/2019 from [facility] FINDINGS: Uterus Measurements: 7.9 x 4.2 x 5.1 cm = volume: 89 mL. Retroflexed. No fibroids or other masses identified. Endometrium Thickness: 9 mm.  No focal abnormality visualized. Right ovary Measurements: 5.1 x 2.6 x 4.0 cm = volume: 27.7 mL. Normal appearance/no adnexal mass. Left ovary Measurements: 0.9 x 1.8 x 2.8 cm = volume: 9.8 mL. Normal appearance/no adnexal mass. Other findings No abnormal free fluid. IMPRESSION: Normal appearance of uterus and both ovaries. No pelvic mass or other significant abnormality identified. Electronically Signed   By: Marlaine Hind M.D.   On: 10/25/2019 15:53    Recent Labs: Lab Results   Component Value Date   WBC 6.7 08/19/2019   HGB 12.2 10/15/2019   PLT 300 08/19/2019   NA 139 10/15/2019   K 4.1 10/15/2019   CL 102 10/15/2019   CO2 26 08/19/2019   GLUCOSE 87 10/15/2019   BUN 20 10/15/2019   CREATININE 1.60 (H) 10/15/2019   BILITOT 0.6 08/19/2019   ALKPHOS 45 08/19/2019   AST 27 08/19/2019   ALT 18 08/19/2019   PROT 8.3 (H) 08/19/2019   ALBUMIN 4.4 08/19/2019   CALCIUM 9.4 08/19/2019   GFRAA 56 (L) 08/19/2019  July 30, 2019 C3-C4 normal, ANA 1: 80 speckled, RNP positive, double-stranded DNA negative, Ro negative, La negative, Huyser negative August 19, 2019 UA showed 30 mg/dL protein  Speciality Comments: No specialty comments available.  Procedures:  No procedures performed Allergies: Nsaids, Other, and Sulfa antibiotics   Assessment / Plan:     Visit Diagnoses: Pain in both hands - All autoimmune work-up was negative.  X-rays were unremarkable.  Patient complains of intermittent pain and discomfort in her hands.  She had no synovitis on examination.  Primary osteoarthritis of both feet - mild.    Positive ANA (antinuclear antibody) - ANA 1: 80 speckled, ENA negative except RNP which was positive recently., C3-C4 normal patient had no clinical features of autoimmune disease.  History of sicca symptoms - Plan: CBC with Differential/Platelet, COMPLETE METABOLIC PANEL WITH GFR, Urinalysis, Routine w reflex microscopic, ANA, Anti-DNA antibody, double-stranded, C3 and C4, Sedimentation rate, RNP Antibody  Family history of rheumatoid arthritis - Mother  Family history of systemic lupus erythematosus - First cousin  Stage 3 chronic kidney disease, unspecified whether stage 3a or 3b CKD - Followed by Dr. Royce Macadamia.  Patient states she was switched to lisinopril.  She also had mild proteinuria.  Mild intermittent asthma without complication  History of gastritis  Benign hypertension with chronic kidney disease  Orders: Orders Placed This Encounter   Procedures  . CBC with Differential/Platelet  . COMPLETE METABOLIC PANEL WITH GFR  . Urinalysis, Routine w reflex microscopic  . ANA  . Anti-DNA antibody, double-stranded  . C3 and C4  . Sedimentation rate  . RNP Antibody   No orders of the defined types were placed in this encounter.   Follow-Up Instructions: Return in about 6 months (around 04/30/2020) for +ANA, positive RNP.   Bo Merino, MD  Note - This record has been created using Editor, commissioning.  Chart creation errors have been sought, but may not always  have been located. Such creation errors do not reflect on  the standard of medical care.

## 2019-10-18 ENCOUNTER — Encounter (HOSPITAL_BASED_OUTPATIENT_CLINIC_OR_DEPARTMENT_OTHER): Payer: Self-pay | Admitting: Urology

## 2019-10-25 ENCOUNTER — Other Ambulatory Visit (HOSPITAL_COMMUNITY): Payer: Self-pay | Admitting: Obstetrics and Gynecology

## 2019-10-25 ENCOUNTER — Ambulatory Visit (HOSPITAL_COMMUNITY)
Admission: RE | Admit: 2019-10-25 | Discharge: 2019-10-25 | Disposition: A | Discharge status: Home / Self Care | Payer: BC Managed Care – PPO | Source: Ambulatory Visit | Attending: Obstetrics and Gynecology | Admitting: Obstetrics and Gynecology

## 2019-10-25 ENCOUNTER — Other Ambulatory Visit: Payer: Self-pay | Admitting: Urology

## 2019-10-25 ENCOUNTER — Other Ambulatory Visit: Payer: Self-pay

## 2019-10-25 DIAGNOSIS — N9489 Other specified conditions associated with female genital organs and menstrual cycle: Secondary | ICD-10-CM | POA: Insufficient documentation

## 2019-10-25 DIAGNOSIS — N1330 Unspecified hydronephrosis: Secondary | ICD-10-CM

## 2019-10-30 ENCOUNTER — Other Ambulatory Visit: Payer: Self-pay

## 2019-10-30 ENCOUNTER — Ambulatory Visit (INDEPENDENT_AMBULATORY_CARE_PROVIDER_SITE_OTHER): Payer: BC Managed Care – PPO | Admitting: Rheumatology

## 2019-10-30 ENCOUNTER — Encounter: Payer: Self-pay | Admitting: Rheumatology

## 2019-10-30 VITALS — BP 140/88 | HR 66 | Resp 13 | Ht 65.0 in | Wt 137.0 lb

## 2019-10-30 DIAGNOSIS — Z8719 Personal history of other diseases of the digestive system: Secondary | ICD-10-CM

## 2019-10-30 DIAGNOSIS — M79642 Pain in left hand: Secondary | ICD-10-CM

## 2019-10-30 DIAGNOSIS — N183 Chronic kidney disease, stage 3 unspecified: Secondary | ICD-10-CM

## 2019-10-30 DIAGNOSIS — Z8261 Family history of arthritis: Secondary | ICD-10-CM | POA: Diagnosis not present

## 2019-10-30 DIAGNOSIS — M19071 Primary osteoarthritis, right ankle and foot: Secondary | ICD-10-CM | POA: Diagnosis not present

## 2019-10-30 DIAGNOSIS — M19072 Primary osteoarthritis, left ankle and foot: Secondary | ICD-10-CM

## 2019-10-30 DIAGNOSIS — R768 Other specified abnormal immunological findings in serum: Secondary | ICD-10-CM

## 2019-10-30 DIAGNOSIS — I129 Hypertensive chronic kidney disease with stage 1 through stage 4 chronic kidney disease, or unspecified chronic kidney disease: Secondary | ICD-10-CM

## 2019-10-30 DIAGNOSIS — M79641 Pain in right hand: Secondary | ICD-10-CM

## 2019-10-30 DIAGNOSIS — J4520 Mild intermittent asthma, uncomplicated: Secondary | ICD-10-CM

## 2019-10-30 DIAGNOSIS — Z8269 Family history of other diseases of the musculoskeletal system and connective tissue: Secondary | ICD-10-CM

## 2019-11-04 ENCOUNTER — Other Ambulatory Visit: Payer: Self-pay | Admitting: Obstetrics and Gynecology

## 2019-11-08 ENCOUNTER — Ambulatory Visit
Admission: RE | Admit: 2019-11-08 | Discharge: 2019-11-08 | Disposition: A | Discharge status: Home / Self Care | Payer: BC Managed Care – PPO | Source: Ambulatory Visit | Attending: Urology | Admitting: Urology

## 2019-11-08 DIAGNOSIS — N1330 Unspecified hydronephrosis: Secondary | ICD-10-CM

## 2019-11-08 MED ORDER — IOPAMIDOL (ISOVUE-300) INJECTION 61%
100.0000 mL | Freq: Once | INTRAVENOUS | Status: AC | PRN
  Administered 2019-11-08: 100 mL via INTRAVENOUS

## 2019-11-21 ENCOUNTER — Other Ambulatory Visit: Payer: Self-pay | Admitting: Obstetrics and Gynecology

## 2020-01-24 LAB — HM MAMMOGRAPHY

## 2020-03-31 ENCOUNTER — Other Ambulatory Visit: Payer: Self-pay | Admitting: Obstetrics and Gynecology

## 2020-03-31 DIAGNOSIS — N1330 Unspecified hydronephrosis: Secondary | ICD-10-CM

## 2020-04-08 ENCOUNTER — Encounter: Payer: Self-pay | Admitting: Internal Medicine

## 2020-04-08 ENCOUNTER — Ambulatory Visit (INDEPENDENT_AMBULATORY_CARE_PROVIDER_SITE_OTHER): Payer: BC Managed Care – PPO | Admitting: Internal Medicine

## 2020-04-08 ENCOUNTER — Other Ambulatory Visit: Payer: Self-pay

## 2020-04-08 VITALS — BP 116/84 | HR 64 | Temp 97.5°F | Ht 65.0 in | Wt 146.0 lb

## 2020-04-08 DIAGNOSIS — N183 Chronic kidney disease, stage 3 unspecified: Secondary | ICD-10-CM | POA: Diagnosis not present

## 2020-04-08 DIAGNOSIS — I129 Hypertensive chronic kidney disease with stage 1 through stage 4 chronic kidney disease, or unspecified chronic kidney disease: Secondary | ICD-10-CM

## 2020-04-08 DIAGNOSIS — L299 Pruritus, unspecified: Secondary | ICD-10-CM

## 2020-04-08 DIAGNOSIS — Z0000 Encounter for general adult medical examination without abnormal findings: Secondary | ICD-10-CM

## 2020-04-08 DIAGNOSIS — Z23 Encounter for immunization: Secondary | ICD-10-CM

## 2020-04-08 LAB — POCT URINALYSIS DIPSTICK
Blood, UA: NEGATIVE
Glucose, UA: NEGATIVE
Ketones, UA: NEGATIVE
Leukocytes, UA: NEGATIVE
Nitrite, UA: NEGATIVE
Protein, UA: NEGATIVE
Spec Grav, UA: >=1.03 — AB (ref 1.010–1.025)
Urobilinogen, UA: 0.2 E.U./dL
pH, UA: 5.5 (ref 5.0–8.0)

## 2020-04-08 LAB — POCT UA - MICROALBUMIN
Albumin/Creatinine Ratio, Urine, POC: 30
Creatinine, POC: 300 mg/dL
Microalbumin Ur, POC: 10 mg/L

## 2020-04-08 MED ORDER — BREO ELLIPTA 200-25 MCG/INH IN AEPB: 1.0000 | INHALATION_SPRAY | Freq: Every day | RESPIRATORY_TRACT | 5 refills | Status: DC

## 2020-04-08 NOTE — Patient Instructions (Signed)
Health Maintenance, Female Adopting a healthy lifestyle and getting preventive care are important in promoting health and wellness. Ask your health care provider about:  The right schedule for you to have regular tests and exams.  Things you can do on your own to prevent diseases and keep yourself healthy. What should I know about diet, weight, and exercise? Eat a healthy diet   Eat a diet that includes plenty of vegetables, fruits, low-fat dairy products, and lean protein.  Do not eat a lot of foods that are high in solid fats, added sugars, or sodium. Maintain a healthy weight Body mass index (BMI) is used to identify weight problems. It estimates body fat based on height and weight. Your health care provider can help determine your BMI and help you achieve or maintain a healthy weight. Get regular exercise Get regular exercise. This is one of the most important things you can do for your health. Most adults should:  Exercise for at least 150 minutes each week. The exercise should increase your heart rate and make you sweat (moderate-intensity exercise).  Do strengthening exercises at least twice a week. This is in addition to the moderate-intensity exercise.  Spend less time sitting. Even light physical activity can be beneficial. Watch cholesterol and blood lipids Have your blood tested for lipids and cholesterol at 42 years of age, then have this test every 5 years. Have your cholesterol levels checked more often if:  Your lipid or cholesterol levels are high.  You are older than 42 years of age.  You are at high risk for heart disease. What should I know about cancer screening? Depending on your health history and family history, you may need to have cancer screening at various ages. This may include screening for:  Breast cancer.  Cervical cancer.  Colorectal cancer.  Skin cancer.  Lung cancer. What should I know about heart disease, diabetes, and high blood  pressure? Blood pressure and heart disease  High blood pressure causes heart disease and increases the risk of stroke. This is more likely to develop in people who have high blood pressure readings, are of African descent, or are overweight.  Have your blood pressure checked: ? Every 3-5 years if you are 18-39 years of age. ? Every year if you are 40 years old or older. Diabetes Have regular diabetes screenings. This checks your fasting blood sugar level. Have the screening done:  Once every three years after age 40 if you are at a normal weight and have a low risk for diabetes.  More often and at a younger age if you are overweight or have a high risk for diabetes. What should I know about preventing infection? Hepatitis B If you have a higher risk for hepatitis B, you should be screened for this virus. Talk with your health care provider to find out if you are at risk for hepatitis B infection. Hepatitis C Testing is recommended for:  Everyone born from 1945 through 1965.  Anyone with known risk factors for hepatitis C. Sexually transmitted infections (STIs)  Get screened for STIs, including gonorrhea and chlamydia, if: ? You are sexually active and are younger than 42 years of age. ? You are older than 42 years of age and your health care provider tells you that you are at risk for this type of infection. ? Your sexual activity has changed since you were last screened, and you are at increased risk for chlamydia or gonorrhea. Ask your health care provider if   you are at risk.  Ask your health care provider about whether you are at high risk for HIV. Your health care provider may recommend a prescription medicine to help prevent HIV infection. If you choose to take medicine to prevent HIV, you should first get tested for HIV. You should then be tested every 3 months for as long as you are taking the medicine. Pregnancy  If you are about to stop having your period (premenopausal) and  you may become pregnant, seek counseling before you get pregnant.  Take 400 to 800 micrograms (mcg) of folic acid every day if you become pregnant.  Ask for birth control (contraception) if you want to prevent pregnancy. Osteoporosis and menopause Osteoporosis is a disease in which the bones lose minerals and strength with aging. This can result in bone fractures. If you are 65 years old or older, or if you are at risk for osteoporosis and fractures, ask your health care provider if you should:  Be screened for bone loss.  Take a calcium or vitamin D supplement to lower your risk of fractures.  Be given hormone replacement therapy (HRT) to treat symptoms of menopause. Follow these instructions at home: Lifestyle  Do not use any products that contain nicotine or tobacco, such as cigarettes, e-cigarettes, and chewing tobacco. If you need help quitting, ask your health care provider.  Do not use street drugs.  Do not share needles.  Ask your health care provider for help if you need support or information about quitting drugs. Alcohol use  Do not drink alcohol if: ? Your health care provider tells you not to drink. ? You are pregnant, may be pregnant, or are planning to become pregnant.  If you drink alcohol: ? Limit how much you use to 0-1 drink a day. ? Limit intake if you are breastfeeding.  Be aware of how much alcohol is in your drink. In the [place] one drink equals one 12 oz bottle of beer (355 mL), one 5 oz glass of wine (148 mL), or one 1 oz glass of hard liquor (44 mL). General instructions  Schedule regular health, dental, and eye exams.  Stay current with your vaccines.  Tell your health care provider if: ? You often feel depressed. ? You have ever been abused or do not feel safe at home. Summary  Adopting a healthy lifestyle and getting preventive care are important in promoting health and wellness.  Follow your health care provider's instructions about healthy  diet, exercising, and getting tested or screened for diseases.  Follow your health care provider's instructions on monitoring your cholesterol and blood pressure. This information is not intended to replace advice given to you by your health care provider. Make sure you discuss any questions you have with your health care provider. Document Revised: 04/18/2018 Document Reviewed: 04/18/2018 Elsevier Patient Education  2020 [employer]  

## 2020-04-08 NOTE — Progress Notes (Signed)
I,Shelby Campbell,acting as a Education administrator for Shelby Greenland, MD.,have documented all relevant documentation on the behalf of Shelby Greenland, MD,as directed by  Shelby Greenland, MD while in the presence of Shelby Greenland, MD.  This visit occurred during the SARS-CoV-2 public health emergency.  Safety protocols were in place, including screening questions prior to the visit, additional usage of staff PPE, and extensive cleaning of exam room while observing appropriate contact time as indicated for disinfecting solutions.  Subjective:     Patient ID: Shelby Campbell , female    DOB: March 19, 1976 , 44 y.o.   MRN: [id number]   Chief Complaint  Patient presents with  . Annual Exam  . Hypertension    HPI  The patient is here for a physical examination. She is followed by Dr. Garwin Brothers for her pap smears. She was last seen Nov 2021.   Hypertension This is a chronic problem. The current episode started more than 1 year ago. The problem has been gradually improving since onset. The problem is uncontrolled. Pertinent negatives include no blurred vision, chest pain, palpitations or shortness of breath. The current treatment provides moderate improvement. Hypertensive end-organ damage includes kidney disease.     Past Medical History:  Diagnosis Date  . Asthma   . Chronic kidney disease    stage 3 a per lov dr Cecille Rubin foster 10-01-2019 on chart  . Hiatal hernia   . History of mitral valve prolapse    no problems since birth of son 45 yrs ago (2008)  . Hydronephrosis 2021   Per patient  . Hypertension   . 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 Shortness of breath    related to exertion     Family History  Problem Relation Age of Onset  . Hypertension Mother   . Arthritis Mother   . Hypertension Father   . Prostate cancer Father   . Arthritis Father   . Gout Father   . Healthy Son   . Healthy Daughter      Current Outpatient Medications:  .  acetaminophen (TYLENOL) 500 MG tablet, Take  1,000 mg by mouth 2 (two) times daily as needed for headache (or flank pain)., Disp: , Rfl:  .  albuterol (VENTOLIN HFA) 108 (90 Base) MCG/ACT inhaler, Inhale 2 puffs into the lungs every 6 (six) hours as needed for wheezing or shortness of breath., Disp: 18 g, Rfl: 11 .  amLODipine (NORVASC) 2.5 MG tablet, Take 2.5 mg by mouth daily., Disp: , Rfl:  .  Cholecalciferol (VITAMIN D) 125 MCG (5000 UT) CAPS, Take 1,000 Units by mouth daily. , Disp: , Rfl:  .  metoprolol succinate (TOPROL-XL) 50 MG 24 hr tablet, Take 50 mg by mouth daily., Disp: , Rfl:  .  Multiple Vitamin (MULTIVITAMIN) tablet, Take 1 tablet by mouth daily., Disp: , Rfl:  .  fluticasone furoate-vilanterol (BREO ELLIPTA) 200-25 MCG/INH AEPB, Inhale 1 puff into the lungs daily., Disp: 1 each, Rfl: 5   Allergies  Allergen Reactions  . Nsaids     And ibuprofen cannot take due to chronic kidney disease  . Other Other (See Comments)    Sulfonamides, sulfonylureas, barbiturates, antifungals, ketamine, etomidate, rifapentine, rifampicin, rifabutine, nitrofurantoin, metronidazole, ergot derivatives, indinavir, nevirapine, ritonavir, saquinavir, progestogens, progesterones, carbamazepine, phenytoin, valproate, oxycodone, pentazocine, cocaine, gold antirheumatics agents, sodium aurothiomalate, methyldopa, fenfluramine, ethosuzimide, flupentixol, flutamide, disulfiram, lidocaine   . Sulfa Antibiotics Other (See Comments)    Heart beating fast  The patient states she uses none for birth control. Last LMP was No LMP recorded. Patient has had an ablation.. Negative for Dysmenorrhea. Negative for: breast discharge, breast lump(s), breast pain and breast self exam. Associated symptoms include abnormal vaginal bleeding. Pertinent negatives include abnormal bleeding (hematology), anxiety, decreased libido, depression, difficulty falling sleep, dyspareunia, history of infertility, nocturia, sexual dysfunction, sleep disturbances, urinary  incontinence, urinary urgency, vaginal discharge and vaginal itching. Diet regular.The patient states her exercise level is  minimal recently.   . The patient's tobacco use is:  Social History   Tobacco Use  Smoking Status Never Smoker  Smokeless Tobacco Never Used  . She has been exposed to passive smoke. The patient's alcohol use is:  Social History   Substance and Sexual Activity  Alcohol Use Yes   Comment: occasional    Review of Systems  Constitutional: Negative.   HENT: Negative.   Eyes: Negative.  Negative for blurred vision.  Respiratory: Negative.  Negative for shortness of breath.   Cardiovascular: Negative.  Negative for chest pain and palpitations.  Gastrointestinal: Negative.   Endocrine: Negative.   Genitourinary: Negative.   Musculoskeletal: Negative.   Skin: Negative.        She c/o gen. Itching. Not sure what is causing her sx. Wants to check for food allergies.  Allergic/Immunologic: Negative.   Neurological: Negative.   Hematological: Negative.   Psychiatric/Behavioral: Negative.      Today's Vitals   04/08/20 0859  BP: 116/84  Pulse: 64  Temp: (!) 97.5 F (36.4 C)  TempSrc: Oral  Weight: 146 lb (66.2 kg)  Height: 5\' 5"  (1.651 m)   Body mass index is 24.3 kg/m.  Wt Readings from Last 3 Encounters:  04/23/20 151 lb 12.8 oz (68.9 kg)  04/08/20 146 lb (66.2 kg)  10/30/19 137 lb (62.1 kg)   Objective:  Physical Exam Vitals and nursing note reviewed.  Constitutional:      General: She is not in acute distress.    Appearance: Normal appearance. She is well-developed.  HENT:     Head: Normocephalic and atraumatic.     Right Ear: Hearing, tympanic membrane, ear canal and external ear normal. There is no impacted cerumen.     Left Ear: Hearing, tympanic membrane, ear canal and external ear normal. There is no impacted cerumen.     Nose:     Comments: Deferred, masked    Mouth/Throat:     Comments: Deferred, masked Eyes:     General: Lids are  normal.     Extraocular Movements: Extraocular movements intact.     Conjunctiva/sclera: Conjunctivae normal.  Neck:     Thyroid: No thyroid mass.     Vascular: No carotid bruit.  Cardiovascular:     Rate and Rhythm: Normal rate and regular rhythm.     Pulses: Normal pulses.     Heart sounds: Normal heart sounds. No murmur heard.   Pulmonary:     Effort: Pulmonary effort is normal.     Breath sounds: Normal breath sounds.  Chest:  Breasts:     Tanner Score is 5.     Right: Normal.     Left: Normal.    Abdominal:     General: Bowel sounds are normal. There is no distension.     Palpations: Abdomen is soft.     Tenderness: There is no abdominal tenderness.  Genitourinary:    Comments: Deferred Musculoskeletal:        General: No swelling. Normal range of motion.  Cervical back: Full passive range of motion without pain, normal range of motion and neck supple.     Right lower leg: No edema.     Left lower leg: No edema.  Skin:    General: Skin is warm and dry.     Capillary Refill: Capillary refill takes less than 2 seconds.  Neurological:     General: No focal deficit present.     Mental Status: She is alert and oriented to person, place, and time.     Cranial Nerves: No cranial nerve deficit.     Sensory: No sensory deficit.  Psychiatric:        Mood and Affect: Mood normal.        Behavior: Behavior normal.        Thought Content: Thought content normal.        Judgment: Judgment normal.         Assessment And Plan:     1. Routine general medical examination at health care facility Comments: A full exam was performed. Importance of monthly self breast exams was discussed the patient. PATIENT IS ADVISED TO GET 30-45 MINUTES REGULAR EXERCISE NO LESS THAN FOUR TO FIVE DAYS PER WEEK - BOTH WEIGHTBEARING EXERCISES AND AEROBIC ARE RECOMMENDED.  PATIENT IS ADVISED TO FOLLOW A HEALTHY DIET WITH AT LEAST SIX FRUITS/VEGGIES PER DAY, DECREASE INTAKE OF RED MEAT, AND TO  INCREASE FISH INTAKE TO TWO DAYS PER WEEK.  MEATS/FISH SHOULD NOT BE FRIED, BAKED OR BROILED IS PREFERABLE.  I SUGGEST WEARING SPF 50 SUNSCREEN ON EXPOSED PARTS AND ESPECIALLY WHEN IN THE DIRECT SUNLIGHT FOR AN EXTENDED PERIOD OF TIME.  PLEASE AVOID FAST FOOD RESTAURANTS AND INCREASE YOUR WATER INTAKE.  - Hepatitis C antibody - CBC - CMP14+EGFR - Lipid panel  2. Benign hypertension with chronic kidney disease Comments: Chronic, well controlled. She will continue with current meds. EKg performed, SB w/o acute changes. She is encouraged to avoid adding salt to her foods. She will f/u in six months.  - amLODipine (NORVASC) 2.5 MG tablet; Take 2.5 mg by mouth daily. - metoprolol succinate (TOPROL-XL) 50 MG 24 hr tablet; Take 50 mg by mouth daily. - EKG 12-Lead - POCT Urinalysis Dipstick (81002) - POCT UA - Microalbumin  3. Stage 3 chronic kidney disease, unspecified whether stage 3a or 3b CKD ([place]) Comments: Chronic, now followed by Dr. Royce [facility]adamia at [facility]. I will request her Nov progress notes. Encouraged to stay well hydrated and keep BP well controlled.   4. Pr[place]itus Comments: I will check a food allergy panel today per her request. - Allergens(96) Foods  5. Need for vaccination Comments: She was given flu vaccine.  She has already received her COVID booster.  - Flu Vaccine QUAD 6+ mos PF IM (Fluarix Quad PF)   Patient was given opportunity to ask questions. Patient verbalized understanding of the plan and was able to repeat key elements of the plan. All questions were answered to their satisfaction.   Shelby Greenland, MD   I, Shelby Greenland, MD, have reviewed all documentation for this visit. The documentation on 04/25/20 for the exam, diagnosis, procedures, and orders are all accurate and complete.  THE PATIENT IS ENCOURAGED TO PRACTICE SOCIAL DISTANCING DUE TO THE COVID-19 PANDEMIC.

## 2020-04-09 NOTE — Progress Notes (Signed)
Office Visit Note  Patient: Shelby Campbell             Date of Birth: October 17, 1975           MRN: [id number]             PCP: [name], MD Referring: [name], MD Visit Date: 04/23/2020 Occupation: @GUAROCC @  Subjective:  Joint pain.   History of Present Illness: Shelby Campbell is a 44 y.o. female with history of positive ANA, positive RNP and osteoarthritis.  She states she recently was having pain and discomfort in her right knee joint for which she was seen at [facility].  She states that she had MRI of her knee joint.  After that she was told to use some topical agents.  There was no internal derangement.  He has been having some discomfort in her left thumb that she describes over her left first PIP joint.  She denies any history of oral ulcers, nasal ulcers, malar rash, photosensitivity, sicca symptoms, Raynaud's phenomenon or joint swelling.  Activities of Daily Living:  Patient reports morning stiffness for 0 minutes.   Patient Reports nocturnal pain.  Difficulty dressing/grooming: Denies Difficulty climbing stairs: Denies Difficulty getting out of chair: Denies Difficulty using hands for taps, buttons, cutlery, and/or writing: Reports  Review of Systems  Constitutional: Negative for fatigue.  HENT: Negative for mouth sores, mouth dryness and nose dryness.   Eyes: Negative for pain, itching, visual disturbance and dryness.  Respiratory: Negative for cough, hemoptysis, shortness of breath and difficulty breathing.   Cardiovascular: Negative for chest pain, palpitations and swelling in legs/feet.  Gastrointestinal: Negative for abdominal pain, blood in stool, constipation and diarrhea.  Endocrine: Negative for increased urination.  Genitourinary: Negative for painful urination.  Musculoskeletal: Positive for arthralgias, joint pain and morning stiffness. Negative for joint swelling, myalgias, muscle weakness, muscle tenderness and myalgias.  Skin: Negative for color  change, rash and redness.  Allergic/Immunologic: Negative for susceptible to infections.  Neurological: Negative for dizziness, numbness, headaches, memory loss and weakness.  Hematological: Negative for swollen glands.  Psychiatric/Behavioral: Negative for confusion and sleep disturbance.    PMFS History:  Patient Active Problem List   Diagnosis Date Noted  . Chronic renal disease, stage III ([place]) 04/11/2018  . Asthma 03/01/2018  . Benign hypertension with chronic kidney disease 02/26/2018  . Chronic gastritis, mild.  H Pylori negative 07/31/2013  . Chronic cholecystitis with calculus 07/31/2013    Past Medical History:  Diagnosis Date  . Asthma   . Chronic kidney disease    stage 3 a per lov dr Cecille Rubin foster 10-01-2019 on chart  . Hiatal hernia   . History of mitral valve prolapse    no problems since birth of son 56 yrs ago (2008)  . Hydronephrosis 2021   Per patient  . Hypertension   . 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 Shortness of breath    related to exertion    Family History  Problem Relation Age of Onset  . Hypertension Mother   . Arthritis Mother   . Hypertension Father   . Prostate cancer Father   . Arthritis Father   . Gout Father   . Healthy Son   . Healthy Daughter    Past Surgical History:  Procedure Laterality Date  . AXILLARY LYMPH NODE BIOPSY  2010   right   . CHOLECYSTECTOMY    . CYSTOSCOPY WITH RETROGRADE PYELOGRAM, URETEROSCOPY AND STENT PLACEMENT Right 10/15/2019  Procedure: CYSTOSCOPY WITH RETROGRADE PYELOGRAM, URETEROSCOPY AND STENT PLACEMENT, attempted laser of stricture;  Surgeon: Robley Fries, MD;  Location: [facility];  Service: Urology;  Laterality: Right;  1 HR  . DILITATION & CURRETTAGE/HYSTROSCOPY WITH NOVASURE ABLATION N/A 05/04/2016   Procedure: DILATATION & CURETTAGE/HYSTEROSCOPY WITH NOVASURE ABLATION;  Surgeon: Servando Salina, MD;  Location: [facility];  Service: Gynecology;  Laterality: N/A;  .  LAPAROSCOPIC CHOLECYSTECTOMY SINGLE PORT N/A 09/10/2013   Procedure: LAPAROSCOPIC CHOLECYSTECTOMY SINGLE SITE WITH CHOLANGIOGRAM;  Surgeon: Adin Hector, MD;  Location: [facility];  Service: General;  Laterality: N/A;  . TUBAL LIGATION     Social History   Social History Narrative  . Not on file   Immunization History  Administered Date(s) Administered  . Influenza,inj,Quad PF,6+ Mos 03/01/2018, 04/08/2020  . Janssen (J&J) SARS-COV-2 Vaccination 08/08/2019  . Moderna Sars-Covid-2 Vaccination 04/01/2020  . Td 04/12/2003  . Tdap 12/20/2010     Objective: Vital Signs: BP 129/73 (BP Location: Left Arm, Patient Position: Sitting, Cuff Size: Small)   Pulse 61   Ht 5\' 5"  (1.651 m)   Wt 151 lb 12.8 oz (68.9 kg)   BMI 25.26 kg/m    Physical Exam Vitals and nursing note reviewed.  Constitutional:      Appearance: She is well-developed and well-nourished.  HENT:     Head: Normocephalic and atraumatic.  Eyes:     Extraocular Movements: EOM normal.     Conjunctiva/sclera: Conjunctivae normal.  Cardiovascular:     Rate and Rhythm: Normal rate and regular rhythm.     Pulses: Intact distal pulses.     Heart sounds: Normal heart sounds.  Pulmonary:     Effort: Pulmonary effort is normal.     Breath sounds: Normal breath sounds.  Abdominal:     General: Bowel sounds are normal.     Palpations: Abdomen is soft.  Musculoskeletal:     Cervical back: Normal range of motion.  Lymphadenopathy:     Cervical: No cervical adenopathy.  Skin:    General: Skin is warm and dry.     Capillary Refill: Capillary refill takes less than 2 seconds.  Neurological:     Mental Status: She is alert and oriented to person, place, and time.  Psychiatric:        Mood and Affect: Mood and affect normal.        Behavior: Behavior normal.      Musculoskeletal Exam: C-spine, thoracic and lumbar spine with good range of motion.  Shoulder joints, elbow joints, wrist joints, MCPs PIPs and DIPs with good range  of motion with no synovitis.  Hip joints, knee joints, ankles, MTPs and PIPs with good range of motion with no synovitis.  CDAI Exam: CDAI Score: -- Patient Global: --; Provider Global: -- Swollen: --; Tender: -- Joint Exam 04/23/2020   No joint exam has been documented for this visit   There is currently no information documented on the homunculus. Go to the Rheumatology activity and complete the homunculus joint exam.  Investigation: No additional findings.  Imaging: MR PELVIS W WO CONTRAST  Result Date: 04/20/2020 CLINICAL DATA:  Right abdominal pain, status post uterine ablation, chronic right hydronephrosis, evaluate for uterine fibroids EXAM: MRI PELVIS WITHOUT AND WITH CONTRAST TECHNIQUE: Multiplanar multisequence MR imaging of the pelvis was performed both before and after administration of intravenous contrast. CONTRAST:  42mL MULTIHANCE GADOBENATE DIMEGLUMINE 529 MG/ML IV SOLN COMPARISON:  CT abdomen/pelvis dated 11/08/2019. Pelvic ultrasound dated 10/25/2019. FINDINGS: Urinary Tract: Moderate  right hydronephrosis on coronal haste imaging, improved from prior CT. Fullness of the left extrarenal pelvis without hydronephrosis. Bladder is within normal limits. Bowel:  Visualized bowel is unremarkable. Vascular/Lymphatic: No evidence of aneurysm. No suspicious pelvic lymphadenopathy. Reproductive: Retroverted uterus. Endometrium is indistinct status post endometrial ablation. Two small intramural fibroids measuring up to 13 mm in the posterior uterine body (series 3/image 12). Left ovary is notable for a 3.8 x 2.0 cm adnexal cyst (series 4/image 13), new/progressive, likely physiologic. Right ovary is within normal limits (series 4/image 11). Other:  Trace pelvic ascites (series 4/image 12), physiologic. Musculoskeletal: No focal osseous lesions. IMPRESSION: Retroverted uterus with two small intramural fibroids measuring up to 13 mm, as above. Endometrial complex is indistinct status post  endometrial ablation. 3.8 cm left adnexal cyst, new/progressive from prior CT, likely physiologic. No follow-up imaging recommended. Note: This recommendation does not apply to premenarchal patients and to those with increased risk (genetic, family history, elevated tumor markers or other high-risk factors) of ovarian cancer. Reference: JACR 2020 Feb; 17(2):248-254 Moderate right hydronephrosis, improved from prior CT. Electronically Signed   By: Julian Hy M.D.   On: 04/20/2020 09:08    Recent Labs: Lab Results  Component Value Date   WBC 5.4 04/08/2020   HGB 13.4 04/08/2020   PLT 237 04/08/2020   NA 136 04/08/2020   K 3.9 04/08/2020   CL 97 04/08/2020   CO2 23 04/08/2020   GLUCOSE 75 04/08/2020   BUN 19 04/08/2020   CREATININE 1.30 (H) 04/08/2020   BILITOT 0.5 04/08/2020   ALKPHOS 52 04/08/2020   AST 20 04/08/2020   ALT 14 04/08/2020   PROT 7.4 04/08/2020   ALBUMIN 4.3 04/08/2020   CALCIUM 9.3 04/08/2020   GFRAA 58 (L) 04/08/2020    Speciality Comments: No specialty comments available.  Procedures:  No procedures performed Allergies: Nsaids, Other, and Sulfa antibiotics   Assessment / Plan:     Visit Diagnoses: Pain in both hands -she has some discomfort in her left first PIP joint but no synovitis was noted.  No sclerodactyly or nailbed capillary changes were noted.    Primary osteoarthritis of both feet - mild.  Positive ANA (antinuclear antibody) - ANA 1: 80 speckled, ENA negative except RNP which was positive recently., C3-C4 normal patient had no clinical features of autoimmune disease. -She denies any history of oral ulcers, nasal ulcers, malar rash, Raynaud's phenomenon, lymphadenopathy or joint swelling.  I will obtain following labs today.  Plan: ANA, Anti-DNA antibody, double-stranded, C3 and C4, Sedimentation rate, RNP Antibody  Family history of rheumatoid arthritis - Mother  Family history of systemic lupus erythematosus - First cousin  Stage 3 chronic  kidney disease, unspecified whether stage 3a or 3b CKD ([place]) - Followed by Dr. Royce Macadamia.  History of gastritis  Mild intermittent asthma without complication  Benign hypertension with chronic kidney disease  Orders: Orders Placed This Encounter  Procedures  . ANA  . Anti-DNA antibody, double-stranded  . C3 and C4  . Sedimentation rate  . RNP Antibody   No orders of the defined types were placed in this encounter.     Follow-Up Instructions: Return in about 6 months (around 10/22/2020) for +ANA, +RNP .   Bo Merino, MD  Note - This record has been created using Editor, commissioning.  Chart creation errors have been sought, but may not always  have been located. Such creation errors do not reflect on  the standard of medical care.

## 2020-04-10 ENCOUNTER — Encounter: Payer: Self-pay | Admitting: Internal Medicine

## 2020-04-11 LAB — CMP14+EGFR
ALT: 14 IU/L (ref 0–32)
AST: 20 IU/L (ref 0–40)
Albumin/Globulin Ratio: 1.4 (ref 1.2–2.2)
Albumin: 4.3 g/dL (ref 3.8–4.8)
Alkaline Phosphatase: 52 IU/L (ref 44–121)
BUN/Creatinine Ratio: 15 (ref 9–23)
BUN: 19 mg/dL (ref 6–24)
Bilirubin Total: 0.5 mg/dL (ref 0.0–1.2)
CO2: 23 mmol/L (ref 20–29)
Calcium: 9.3 mg/dL (ref 8.7–10.2)
Chloride: 97 mmol/L (ref 96–106)
Creatinine, Ser: 1.3 mg/dL — ABNORMAL HIGH (ref 0.57–1.00)
GFR calc Af Amer: 58 mL/min/{1.73_m2} — ABNORMAL LOW (ref >59)
GFR calc non Af Amer: 51 mL/min/{1.73_m2} — ABNORMAL LOW (ref >59)
Globulin, Total: 3.1 g/dL (ref 1.5–4.5)
Glucose: 75 mg/dL (ref 65–99)
Potassium: 3.9 mmol/L (ref 3.5–5.2)
Sodium: 136 mmol/L (ref 134–144)
Total Protein: 7.4 g/dL (ref 6.0–8.5)

## 2020-04-11 LAB — ALLERGENS(96) FOODS
Allergen Apple, IgE: <0.1 kU/L
Allergen Banana IgE: <0.1 kU/L
Allergen Barley IgE: <0.1 kU/L
Allergen Black Pepper IgE: <0.1 kU/L
Allergen Blueberry IgE: <0.1 kU/L
Allergen Broccoli: <0.1 kU/L
Allergen Cabbage IgE: <0.1 kU/L
Allergen Carrot IgE: <0.1 kU/L
Allergen Cauliflower IgE: <0.1 kU/L
Allergen Celery IgE: <0.1 kU/L
Allergen Cinnamon IgE: <0.1 kU/L
Allergen Coconut IgE: <0.1 kU/L
Allergen Corn, IgE: <0.1 kU/L
Allergen Cucumber IgE: <0.1 kU/L
Allergen Garlic IgE: <0.1 kU/L
Allergen Ginger IgE: <0.1 kU/L
Allergen Gluten IgE: <0.1 kU/L
Allergen Grape IgE: <0.1 kU/L
Allergen Grapefruit IgE: <0.1 kU/L
Allergen Green Bean IgE: <0.1 kU/L
Allergen Green Pea IgE: <0.1 kU/L
Allergen Lamb IgE: <0.1 kU/L
Allergen Lettuce IgE: <0.1 kU/L
Allergen Lime IgE: <0.1 kU/L
Allergen Melon IgE: <0.1 kU/L
Allergen Oat IgE: <0.1 kU/L
Allergen Onion IgE: <0.1 kU/L
Allergen Pear IgE: <0.1 kU/L
Allergen Potato, White IgE: <0.1 kU/L
Allergen Rice IgE: <0.1 kU/L
Allergen Salmon IgE: <0.1 kU/L
Allergen Strawberry IgE: <0.1 kU/L
Allergen Sweet Potato IgE: <0.1 kU/L
Allergen Tomato, IgE: <0.1 kU/L
Allergen Turkey IgE: <0.1 kU/L
Allergen Watermelon IgE: <0.1 kU/L
Allergen, Peach f95: <0.1 kU/L
Basil: <0.1 kU/L
Beef IgE: <0.1 kU/L
C074-IgE Gelatin: <0.1 kU/L
Chicken IgE: <0.1 kU/L
Chocolate/Cacao IgE: <0.1 kU/L
Clam IgE: <0.1 kU/L
Codfish IgE: <0.1 kU/L
Coffee: <0.1 kU/L
Cranberry IgE: <0.1 kU/L
Egg White IgE: <0.1 kU/L
F020-IgE Almond: <0.1 kU/L
F023-IgE Crab: <0.1 kU/L
F045-IgE Yeast: <0.1 kU/L
F076-IgE Alpha Lactalbumin: <0.1 kU/L
F077-IgE Beta Lactoglobulin: <0.1 kU/L
F078-IgE Casein: <0.1 kU/L
F080-IgE Lobster: <0.1 kU/L
F081-IgE Cheese, Cheddar Type: <0.1 kU/L
F089-IgE Mustard: <0.1 kU/L
F096-IgE Avocado: <0.1 kU/L
F202-IgE Cashew Nut: <0.1 kU/L
F214-IgE Spinach: <0.1 kU/L
F222-IgE Tea: <0.1 kU/L
F242-IgE Bing Cherry: <0.1 kU/L
F247-IgE Honey: <0.1 kU/L
F261-IgE Asparagus: <0.1 kU/L
F262-IgE Eggplant: <0.1 kU/L
F265-IgE Cumin: <0.1 kU/L
F278-IgE Bayleaf (Laurel): <0.1 kU/L
F279-IgE Chili Pepper: <0.1 kU/L
F283-IgE Oregano: <0.1 kU/L
F300-IgE Goat's Milk: <0.1 kU/L
F342-IgE Olive, Black: <0.1 kU/L
F343-IgE Raspberry: <0.1 kU/L
Hops: <0.1 kU/L
IgE Egg (Yolk): <0.1 kU/L
Kidney Bean IgE: <0.1 kU/L
Lemon: <0.1 kU/L
Lima Bean IgE: <0.1 kU/L
Malt: <0.1 kU/L
Mushroom IgE: <0.1 kU/L
Orange: <0.1 kU/L
Paprika IgE: <0.1 kU/L
Peanut IgE: <0.1 kU/L
Pineapple IgE: <0.1 kU/L
Pork IgE: <0.1 kU/L
Pumpkin IgE: <0.1 kU/L
Red Beet: <0.1 kU/L
Rye IgE: <0.1 kU/L
Scallop IgE: <0.1 kU/L
Sesame Seed IgE: <0.1 kU/L
Shrimp IgE: <0.1 kU/L
Soybean IgE: <0.1 kU/L
Tuna: <0.1 kU/L
Vanilla: <0.1 kU/L
Walnut IgE: <0.1 kU/L
Wheat IgE: <0.1 kU/L
Whey: <0.1 kU/L
White Bean IgE: <0.1 kU/L

## 2020-04-11 LAB — CBC
Hematocrit: 40.5 % (ref 34.0–46.6)
Hemoglobin: 13.4 g/dL (ref 11.1–15.9)
MCH: 30.8 pg (ref 26.6–33.0)
MCHC: 33.1 g/dL (ref 31.5–35.7)
MCV: 93 fL (ref 79–97)
Platelets: 237 x10E3/uL (ref 150–450)
RBC: 4.35 x10E6/uL (ref 3.77–5.28)
RDW: 12.5 % (ref 11.7–15.4)
WBC: 5.4 x10E3/uL (ref 3.4–10.8)

## 2020-04-11 LAB — LIPID PANEL
Chol/HDL Ratio: 2.4 ratio (ref 0.0–4.4)
Cholesterol, Total: 183 mg/dL (ref 100–199)
HDL: 75 mg/dL (ref >39)
LDL Chol Calc (NIH): 88 mg/dL (ref 0–99)
Triglycerides: 116 mg/dL (ref 0–149)
VLDL Cholesterol Cal: 20 mg/dL (ref 5–40)

## 2020-04-11 LAB — HEPATITIS C ANTIBODY: Hep C Virus Ab: <0.1 s/co ratio (ref 0.0–0.9)

## 2020-04-20 ENCOUNTER — Other Ambulatory Visit: Payer: Self-pay

## 2020-04-20 ENCOUNTER — Ambulatory Visit
Admission: RE | Admit: 2020-04-20 | Discharge: 2020-04-20 | Disposition: A | Discharge status: Home / Self Care | Payer: BC Managed Care – PPO | Source: Ambulatory Visit | Attending: Obstetrics and Gynecology | Admitting: Obstetrics and Gynecology

## 2020-04-20 DIAGNOSIS — N1330 Unspecified hydronephrosis: Secondary | ICD-10-CM

## 2020-04-20 MED ORDER — GADOBENATE DIMEGLUMINE 529 MG/ML IV SOLN
13.0000 mL | Freq: Once | INTRAVENOUS | Status: AC | PRN
  Administered 2020-04-20: 09:00:00 13 mL via INTRAVENOUS

## 2020-04-22 ENCOUNTER — Encounter: Payer: Self-pay | Admitting: Internal Medicine

## 2020-04-23 ENCOUNTER — Encounter: Payer: Self-pay | Admitting: Rheumatology

## 2020-04-23 ENCOUNTER — Ambulatory Visit (INDEPENDENT_AMBULATORY_CARE_PROVIDER_SITE_OTHER): Payer: BC Managed Care – PPO | Admitting: Rheumatology

## 2020-04-23 ENCOUNTER — Other Ambulatory Visit: Payer: Self-pay

## 2020-04-23 VITALS — BP 129/73 | HR 61 | Ht 65.0 in | Wt 151.8 lb

## 2020-04-23 DIAGNOSIS — R768 Other specified abnormal immunological findings in serum: Secondary | ICD-10-CM

## 2020-04-23 DIAGNOSIS — J4520 Mild intermittent asthma, uncomplicated: Secondary | ICD-10-CM

## 2020-04-23 DIAGNOSIS — Z8269 Family history of other diseases of the musculoskeletal system and connective tissue: Secondary | ICD-10-CM

## 2020-04-23 DIAGNOSIS — M79641 Pain in right hand: Secondary | ICD-10-CM | POA: Diagnosis not present

## 2020-04-23 DIAGNOSIS — Z8719 Personal history of other diseases of the digestive system: Secondary | ICD-10-CM

## 2020-04-23 DIAGNOSIS — M19072 Primary osteoarthritis, left ankle and foot: Secondary | ICD-10-CM

## 2020-04-23 DIAGNOSIS — I129 Hypertensive chronic kidney disease with stage 1 through stage 4 chronic kidney disease, or unspecified chronic kidney disease: Secondary | ICD-10-CM

## 2020-04-23 DIAGNOSIS — M19071 Primary osteoarthritis, right ankle and foot: Secondary | ICD-10-CM

## 2020-04-23 DIAGNOSIS — M79642 Pain in left hand: Secondary | ICD-10-CM

## 2020-04-23 DIAGNOSIS — Z8261 Family history of arthritis: Secondary | ICD-10-CM

## 2020-04-23 DIAGNOSIS — N183 Chronic kidney disease, stage 3 unspecified: Secondary | ICD-10-CM

## 2020-04-23 NOTE — Patient Instructions (Addendum)
Hand Exercises Hand exercises can be helpful for almost anyone. These exercises can strengthen the hands, improve flexibility and movement, and increase blood flow to the hands. These results can make work and daily tasks easier. Hand exercises can be especially helpful for people who have joint pain from arthritis or have nerve damage from overuse (carpal tunnel syndrome). These exercises can also help people who have injured a hand. Exercises Most of these hand exercises are gentle stretching and motion exercises. It is usually safe to do them often throughout the day. Warming up your hands before exercise may help to reduce stiffness. You can do this with gentle massage or by placing your hands in warm water for 10-15 minutes. It is normal to feel some stretching, pulling, tightness, or mild discomfort as you begin new exercises. This will gradually improve. Stop an exercise right away if you feel sudden, severe pain or your pain gets worse. Ask your health care provider which exercises are best for you. Knuckle bend or "claw" fist 1. Stand or sit with your arm, hand, and all five fingers pointed straight up. Make sure to keep your wrist straight during the exercise. 2. Gently bend your fingers down toward your palm until the tips of your fingers are touching the top of your palm. Keep your big knuckle straight and just bend the small knuckles in your fingers. 3. Hold this position for __________ seconds. 4. Straighten (extend) your fingers back to the starting position. Repeat this exercise 5-10 times with each hand. Full finger fist 1. Stand or sit with your arm, hand, and all five fingers pointed straight up. Make sure to keep your wrist straight during the exercise. 2. Gently bend your fingers into your palm until the tips of your fingers are touching the middle of your palm. 3. Hold this position for __________ seconds. 4. Extend your fingers back to the starting position, stretching every  joint fully. Repeat this exercise 5-10 times with each hand. Straight fist 1. Stand or sit with your arm, hand, and all five fingers pointed straight up. Make sure to keep your wrist straight during the exercise. 2. Gently bend your fingers at the big knuckle, where your fingers meet your hand, and the middle knuckle. Keep the knuckle at the tips of your fingers straight and try to touch the bottom of your palm. 3. Hold this position for __________ seconds. 4. Extend your fingers back to the starting position, stretching every joint fully. Repeat this exercise 5-10 times with each hand. Tabletop 1. Stand or sit with your arm, hand, and all five fingers pointed straight up. Make sure to keep your wrist straight during the exercise. 2. Gently bend your fingers at the big knuckle, where your fingers meet your hand, as far down as you can while keeping the small knuckles in your fingers straight. Think of forming a tabletop with your fingers. 3. Hold this position for __________ seconds. 4. Extend your fingers back to the starting position, stretching every joint fully. Repeat this exercise 5-10 times with each hand. Finger spread 1. Place your hand flat on a table with your palm facing down. Make sure your wrist stays straight as you do this exercise. 2. Spread your fingers and thumb apart from each other as far as you can until you feel a gentle stretch. Hold this position for __________ seconds. 3. Bring your fingers and thumb tight together again. Hold this position for __________ seconds. Repeat this exercise 5-10 times with each hand.   Making circles 1. Stand or sit with your arm, hand, and all five fingers pointed straight up. Make sure to keep your wrist straight during the exercise. 2. Make a circle by touching the tip of your thumb to the tip of your index finger. 3. Hold for __________ seconds. Then open your hand wide. 4. Repeat this motion with your thumb and each finger on your  hand. Repeat this exercise 5-10 times with each hand. Thumb motion 1. Sit with your forearm resting on a table and your wrist straight. Your thumb should be facing up toward the ceiling. Keep your fingers relaxed as you move your thumb. 2. Lift your thumb up as high as you can toward the ceiling. Hold for __________ seconds. 3. Bend your thumb across your palm as far as you can, reaching the tip of your thumb for the small finger (pinkie) side of your palm. Hold for __________ seconds. Repeat this exercise 5-10 times with each hand. Grip strengthening  1. Hold a stress ball or other soft ball in the middle of your hand. 2. Slowly increase the pressure, squeezing the ball as much as you can without causing pain. Think of bringing the tips of your fingers into the middle of your palm. All of your finger joints should bend when doing this exercise. 3. Hold your squeeze for __________ seconds, then relax. Repeat this exercise 5-10 times with each hand. Contact a health care provider if:  Your hand pain or discomfort gets much worse when you do an exercise.  Your hand pain or discomfort does not improve within 2 hours after you exercise. If you have any of these problems, stop doing these exercises right away. Do not do them again unless your health care provider says that you can. Get help right away if:  You develop sudden, severe hand pain or swelling. If this happens, stop doing these exercises right away. Do not do them again unless your health care provider says that you can. This information is not intended to replace advice given to you by your health care provider. Make sure you discuss any questions you have with your health care provider. Document Revised: 08/16/2018 Document Reviewed: 04/26/2018 Elsevier Patient Education  2020 [employer]  Journal for Nurse Practitioners, 15(4), 263-267. Retrieved February 12, 2018 from http://clinicalkey.com/nursing">  Knee Exercises Ask your  health care provider which exercises are safe for you. Do exercises exactly as told by your health care provider and adjust them as directed. It is normal to feel mild stretching, pulling, tightness, or discomfort as you do these exercises. Stop right away if you feel sudden pain or your pain gets worse. Do not begin these exercises until told by your health care provider. Stretching and range-of-motion exercises These exercises warm up your muscles and joints and improve the movement and flexibility of your knee. These exercises also help to relieve pain and swelling. Knee extension, prone 1. Lie on your abdomen (prone position) on a bed. 2. Place your left / right knee just beyond the edge of the surface so your knee is not on the bed. You can put a towel under your left / right thigh just above your kneecap for comfort. 3. Relax your leg muscles and allow gravity to straighten your knee (extension). You should feel a stretch behind your left / right knee. 4. Hold this position for __________ seconds. 5. Scoot up so your knee is supported between repetitions. Repeat __________ times. Complete this exercise __________ times a day.   Knee flexion, active  1. Lie on your back with both legs straight. If this causes back discomfort, bend your left / right knee so your foot is flat on the floor. 2. Slowly slide your left / right heel back toward your buttocks. Stop when you feel a gentle stretch in the front of your knee or thigh (flexion). 3. Hold this position for __________ seconds. 4. Slowly slide your left / right heel back to the starting position. Repeat __________ times. Complete this exercise __________ times a day. Quadriceps stretch, prone  1. Lie on your abdomen on a firm surface, such as a bed or padded floor. 2. Bend your left / right knee and hold your ankle. If you cannot reach your ankle or pant leg, loop a belt around your foot and grab the belt instead. 3. Gently pull your heel  toward your buttocks. Your knee should not slide out to the side. You should feel a stretch in the front of your thigh and knee (quadriceps). 4. Hold this position for __________ seconds. Repeat __________ times. Complete this exercise __________ times a day. Hamstring, supine 1. Lie on your back (supine position). 2. Loop a belt or towel over the ball of your left / right foot. The ball of your foot is on the walking surface, right under your toes. 3. Straighten your left / right knee and slowly pull on the belt to raise your leg until you feel a gentle stretch behind your knee (hamstring). ? Do not let your knee bend while you do this. ? Keep your other leg flat on the floor. 4. Hold this position for __________ seconds. Repeat __________ times. Complete this exercise __________ times a day. Strengthening exercises These exercises build strength and endurance in your knee. Endurance is the ability to use your muscles for a long time, even after they get tired. Quadriceps, isometric This exercise stretches the muscles in front of your thigh (quadriceps) without moving your knee joint (isometric). 1. Lie on your back with your left / right leg extended and your other knee bent. Put a rolled towel or small pillow under your knee if told by your health care provider. 2. Slowly tense the muscles in the front of your left / right thigh. You should see your kneecap slide up toward your hip or see increased dimpling just above the knee. This motion will push the back of the knee toward the floor. 3. For __________ seconds, hold the muscle as tight as you can without increasing your pain. 4. Relax the muscles slowly and completely. Repeat __________ times. Complete this exercise __________ times a day. Straight leg raises This exercise stretches the muscles in front of your thigh (quadriceps) and the muscles that move your hips (hip flexors). 1. Lie on your back with your left / right leg extended and  your other knee bent. 2. Tense the muscles in the front of your left / right thigh. You should see your kneecap slide up or see increased dimpling just above the knee. Your thigh may even shake a bit. 3. Keep these muscles tight as you raise your leg 4-6 inches (10-15 cm) off the floor. Do not let your knee bend. 4. Hold this position for __________ seconds. 5. Keep these muscles tense as you lower your leg. 6. Relax your muscles slowly and completely after each repetition. Repeat __________ times. Complete this exercise __________ times a day. Hamstring, isometric 1. Lie on your back on a firm surface. 2. Bend your   left / right knee about __________ degrees. 3. Dig your left / right heel into the surface as if you are trying to pull it toward your buttocks. Tighten the muscles in the back of your thighs (hamstring) to "dig" as hard as you can without increasing any pain. 4. Hold this position for __________ seconds. 5. Release the tension gradually and allow your muscles to relax completely for __________ seconds after each repetition. Repeat __________ times. Complete this exercise __________ times a day. Hamstring curls If told by your health care provider, do this exercise while wearing ankle weights. Begin with __________ lb weights. Then increase the weight by 1 lb (0.5 kg) increments. Do not wear ankle weights that are more than __________ lb. 1. Lie on your abdomen with your legs straight. 2. Bend your left / right knee as far as you can without feeling pain. Keep your hips flat against the floor. 3. Hold this position for __________ seconds. 4. Slowly lower your leg to the starting position. Repeat __________ times. Complete this exercise __________ times a day. Squats This exercise strengthens the muscles in front of your thigh and knee (quadriceps). 1. Stand in front of a table, with your feet and knees pointing straight ahead. You may rest your hands on the table for balance but  not for support. 2. Slowly bend your knees and lower your hips like you are going to sit in a chair. ? Keep your weight over your heels, not over your toes. ? Keep your lower legs upright so they are parallel with the table legs. ? Do not let your hips go lower than your knees. ? Do not bend lower than told by your health care provider. ? If your knee pain increases, do not bend as low. 3. Hold the squat position for __________ seconds. 4. Slowly push with your legs to return to standing. Do not use your hands to pull yourself to standing. Repeat __________ times. Complete this exercise __________ times a day. Wall slides This exercise strengthens the muscles in front of your thigh and knee (quadriceps). 1. Lean your back against a smooth wall or door, and walk your feet out 18-24 inches (46-61 cm) from it. 2. Place your feet hip-width apart. 3. Slowly slide down the wall or door until your knees bend __________ degrees. Keep your knees over your heels, not over your toes. Keep your knees in line with your hips. 4. Hold this position for __________ seconds. Repeat __________ times. Complete this exercise __________ times a day. Straight leg raises This exercise strengthens the muscles that rotate the leg at the hip and move it away from your body (hip abductors). 1. Lie on your side with your left / right leg in the top position. Lie so your head, shoulder, knee, and hip line up. You may bend your bottom knee to help you keep your balance. 2. Roll your hips slightly forward so your hips are stacked directly over each other and your left / right knee is facing forward. 3. Leading with your heel, lift your top leg 4-6 inches (10-15 cm). You should feel the muscles in your outer hip lifting. ? Do not let your foot drift forward. ? Do not let your knee roll toward the ceiling. 4. Hold this position for __________ seconds. 5. Slowly return your leg to the starting position. 6. Let your muscles  relax completely after each repetition. Repeat __________ times. Complete this exercise __________ times a day. Straight leg raises This exercise   stretches the muscles that move your hips away from the front of the pelvis (hip extensors). 1. Lie on your abdomen on a firm surface. You can put a pillow under your hips if that is more comfortable. 2. Tense the muscles in your buttocks and lift your left / right leg about 4-6 inches (10-15 cm). Keep your knee straight as you lift your leg. 3. Hold this position for __________ seconds. 4. Slowly lower your leg to the starting position. 5. Let your leg relax completely after each repetition. Repeat __________ times. Complete this exercise __________ times a day. This information is not intended to replace advice given to you by your health care provider. Make sure you discuss any questions you have with your health care provider. Document Revised: 02/13/2018 Document Reviewed: 02/13/2018 Elsevier Patient Education  2020 Else[employer] Inc.  

## 2020-04-25 LAB — RNP ANTIBODY: Ribonucleic Protein(ENA) Antibody, IgG: 1.2 AI — AB

## 2020-04-25 LAB — C3 AND C4
C3 Complement: 127 mg/dL (ref 83–193)
C4 Complement: 30 mg/dL (ref 15–57)

## 2020-04-25 LAB — SEDIMENTATION RATE: Sed Rate: 9 mm/h (ref 0–20)

## 2020-04-25 LAB — ANA: Anti Nuclear Antibody (ANA): POSITIVE — AB

## 2020-04-25 LAB — ANTI-NUCLEAR AB-TITER (ANA TITER): ANA Titer 1: 1:80 {titer} — ABNORMAL HIGH

## 2020-04-25 LAB — ANTI-DNA ANTIBODY, DOUBLE-STRANDED: ds DNA Ab: 1 IU/mL

## 2020-04-26 NOTE — Progress Notes (Signed)
+  ANA, +RNP, all other labs are normal. Latissa did not have any clinical features of the active disease at the last visit. We will re-evaluate in 6 months as scheduled.

## 2020-10-12 ENCOUNTER — Ambulatory Visit: Payer: BC Managed Care – PPO | Admitting: Internal Medicine

## 2020-10-12 ENCOUNTER — Encounter: Payer: Self-pay | Admitting: Internal Medicine

## 2020-10-12 ENCOUNTER — Other Ambulatory Visit: Payer: Self-pay

## 2020-10-12 VITALS — BP 126/82 | HR 61 | Temp 97.4°F | Ht 65.0 in | Wt 152.4 lb

## 2020-10-12 DIAGNOSIS — N1831 Chronic kidney disease, stage 3a: Secondary | ICD-10-CM

## 2020-10-12 DIAGNOSIS — M3500 Sicca syndrome, unspecified: Secondary | ICD-10-CM

## 2020-10-12 DIAGNOSIS — H1131 Conjunctival hemorrhage, right eye: Secondary | ICD-10-CM

## 2020-10-12 DIAGNOSIS — I129 Hypertensive chronic kidney disease with stage 1 through stage 4 chronic kidney disease, or unspecified chronic kidney disease: Secondary | ICD-10-CM | POA: Diagnosis not present

## 2020-10-12 DIAGNOSIS — M545 Low back pain, unspecified: Secondary | ICD-10-CM

## 2020-10-12 DIAGNOSIS — J4520 Mild intermittent asthma, uncomplicated: Secondary | ICD-10-CM

## 2020-10-12 DIAGNOSIS — Z6825 Body mass index (BMI) 25.0-25.9, adult: Secondary | ICD-10-CM

## 2020-10-12 MED ORDER — BREO ELLIPTA 200-25 MCG/INH IN AEPB: 1.0000 | INHALATION_SPRAY | Freq: Every day | RESPIRATORY_TRACT | 5 refills | Status: DC

## 2020-10-12 MED ORDER — CYCLOBENZAPRINE HCL 5 MG PO TABS: 5.0000 mg | ORAL_TABLET | Freq: Three times a day (TID) | ORAL | 0 refills | Status: DC | PRN

## 2020-10-12 MED ORDER — ALBUTEROL SULFATE HFA 108 (90 BASE) MCG/ACT IN AERS: 2.0000 | INHALATION_SPRAY | Freq: Four times a day (QID) | RESPIRATORY_TRACT | 11 refills | Status: DC | PRN

## 2020-10-12 MED ORDER — CYCLOBENZAPRINE HCL 5 MG PO TABS: ORAL_TABLET | ORAL | 0 refills | Status: DC

## 2020-10-12 MED ORDER — METOPROLOL SUCCINATE ER 50 MG PO TB24: 50.0000 mg | ORAL_TABLET | Freq: Every day | ORAL | 2 refills | Status: DC

## 2020-10-12 NOTE — Progress Notes (Signed)
I,[name],acting as a Education administrator for Maximino Greenland, MD.,have documented all relevant documentation on the behalf of Maximino Greenland, MD,as directed by  Maximino Greenland, MD while in the presence of Maximino Greenland, MD.  This visit occurred during the SARS-CoV-2 public health emergency.  Safety protocols were in place, including screening questions prior to the visit, additional usage of staff PPE, and extensive cleaning of exam room while observing appropriate contact time as indicated for disinfecting solutions.  Subjective:     Patient ID: Shelby Campbell , female    DOB: Jun 30, 1975 , 45 y.o.   MRN: [id number]   Chief Complaint  Patient presents with  . Hypertension    HPI  She presents today for bp check. She reports compliance with meds. Denies headaches, chest pain and shortness of breath.   Hypertension This is a chronic problem. The current episode started more than 1 year ago. The problem has been gradually improving since onset. The problem is controlled. Pertinent negatives include no blurred vision, chest pain, palpitations or shortness of breath. Past treatments include ACE inhibitors. The current treatment provides moderate improvement. Hypertensive end-organ damage includes kidney disease.     Past Medical History:  Diagnosis Date  . Asthma   . Chronic kidney disease    stage 3 a per lov dr Cecille Rubin foster 10-01-2019 on chart  . Hiatal hernia   . History of mitral valve prolapse    no problems since birth of son 56 yrs ago (2008)  . Hydronephrosis 2021   Per patient  . Hypertension   . 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 Shortness of breath    related to exertion     Family History  Problem Relation Age of Onset  . Hypertension Mother   . Arthritis Mother   . Hypertension Father   . Prostate cancer Father   . Arthritis Father   . Gout Father   . Healthy Son   . Healthy Daughter      Current Outpatient Medications:  .  acetaminophen (TYLENOL) 500  MG tablet, Take 1,000 mg by mouth 2 (two) times daily as needed for headache (or flank pain)., Disp: , Rfl:  .  amLODipine (NORVASC) 2.5 MG tablet, Take 2.5 mg by mouth daily., Disp: , Rfl:  .  cholecalciferol (VITAMIN D3) 25 MCG (1000 UNIT) tablet, Take 1,000 Units by mouth daily. , Disp: , Rfl:  .  cyclobenzaprine (FLEXERIL) 5 MG tablet, One tab po qpm prn, Disp: 30 tablet, Rfl: 0 .  Multiple Vitamin (MULTIVITAMIN) tablet, Take 1 tablet by mouth daily., Disp: , Rfl:  .  albuterol (VENTOLIN HFA) 108 (90 Base) MCG/ACT inhaler, Inhale 2 puffs into the lungs every 6 (six) hours as needed for wheezing or shortness of breath., Disp: 18 g, Rfl: 11 .  fluticasone furoate-vilanterol (BREO ELLIPTA) 200-25 MCG/INH AEPB, Inhale 1 puff into the lungs daily., Disp: 1 each, Rfl: 5 .  metoprolol succinate (TOPROL-XL) 50 MG 24 hr tablet, Take 1 tablet (50 mg total) by mouth daily., Disp: 90 tablet, Rfl: 2   Allergies  Allergen Reactions  . Nsaids     And ibuprofen cannot take due to chronic kidney disease  . Other Other (See Comments)    Sulfonamides, sulfonylureas, barbiturates, antifungals, ketamine, etomidate, rifapentine, rifampicin, rifabutine, nitrofurantoin, metronidazole, ergot derivatives, indinavir, nevirapine, ritonavir, saquinavir, progestogens, progesterones, carbamazepine, phenytoin, valproate, oxycodone, pentazocine, cocaine, gold antirheumatics agents, sodium aurothiomalate, methyldopa, fenfluramine, ethosuzimide, flupentixol, flutamide, disulfiram, lidocaine   .  Sulfa Antibiotics Other (See Comments)    Heart beating fast      Review of Systems  Constitutional: Negative.   HENT:       States she "busted blood vessel" in her eye. Not sure what precipitated sx. Someone notified her of redness in her eye  Eyes: Negative for blurred vision.  Respiratory: Negative.  Negative for shortness of breath.   Cardiovascular: Negative.  Negative for chest pain and palpitations.  Gastrointestinal:  Negative.   Musculoskeletal: Positive for back pain.       She c/o right sided back pain. Started about a month ago. Described as a dull, throbbing pain. Sometimes sharp/shooting. She is followed by chiropractor.   Psychiatric/Behavioral: Negative.   All other systems reviewed and are negative.    Today's Vitals   10/12/20 0842  BP: 126/82  Pulse: 61  Temp: (!) 97.4 F (36.3 C)  TempSrc: Oral  Weight: 152 lb 6.4 oz (69.1 kg)  Height: 5\' 5"  (1.651 m)   Body mass index is 25.36 kg/m.  Wt Readings from Last 3 Encounters:  10/12/20 152 lb 6.4 oz (69.1 kg)  04/23/20 151 lb 12.8 oz (68.9 kg)  04/08/20 146 lb (66.2 kg)   BP Readings from Last 3 Encounters:  10/12/20 126/82  04/23/20 129/73  04/08/20 116/84   Objective:  Physical Exam Vitals and nursing note reviewed.  Constitutional:      Appearance: Normal appearance.  HENT:     Head: Normocephalic and atraumatic.     Nose:     Comments: Masked     Mouth/Throat:     Comments: Masked  Eyes:     Extraocular Movements: Extraocular movements intact.     Comments: Erythema right eye  Cardiovascular:     Rate and Rhythm: Normal rate and regular rhythm.     Heart sounds: Normal heart sounds.  Pulmonary:     Effort: Pulmonary effort is normal.     Breath sounds: Normal breath sounds.  Musculoskeletal:     Cervical back: Normal range of motion.  Skin:    General: Skin is warm.  Neurological:     General: No focal deficit present.     Mental Status: She is alert.  Psychiatric:        Mood and Affect: Mood normal.        Behavior: Behavior normal.         Assessment And Plan:     1. Benign hypertension with chronic kidney disease Comments: Chronic, well controlled. She will continue with current meds -amlodipine/metoprolol. She is encouraged to avoid adding salt to her foods. - metoprolol succinate (TOPROL-XL) 50 MG 24 hr tablet; Take 1 tablet (50 mg total) by mouth daily.  Dispense: 90 tablet; Refill: 2  2. Stage  3a chronic kidney disease ([place]) Comments: Chronic, had recent evaluation by Nephrology. Will not check renal function today.  3. Acute right-sided low back pain without sciatica Comments: WE discussed performing stretching exercises, including Figure 4. I will send rx cyclobenzaprine to take nightly prn. She is reminded that constipation can exacerbate her sx. Encouraged to increase water intake and daily activity as tolerated.   4. Subconjunctival hemorrhage of right eye Comments: Resolving.   5. Mild intermittent asthma without complication Comments: Chronic, she denies having exacerbation since last visit.  She was given refill on both Breo and albuterol MDIs.  6. Sicca syndrome ([place]) Comments: AS per Rheum, she is encouraged to keep upcoming appt. Also encouraged to increase her water  intake.   7. Body mass index (BMI) of 25.0 to 25.9 in adult Comments: She is encouraged to aim for at least 150 minutes of exercise per week.    Patient was given opportunity to ask questions. Patient verbalized understanding of the plan and was able to repeat key elements of the plan. All questions were answered to their satisfaction.   I, Maximino Greenland, MD, have reviewed all documentation for this visit. The documentation on 10/12/20 for the exam, diagnosis, procedures, and orders are all accurate and complete.   IF YOU HAVE BEEN REFERRED TO A SPECIALIST, IT MAY TAKE 1-2 WEEKS TO SCHEDULE/PROCESS THE REFERRAL. IF YOU HAVE NOT HEARD FROM US/SPECIALIST IN TWO WEEKS, PLEASE GIVE [place] A CALL AT [phone] X [phone].   THE PATIENT IS ENCOURAGED TO PRACTICE SOCIAL DISTANCING DUE TO THE COVID-19 PANDEMIC.

## 2020-10-12 NOTE — Patient Instructions (Signed)

## 2020-10-15 NOTE — Progress Notes (Signed)
Office Visit Note  Patient: Shelby Campbell             Date of Birth: [birth year]           MRN: [id number]             PCP: [name], MD Referring: [name], MD Visit Date: 10/22/2020 Occupation: @GUAROCC @  Subjective:  Other (Numbness in right hand. )   History of Present Illness: Shelby Campbell is a 45 y.o. female with history of positive ANA and positive RNP.  She denies any history of oral ulcers, nasal ulcers, malar rash, photosensitivity, sicca symptoms, Raynaud's phenomenon or lymphadenopathy.  She continues to have some stiffness in her hands.  She states recently has been having numbness in her bilateral hands more prominent in the right hand.  She is on computer several hours a day.  She denies any nocturnal pain.  Activities of Daily Living:  Patient reports morning stiffness for 0 minutes.   Patient Denies nocturnal pain.  Difficulty dressing/grooming: Denies Difficulty climbing stairs: Denies Difficulty getting out of chair: Denies Difficulty using hands for taps, buttons, cutlery, and/or writing: Denies  Review of Systems  Constitutional:  Negative for fatigue.  HENT:  Negative for mouth sores, mouth dryness and nose dryness.   Eyes:  Negative for pain, itching and dryness.  Respiratory:  Negative for shortness of breath and difficulty breathing.   Cardiovascular:  Negative for chest pain and palpitations.  Gastrointestinal:  Negative for blood in stool, constipation and diarrhea.  Endocrine: Negative for increased urination.  Genitourinary:  Negative for difficulty urinating.  Musculoskeletal:  Negative for joint pain, joint pain, joint swelling, myalgias, morning stiffness, muscle tenderness and myalgias.  Skin:  Negative for color change, rash and redness.  Allergic/Immunologic: Negative for susceptible to infections.  Neurological:  Positive for numbness and headaches. Negative for dizziness, memory loss and weakness.  Hematological:  Positive for  bruising/bleeding tendency.  Psychiatric/Behavioral:  Negative for confusion.    PMFS History:  Patient Active Problem List   Diagnosis Date Noted   Sicca syndrome ([place]) 10/12/2020   Body mass index (BMI) of 25.0 to 25.9 in adult 10/12/2020   Subconjunctival hemorrhage of right eye 10/12/2020   Chronic renal disease, stage III ([place]) 04/11/2018   Asthma 03/01/2018   Benign hypertension with chronic kidney disease 02/26/2018   Chronic gastritis, mild.  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[place])    Shortness of breath    related to exertion    Family History  Problem Relation Age of Onset   Hypertension Mother    Arthritis Mother    Hypertension Father    Prostate cancer Father    Arthritis Father    Gout Father    Healthy Son    Healthy Daughter    Past Surgical History:  Procedure Laterality Date   AXILLARY LYMPH NODE BIOPSY  2010   right    CHOLECYSTECTOMY     CYSTOSCOPY WITH RETROGRADE PYELOGRAM, URETEROSCOPY AND STENT PLACEMENT Right 10/15/2019  Procedure: CYSTOSCOPY WITH RETROGRADE PYELOGRAM, URETEROSCOPY AND STENT PLACEMENT, attempted laser of stricture;  Surgeon: Robley Fries, MD;  Location: [facility];  Service: Urology;  Laterality: Right;  1 HR   DILITATION & CURRETTAGE/HYSTROSCOPY WITH NOVASURE ABLATION N/A 05/04/2016   Procedure: DILATATION & CURETTAGE/HYSTEROSCOPY WITH NOVASURE ABLATION;  Surgeon: Servando Salina, MD;  Location: [facility];  Service: Gynecology;  Laterality: N/A;   LAPAROSCOPIC CHOLECYSTECTOMY SINGLE PORT N/A 09/10/2013   Procedure: LAPAROSCOPIC CHOLECYSTECTOMY SINGLE  SITE WITH CHOLANGIOGRAM;  Surgeon: Adin Hector, MD;  Location: [facility];  Service: General;  Laterality: N/A;   TUBAL LIGATION     Social History   Social History Narrative   Not on file   Immunization History  Administered Date(s) Administered   Influenza,inj,Quad PF,6+ Mos 03/01/2018, 04/08/2020   Janssen (J&J) SARS-COV-2 Vaccination 08/08/2019   Moderna Sars-Covid-2 Vaccination 04/01/2020   Td 04/12/2003   Tdap 12/20/2010     Objective: Vital Signs: BP (!) 139/93 (BP Location: Left Arm, Patient Position: Sitting, Cuff Size: Normal)   Pulse 67   Resp 14   Ht 5\' 5"  (1.651 m)   Wt 154 lb 6.4 oz (70 kg)   BMI 25.69 kg/m    Physical Exam Vitals and nursing note reviewed.  Constitutional:      Appearance: She is well-developed.  HENT:     Head: Normocephalic and atraumatic.  Eyes:     Conjunctiva/sclera: Conjunctivae normal.  Cardiovascular:     Rate and Rhythm: Normal rate and regular rhythm.     Heart sounds: Normal heart sounds.  Pulmonary:     Effort: Pulmonary effort is normal.     Breath sounds: Normal breath sounds.  Abdominal:     General: Bowel sounds are normal.     Palpations: Abdomen is soft.  Musculoskeletal:     Cervical back: Normal range of motion.  Lymphadenopathy:     Cervical: No cervical adenopathy.  Skin:    General: Skin is warm and dry.     Capillary Refill: Capillary refill takes less than 2 seconds.  Neurological:     Mental Status: She is alert and oriented to person, place, and time.  Psychiatric:        Behavior: Behavior normal.     Musculoskeletal Exam: C-spine thoracic and lumbar spine were in good range of motion.  Shoulder joints, elbow joints, wrist joints, MCPs PIPs and DIPs with good range of motion with no synovitis.  Hip joints, knee joints, ankles, MTPs and PIPs with good range of motion with no synovitis.  CDAI Exam: CDAI Score: -- Patient Global: --; Provider Global: -- Swollen: --; Tender: -- Joint Exam 10/22/2020    No joint exam has been documented for this visit   There is currently no information documented on the homunculus. Go to the Rheumatology activity and complete the homunculus joint exam.  Investigation: No additional findings.  Imaging: No results found.  Recent Labs: Lab Results  Component Value Date   WBC 5.4 04/08/2020   HGB 13.4 04/08/2020   PLT 237 04/08/2020   NA 136 04/08/2020   K 3.9 04/08/2020   CL 97 04/08/2020   CO2 23 04/08/2020   GLUCOSE 75 04/08/2020   BUN 19 04/08/2020   CREATININE 1.30 (H) 04/08/2020   BILITOT 0.5 04/08/2020   ALKPHOS 52 04/08/2020   AST 20 04/08/2020   ALT 14 04/08/2020   PROT 7.4 04/08/2020   ALBUMIN 4.3 04/08/2020   CALCIUM 9.3 04/08/2020   GFRAA 58 (  L) 04/08/2020    Speciality Comments: No specialty comments available.  Procedures:  No procedures performed Allergies: Nsaids, Other, and Sulfa antibiotics   Assessment / Plan:     Visit Diagnoses: Pain in both hands-she had no synovitis in her hands.  X-rays of her hands in August 2020 were unremarkable.  She has off-and-on stiffness in her hands.  Recently she has been experiencing some numbness in her hands.  She works on the computer for several hours.  I believe she has early symptoms of carpal tunnel syndrome.  Her prescription for right carpal tunnel syndrome was given.  I discussed possible nerve conduction velocities in the future if the symptoms persist.  Primary osteoarthritis of both feet - mild.  Currently asymptomatic.  Positive ANA (antinuclear antibody) - ANA 1: 80 speckled, ENA negative except RNP which was positive recently., C3-C4 normal patient had no clinical features of autoimmune disease.  She has no clinical features of autoimmune disease.  I will obtain labs today.  We will contact her with the lab results once available.  Family history of rheumatoid arthritis - Mother  Family history of systemic lupus erythematosus - First cousin  Stage 3 chronic kidney  disease, unspecified whether stage 3a or 3b CKD (HCC)-she is followed by nephrology.  She states she had recent UA which was negative.  She will forward the lab results to [place].  Mild intermittent asthma without complication  History of gastritis  Benign hypertension with chronic kidney disease-her blood pressure is elevated.  Have advised her to monitor blood pressure closely.  Orders: Orders Placed This Encounter  Procedures   CBC with Differential/Platelet   COMPLETE METABOLIC PANEL WITH GFR   C3 and C4   Anti-DNA antibody, double-stranded   RNP Antibody   Sedimentation rate    No orders of the defined types were placed in this encounter.    Follow-Up Instructions: Return in about 6 months (around 04/23/2021) for +ANA.   Bo Merino, MD  Note - This record has been created using Editor, commissioning.  Chart creation errors have been sought, but may not always  have been located. Such creation errors do not reflect on  the standard of medical care.

## 2020-10-22 ENCOUNTER — Ambulatory Visit: Payer: BC Managed Care – PPO | Admitting: Rheumatology

## 2020-10-22 ENCOUNTER — Encounter: Payer: Self-pay | Admitting: Rheumatology

## 2020-10-22 ENCOUNTER — Other Ambulatory Visit: Payer: Self-pay

## 2020-10-22 VITALS — BP 139/93 | HR 67 | Resp 14 | Ht 65.0 in | Wt 154.4 lb

## 2020-10-22 DIAGNOSIS — N183 Chronic kidney disease, stage 3 unspecified: Secondary | ICD-10-CM

## 2020-10-22 DIAGNOSIS — Z8269 Family history of other diseases of the musculoskeletal system and connective tissue: Secondary | ICD-10-CM

## 2020-10-22 DIAGNOSIS — Z8719 Personal history of other diseases of the digestive system: Secondary | ICD-10-CM

## 2020-10-22 DIAGNOSIS — M79641 Pain in right hand: Secondary | ICD-10-CM | POA: Diagnosis not present

## 2020-10-22 DIAGNOSIS — M79642 Pain in left hand: Secondary | ICD-10-CM

## 2020-10-22 DIAGNOSIS — M19071 Primary osteoarthritis, right ankle and foot: Secondary | ICD-10-CM

## 2020-10-22 DIAGNOSIS — R768 Other specified abnormal immunological findings in serum: Secondary | ICD-10-CM

## 2020-10-22 DIAGNOSIS — I129 Hypertensive chronic kidney disease with stage 1 through stage 4 chronic kidney disease, or unspecified chronic kidney disease: Secondary | ICD-10-CM

## 2020-10-22 DIAGNOSIS — Z8261 Family history of arthritis: Secondary | ICD-10-CM | POA: Diagnosis not present

## 2020-10-22 DIAGNOSIS — R7689 Other specified abnormal immunological findings in serum: Secondary | ICD-10-CM

## 2020-10-22 DIAGNOSIS — J4520 Mild intermittent asthma, uncomplicated: Secondary | ICD-10-CM

## 2020-10-22 DIAGNOSIS — M19072 Primary osteoarthritis, left ankle and foot: Secondary | ICD-10-CM

## 2020-10-23 LAB — COMPLETE METABOLIC PANEL WITH GFR
AG Ratio: 1.7 (calc) (ref 1.0–2.5)
ALT: 14 U/L (ref 6–29)
AST: 19 U/L (ref 10–30)
Albumin: 4.3 g/dL (ref 3.6–5.1)
Alkaline phosphatase (APISO): 48 U/L (ref 31–125)
BUN/Creatinine Ratio: 12 (calc) (ref 6–22)
BUN: 21 mg/dL (ref 7–25)
CO2: 28 mmol/L (ref 20–32)
Calcium: 9.5 mg/dL (ref 8.6–10.2)
Chloride: 100 mmol/L (ref 98–110)
Creat: 1.7 mg/dL — ABNORMAL HIGH (ref 0.50–1.10)
GFR, Est African American: 42 mL/min/{1.73_m2} — ABNORMAL LOW (ref >=60)
GFR, Est Non African American: 36 mL/min/{1.73_m2} — ABNORMAL LOW (ref >=60)
Globulin: 2.6 g/dL (calc) (ref 1.9–3.7)
Glucose, Bld: 123 mg/dL — ABNORMAL HIGH (ref 65–99)
Potassium: 3.7 mmol/L (ref 3.5–5.3)
Sodium: 137 mmol/L (ref 135–146)
Total Bilirubin: 0.5 mg/dL (ref 0.2–1.2)
Total Protein: 6.9 g/dL (ref 6.1–8.1)

## 2020-10-23 LAB — SEDIMENTATION RATE: Sed Rate: 6 mm/h (ref 0–20)

## 2020-10-23 LAB — ANTI-DNA ANTIBODY, DOUBLE-STRANDED: ds DNA Ab: <1 IU/mL

## 2020-10-23 LAB — CBC WITH DIFFERENTIAL/PLATELET
Absolute Monocytes: 229 cells/uL (ref 200–950)
Basophils Absolute: 22 cells/uL (ref 0–200)
Basophils Relative: 0.5 %
Eosinophils Absolute: 101 cells/uL (ref 15–500)
Eosinophils Relative: 2.3 %
HCT: 37 % (ref 35.0–45.0)
Hemoglobin: 12.4 g/dL (ref 11.7–15.5)
Lymphs Abs: 1188 cells/uL (ref 850–3900)
MCH: 30.9 pg (ref 27.0–33.0)
MCHC: 33.5 g/dL (ref 32.0–36.0)
MCV: 92.3 fL (ref 80.0–100.0)
MPV: 10.4 fL (ref 7.5–12.5)
Monocytes Relative: 5.2 %
Neutro Abs: 2860 cells/uL (ref 1500–7800)
Neutrophils Relative %: 65 %
Platelets: 271 10*3/uL (ref 140–400)
RBC: 4.01 10*6/uL (ref 3.80–5.10)
RDW: 12.1 % (ref 11.0–15.0)
Total Lymphocyte: 27 %
WBC: 4.4 10*3/uL (ref 3.8–10.8)

## 2020-10-23 LAB — C3 AND C4
C3 Complement: 128 mg/dL (ref 83–193)
C4 Complement: 35 mg/dL (ref 15–57)

## 2020-10-23 LAB — RNP ANTIBODY: Ribonucleic Protein(ENA) Antibody, IgG: 1.1 AI — AB

## 2020-10-26 NOTE — Progress Notes (Signed)
CBC is normal, GFR is low.  Please forward results to her nephrologist.  All other labs are stable.

## 2021-01-29 LAB — HM MAMMOGRAPHY

## 2021-03-22 LAB — HM PAP SMEAR

## 2021-04-09 NOTE — Progress Notes (Signed)
Office Visit Note  Patient: Shelby Campbell             Date of Birth: 1975-07-23           MRN: [id number]             PCP: [name], MD Referring: [name], MD Visit Date: 04/22/2021 Occupation: _0 @  Subjective:  Discuss lab results  History of Present Illness: Shelby Campbell is a 45 y.o. female with history of osteoarthritis and positive ANA.  Patient presents today for routine follow-up.  Her last office visit was on 10/22/2020.  She denies any new or worsening symptoms since her last office visit.  She has not had any recent rashes, signs of alopecia, oral or nasal ulcerations, cervical lymphadenopathy, sicca symptoms, pleuritic chest pain, palpitations, increased fatigue, or fevers.  She has occasional symptoms of Raynaud's but denies any digital ulcerations or skin thickening.  The paresthesias in her right hand have become less frequent.  She was seeing a chiropractor which improved her symptoms.  She has not been wearing the brace recently since her symptoms have been infrequent and tolerable.  She denies any joint pain or joint swelling at this time.  She denies any new medical conditions since her last office visit.  She denies any new questions or concerns.    Activities of Daily Living:  Patient reports morning stiffness for 0 minutes.   Patient Denies nocturnal pain.  Difficulty dressing/grooming: Denies Difficulty climbing stairs: Reports Difficulty getting out of chair: Denies Difficulty using hands for taps, buttons, cutlery, and/or writing: Reports  Review of Systems  Constitutional:  Negative for fatigue.  HENT:  Negative for mouth sores, mouth dryness and nose dryness.   Eyes:  Negative for pain, itching and dryness.  Respiratory:  Negative for shortness of breath and difficulty breathing.   Cardiovascular:  Negative for chest pain and palpitations.  Gastrointestinal:  Positive for constipation. Negative for blood in stool and diarrhea.  Endocrine:  Positive for increased urination.  Genitourinary:  Negative for difficulty urinating and painful urination.  Musculoskeletal:  Negative for joint pain, joint pain, joint swelling, myalgias, morning stiffness, muscle tenderness and myalgias.  Skin:  Positive for color change. Negative for rash and redness.  Allergic/Immunologic: Negative for susceptible to infections.  Neurological:  Positive for dizziness, numbness and headaches. Negative for memory loss and weakness.  Hematological:  Negative for bruising/bleeding tendency.  Psychiatric/Behavioral:  Negative for confusion.    PMFS History:  Patient Active Problem List   Diagnosis Date Noted   Sicca syndrome ([place]) 10/12/2020   Body mass index (BMI) of 25.0 to 25.9 in adult 10/12/2020   Subconjunctival hemorrhage of right eye 10/12/2020   Chronic renal disease, stage III ([place]) 04/11/2018   Asthma 03/01/2018   Benign hypertension with chronic kidney disease 02/26/2018   Chronic gastritis, mild.  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[place])    Shortness of breath    related to exertion    Family History  Problem Relation  Age of Onset   Hypertension Mother    Arthritis Mother    Hypertension Father    Prostate cancer Father    Arthritis Father    Gout Father    Healthy Son    Healthy Daughter    Past Surgical History:  Procedure Laterality Date   AXILLARY LYMPH NODE BIOPSY  2010   right    CHOLECYSTECTOMY     CYSTOSCOPY WITH RETROGRADE PYELOGRAM, URETEROSCOPY AND STENT PLACEMENT Right 10/15/2019   Procedure: CYSTOSCOPY WITH RETROGRADE PYELOGRAM, URETEROSCOPY AND STENT  PLACEMENT, attempted laser of stricture;  Surgeon: Robley Fries, MD;  Location: [facility];  Service: Urology;  Laterality: Right;  1 HR   DILITATION & CURRETTAGE/HYSTROSCOPY WITH NOVASURE ABLATION N/A 05/04/2016   Procedure: DILATATION & CURETTAGE/HYSTEROSCOPY WITH NOVASURE ABLATION;  Surgeon: Servando Salina, MD;  Location: [facility];  Service: Gynecology;  Laterality: N/A;   LAPAROSCOPIC CHOLECYSTECTOMY SINGLE PORT N/A 09/10/2013   Procedure: LAPAROSCOPIC CHOLECYSTECTOMY SINGLE SITE WITH CHOLANGIOGRAM;  Surgeon: Adin Hector, MD;  Location: [facility];  Service: General;  Laterality: N/A;   TUBAL LIGATION     Social History   Social History Narrative   Not on file   Immunization History  Administered Date(s) Administered   Influenza,inj,Quad PF,6+ Mos 03/01/2018, 04/08/2020, 03/27/2021   Janssen (J&J) SARS-COV-2 Vaccination 08/08/2019   Moderna Sars-Covid-2 Vaccination 04/01/2020   Pfizer Covid-19 Vaccine Bivalent Booster 81yr & up 03/27/2021   Td 04/12/2003   Tdap 12/20/2010, 04/13/2021     Objective: Vital Signs: BP (!) 145/87 (BP Location: Left Arm, Patient Position: Sitting, Cuff Size: Normal)   Pulse 64   Ht 5' 4.5" (1.638 m)   Wt 161 lb 12.8 oz (73.4 kg)   BMI 27.34 kg/m    Physical Exam Vitals and nursing note reviewed.  Constitutional:      Appearance: She is well-developed.  HENT:     Head: Normocephalic and atraumatic.  Eyes:     Conjunctiva/sclera: Conjunctivae normal.  Cardiovascular:     Rate and Rhythm: Normal rate and regular rhythm.     Heart sounds: Normal heart sounds.  Pulmonary:     Effort: Pulmonary effort is normal.     Breath sounds: Normal breath sounds.  Abdominal:     General: Bowel sounds are normal.     Palpations: Abdomen is soft.  Musculoskeletal:     Cervical back: Normal range of motion.  Lymphadenopathy:     Cervical: No cervical adenopathy.  Skin:    General: Skin is warm and dry.     Capillary Refill:  Capillary refill takes less than 2 seconds.  Neurological:     Mental Status: She is alert and oriented to person, place, and time.  Psychiatric:        Behavior: Behavior normal.     Musculoskeletal Exam: C-spine, thoracic spine, lumbar spine have good range of motion with no discomfort.  Shoulder joints, elbow joints, wrist joints, MCPs, PIPs, DIPs have good range of motion with no synovitis.  Complete fist formation bilaterally.  Hip joints have good range of motion with no groin pain.  Some tenderness over trochanteric bursa bilaterally.  Knee joints have good range of motion with no discomfort.  No warmth or effusion of knee joints noted.  Ankle joints have good range of motion with no tenderness or joint swelling.  CDAI Exam: CDAI Score: -- Patient Global: --; Provider Global: -- Swollen: --; Tender: -- Joint Exam 04/22/2021   No joint exam has been  documented for this visit   There is currently no information documented on the homunculus. Go to the Rheumatology activity and complete the homunculus joint exam.  Investigation: No additional findings.  Imaging: No results found.  Recent Labs: Lab Results  Component Value Date   WBC 4.4 04/13/2021   HGB 12.7 04/13/2021   PLT 258 04/13/2021   NA 139 04/13/2021   K 4.5 04/13/2021   CL 101 04/13/2021   CO2 25 04/13/2021   GLUCOSE 83 04/13/2021   BUN 25 (H) 04/13/2021   CREATININE 1.51 (H) 04/13/2021   BILITOT 0.5 04/13/2021   ALKPHOS 58 04/13/2021   AST 18 04/13/2021   ALT 14 04/13/2021   PROT 7.3 04/13/2021   ALBUMIN 4.5 04/13/2021   CALCIUM 9.6 04/13/2021   GFRAA 42 (L) 10/22/2020    Speciality Comments: No specialty comments available.  Procedures:  No procedures performed Allergies: Nsaids, Other, and Sulfa antibiotics   Assessment / Plan:     Visit Diagnoses: Pain in both hands - X-rays of her hands in August 2020 were unremarkable.  She has no joint tenderness or synovitis on examination today.  No  clinical features of rheumatoid arthritis.  She has not been experiencing any increased joint pain and has not noticed any inflammation on examination.  She was previously experiencing intermittent paresthesias in the right hand.  She was given a right carpal tunnel brace at her last office visit which alleviated her symptoms.  She was also evaluated by chiropractor who apparently worked with her and her symptoms have been less severe and less frequent.  She declined a nerve conduction study at this time.  She was advised to notify us if she develops any new or worsening symptoms.  Primary osteoarthritis of both feet: Mild-Asymptomatic. She is not experiencing any discomfort in her feet at this time.  She has good range of motion of both ankle joints with no tenderness or joint swelling.  She is wearing proper fitting shoes.  She was advised to notify us if she develops increased joint pain or inflammation.  Positive ANA (antinuclear antibody) - ANA 1: 80 speckled, RF positive, C3-C4 normal, family history of systemic lupus and rheumatoid arthritis: She has no clinical features of autoimmune disease at this time.  She has not had any symptoms consistent with a malar rash, alopecia, photosensitivity, cervical lymphadenopathy, oral or nasal ulcerations, sicca symptoms, shortness of breath, pleuritic chest pain, palpitations, fevers, or joint inflammation.  She has a subjective complaint of Raynaud's.  The symptoms have been infrequent and tolerable.  She had no digital ulcerations or signs of gangrene on examination today.  No skin thickening or tightness was noted.  Discussed lab work from 10/22/2020 in detail: RNP 1.1, ESR within normal limits, complements within normal limits, and double-stranded DNA was negative.  Discussed that her ANA has been a little positive in the past and could be positive due to her family history of systemic lupus.  At this time she does not have any clinical features of SLE or  rheumatoid arthritis.  There was no synovitis on examination.  Discussed possible signs and symptoms to monitor for.  The following lab work will be updated today.  She will follow-up in the office in 6 months or sooner if necessary.- Plan: ANA, RNP Antibody, Anti-DNA antibody, double-stranded, C3 and C4, Sedimentation rate  Other medical conditions are listed as follows:  Family history of rheumatoid arthritis - Mother  Family history of systemic lupus erythematosus - First cousin  Mild intermittent asthma without complication  Benign hypertension with chronic kidney disease: Followed by Dr. Royce Macadamia at [facility].  History of gastritis  Orders: Orders Placed This Encounter  Procedures   ANA   RNP Antibody   Anti-DNA antibody, double-stranded   C3 and C4   Sedimentation rate   No orders of the defined types were placed in this encounter.     Follow-Up Instructions: Return in about 6 months (around 10/21/2021) for Osteoarthritis, +ANA.   Shelby Neas, PA-C  Note - This record has been created using Dragon software.  Chart creation errors have been sought, but may not always  have been located. Such creation errors do not reflect on  the standard of medical care.

## 2021-04-13 ENCOUNTER — Other Ambulatory Visit: Payer: Self-pay

## 2021-04-13 ENCOUNTER — Ambulatory Visit (INDEPENDENT_AMBULATORY_CARE_PROVIDER_SITE_OTHER): Payer: BC Managed Care – PPO | Admitting: Internal Medicine

## 2021-04-13 ENCOUNTER — Encounter: Payer: Self-pay | Admitting: Internal Medicine

## 2021-04-13 ENCOUNTER — Other Ambulatory Visit: Payer: Self-pay | Admitting: Internal Medicine

## 2021-04-13 VITALS — BP 126/88 | HR 62 | Temp 98.0°F | Ht 65.0 in | Wt 155.6 lb

## 2021-04-13 DIAGNOSIS — Z23 Encounter for immunization: Secondary | ICD-10-CM | POA: Diagnosis not present

## 2021-04-13 DIAGNOSIS — Z0000 Encounter for general adult medical examination without abnormal findings: Secondary | ICD-10-CM | POA: Diagnosis not present

## 2021-04-13 DIAGNOSIS — N1831 Chronic kidney disease, stage 3a: Secondary | ICD-10-CM | POA: Diagnosis not present

## 2021-04-13 DIAGNOSIS — I129 Hypertensive chronic kidney disease with stage 1 through stage 4 chronic kidney disease, or unspecified chronic kidney disease: Secondary | ICD-10-CM

## 2021-04-13 DIAGNOSIS — G8929 Other chronic pain: Secondary | ICD-10-CM

## 2021-04-13 DIAGNOSIS — R1031 Right lower quadrant pain: Secondary | ICD-10-CM | POA: Diagnosis not present

## 2021-04-13 LAB — POCT UA - MICROALBUMIN
Albumin/Creatinine Ratio, Urine, POC: <30
Creatinine, POC: 200 mg/dL
Microalbumin Ur, POC: 10 mg/L

## 2021-04-13 LAB — POCT URINALYSIS DIPSTICK
Bilirubin, UA: NEGATIVE
Blood, UA: NEGATIVE
Glucose, UA: NEGATIVE
Ketones, UA: NEGATIVE
Leukocytes, UA: NEGATIVE
Nitrite, UA: NEGATIVE
Protein, UA: NEGATIVE
Spec Grav, UA: 1.02 (ref 1.010–1.025)
Urobilinogen, UA: 0.2 E.U./dL
pH, UA: 6.5 (ref 5.0–8.0)

## 2021-04-13 MED ORDER — TETANUS-DIPHTH-ACELL PERTUSSIS 5-2.5-18.5 LF-MCG/0.5 IM SUSY
0.5000 mL | PREFILLED_SYRINGE | Freq: Once | INTRAMUSCULAR | Status: AC
Start: 2021-04-13 — End: 2021-04-13
  Administered 2021-04-13: 0.5 mL via INTRAMUSCULAR

## 2021-04-13 MED ORDER — METOPROLOL SUCCINATE ER 50 MG PO TB24: 50.0000 mg | ORAL_TABLET | Freq: Every day | ORAL | 2 refills | Status: DC

## 2021-04-13 MED ORDER — FAMOTIDINE 20 MG PO TABS: 20.0000 mg | ORAL_TABLET | Freq: Two times a day (BID) | ORAL | 1 refills | Status: DC

## 2021-04-13 NOTE — Patient Instructions (Signed)

## 2021-04-13 NOTE — Progress Notes (Signed)
I,[name],acting as a Education administrator for Maximino Greenland, MD.,have documented all relevant documentation on the behalf of Maximino Greenland, MD,as directed by  Maximino Greenland, MD while in the presence of Maximino Greenland, MD.  This visit occurred during the SARS-CoV-2 public health emergency.  Safety protocols were in place, including screening questions prior to the visit, additional usage of staff PPE, and extensive cleaning of exam room while observing appropriate contact time as indicated for disinfecting solutions.  Subjective:     Patient ID: Shelby Campbell , female    DOB: Nov 21, 1975 , 45 y.o.   MRN: [id number]   Chief Complaint  Patient presents with   Annual Exam   Hypertension     HPI  The patient is here for a physical examination. She is followed by Dr. Garwin Brothers for her pap smears. She was last seen 03/22/2021, last mammogram was 01/29/2021.  She reports compliance with meds. Denies headaches, chest pain and shortness of breath.   Hypertension This is a chronic problem. The current episode started more than 1 year ago. The problem has been gradually improving since onset. The problem is uncontrolled. Pertinent negatives include no blurred vision, chest pain, palpitations or shortness of breath. The current treatment provides moderate improvement. Hypertensive end-organ damage includes kidney disease.    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[place])    Shortness of breath    related to exertion     Family History  Problem Relation Age of Onset   Hypertension Mother    Arthritis Mother    Hypertension Father    Prostate cancer Father    Arthritis Father    Gout Father    Healthy Son     Healthy Daughter      Current Outpatient Medications:    acetaminophen (TYLENOL) 500 MG tablet, Take 1,000 mg by mouth 2 (two) times daily as needed for headache (or flank pain)., Disp: , Rfl:    albuterol (VENTOLIN HFA) 108 (90 Base) MCG/ACT inhaler, Inhale 2 puffs into the lungs every 6 (six) hours as needed for wheezing or shortness of breath., Disp: 18 g, Rfl: 11   amLODipine (NORVASC) 2.5 MG tablet, Take 2.5 mg by mouth daily., Disp: , Rfl:    cholecalciferol (VITAMIN D3) 25 MCG (1000 UNIT) tablet, Take 1,000 Units by mouth daily. , Disp: , Rfl:    fluticasone furoate-vilanterol (BREO ELLIPTA) 200-25 MCG/INH AEPB, Inhale 1 puff into the lungs daily., Disp: 1 each, Rfl: 5   Multiple Vitamin (MULTIVITAMIN) tablet, Take 1 tablet by mouth daily., Disp: , Rfl:    famotidine (PEPCID) 20 MG tablet, TAKE 1 TABLET(20 MG) BY MOUTH TWICE DAILY, Disp: 180 tablet, Rfl: 3   metoprolol succinate (TOPROL-XL) 50 MG 24 hr tablet, Take 1 tablet (50 mg total) by mouth daily., Disp: 90 tablet, Rfl: 2   Allergies  Allergen Reactions   Nsaids     And ibuprofen cannot take due to chronic kidney disease   Other Other (See Comments)    Sulfonamides, sulfonylureas, barbiturates, antifungals, ketamine, etomidate, rifapentine, rifampicin, rifabutine, nitrofurantoin, metronidazole, ergot derivatives, indinavir, nevirapine,  ritonavir, saquinavir, progestogens, progesterones, carbamazepine, phenytoin, valproate, oxycodone, pentazocine, cocaine, gold antirheumatics agents, sodium aurothiomalate, methyldopa, fenfluramine, ethosuzimide, flupentixol, flutamide, disulfiram, lidocaine    Sulfa Antibiotics Other (See Comments)    Heart beating fast       The patient states she uses tubal ligation for birth control. Last LMP was Patient's last menstrual period was 05/02/2016.. Negative for Dysmenorrhea. Negative for: breast discharge, breast lump(s), breast pain and breast self exam. Associated symptoms include abnormal  vaginal bleeding. Pertinent negatives include abnormal bleeding (hematology), anxiety, decreased libido, depression, difficulty falling sleep, dyspareunia, history of infertility, nocturia, sexual dysfunction, sleep disturbances, urinary incontinence, urinary urgency, vaginal discharge and vaginal itching. Diet regular.The patient states her exercise level is  intermittent.   . The patient's tobacco use is:  Social History   Tobacco Use  Smoking Status Never  Smokeless Tobacco Never  . She has been exposed to passive smoke. The patient's alcohol use is:  Social History   Substance and Sexual Activity  Alcohol Use Yes   Comment: occasional    Review of Systems  Constitutional: Negative.   HENT: Negative.    Eyes: Negative.  Negative for blurred vision.  Respiratory: Negative.  Negative for shortness of breath.   Cardiovascular: Negative.  Negative for chest pain and palpitations.  Gastrointestinal:  Positive for abdominal pain.       She c/o RLQ pain. This is similar to pain she had last year, subsequently diagnosed with hydronephrosis. Had full w/u by Urology, cause for hydronephrosis not yet identified. Additionally, she had full w/u by GYN. States her sx recurred about a week or two ago, was not having pain during her last GYN visit which was Nov 2022. No associated n/v. She does admit to chronic constipation, not yet sure if her sx are relieved with bowel movements. Admits to increased belching as well.   Endocrine: Negative.   Genitourinary: Negative.   Musculoskeletal: Negative.   Skin: Negative.   Allergic/Immunologic: Negative.   Neurological: Negative.   Hematological: Negative.   Psychiatric/Behavioral: Negative.      Today's Vitals   04/13/21 0934  BP: 126/88  Pulse: 62  Temp: 98 F (36.7 C)  Weight: 155 lb 9.6 oz (70.6 kg)  Height: 5' 5"  (1.651 m)   Body mass index is 25.89 kg/m.  Wt Readings from Last 3 Encounters:  04/22/21 161 lb 12.8 oz (73.4 kg)  04/13/21  155 lb 9.6 oz (70.6 kg)  10/22/20 154 lb 6.4 oz (70 kg)    BP Readings from Last 3 Encounters:  04/22/21 (!) 145/87  04/13/21 126/88  10/22/20 (!) 139/93    Objective:  Physical Exam Vitals and nursing note reviewed.  Constitutional:      Appearance: Normal appearance.  HENT:     Head: Normocephalic and atraumatic.     Nose:     Comments: Masked     Mouth/Throat:     Comments: Masked  Eyes:     Extraocular Movements: Extraocular movements intact.  Cardiovascular:     Rate and Rhythm: Normal rate and regular rhythm.     Heart sounds: Normal heart sounds.  Pulmonary:     Effort: Pulmonary effort is normal.     Breath sounds: Normal breath sounds.  Chest:  Breasts:    Tanner Score is 5.     Right: Normal.     Left: Normal.  Abdominal:     General: Bowel sounds are normal.     Palpations: Abdomen is soft.  Tenderness: There is abdominal tenderness.  Musculoskeletal:     Cervical back: Normal range of motion.  Skin:    General: Skin is warm.  Neurological:     General: No focal deficit present.     Mental Status: She is alert.  Psychiatric:        Mood and Affect: Mood normal.        Behavior: Behavior normal.        Assessment And Plan:     1. Routine general medical examination at health care facility Comments: A full exam was performed. Importance of monthly self breast exams was discussed with the patient. PATIENT IS ADVISED TO GET 30-45 MINUTES REGULAR EXERCISE NO LESS THAN FOUR TO FIVE DAYS PER WEEK - BOTH WEIGHTBEARING EXERCISES AND AEROBIC ARE RECOMMENDED.  PATIENT IS ADVISED TO FOLLOW A HEALTHY DIET WITH AT LEAST SIX FRUITS/VEGGIES PER DAY, DECREASE INTAKE OF RED MEAT, AND TO INCREASE FISH INTAKE TO TWO DAYS PER WEEK.  MEATS/FISH SHOULD NOT BE FRIED, BAKED OR BROILED IS PREFERABLE.  IT IS ALSO IMPORTANT TO CUT BACK ON YOUR SUGAR INTAKE. PLEASE AVOID ANYTHING WITH ADDED SUGAR, CORN SYRUP OR OTHER SWEETENERS. IF YOU MUST USE A SWEETENER, YOU CAN TRY STEVIA. IT  IS ALSO IMPORTANT TO AVOID ARTIFICIALLY SWEETENERS AND DIET BEVERAGES. LASTLY, I SUGGEST WEARING SPF 50 SUNSCREEN ON EXPOSED PARTS AND ESPECIALLY WHEN IN THE DIRECT SUNLIGHT FOR AN EXTENDED PERIOD OF TIME.  PLEASE AVOID FAST FOOD RESTAURANTS AND INCREASE YOUR WATER INTAKE.  - Hemoglobin A1c - CBC - CMP14+EGFR - Lipid panel  2. Benign hypertension with chronic kidney disease Comments: Chronic, fair control. She is aware of diastolic elevation. She is advised goal BP is <130/80. Encouraged her to increase activity and decrease salt intake. She will c/w amlodipine/metoprolol. Advised to f/u in 4-6 months.  - POCT Urinalysis Dipstick (81002) - POCT UA - Microalbumin - EKG 12-Lead - Hemoglobin A1c - CBC - CMP14+EGFR - Lipid panel - metoprolol succinate (TOPROL-XL) 50 MG 24 hr tablet; Take 1 tablet (50 mg total) by mouth daily.  Dispense: 90 tablet; Refill: 2  3. Benign hypertension with chronic kidney disease Chronic, fair control. She is aware of diastolic elevation. She is advised goal BP is <130/80. Encouraged her to increase activity and decrease salt intake. She will c/w amlodipine/metoprolol. Advised to f/u in 4-6 months.  - POCT Urinalysis Dipstick (81002) - POCT UA - Microalbumin - EKG 12-Lead - Hemoglobin A1c - CBC - CMP14+EGFR - Lipid panel - metoprolol succinate (TOPROL-XL) 50 MG 24 hr tablet; Take 1 tablet (50 mg total) by mouth daily.  Dispense: 90 tablet; Refill: 2  4. Stage 3a chronic kidney disease ([place]) Comments: Chronic, also followed by Nephrology. Advised to stay well hydrated, keep BP well controlled and to avoid NSAIDs.  - Protein electrophoresis, serum - Parathyroid Hormone, Intact w/Ca - Phosphorus  5. Chronic RLQ pain Comments: Advised to start famotidine bid x 1-2 wks, then qd. If persistent, will consider bowel regimen. If still persistent, will consider repeat imaging.  6. Immunization due - Tdap (BOOSTRIX) injection 0.5 mL  COMPUTER SYSTEM WILL NOT LET  ME DELETE SECOND LISTING OF HTN DIAGNOSIS, EVEN WHEN ASSOCIATIONS W/ ORDERS HAVE BEEN REMOVED.  Patient was given opportunity to ask questions. Patient verbalized understanding of the plan and was able to repeat key elements of the plan. All questions were answered to their satisfaction.   I, Maximino Greenland, MD, have reviewed all documentation for this visit. The documentation on 04/13/21  for the exam, diagnosis, procedures, and orders are all accurate and complete.   THE PATIENT IS ENCOURAGED TO PRACTICE SOCIAL DISTANCING DUE TO THE COVID-19 PANDEMIC.

## 2021-04-14 ENCOUNTER — Encounter: Admitting: Internal Medicine

## 2021-04-16 LAB — CBC
Hematocrit: 37.8 % (ref 34.0–46.6)
Hemoglobin: 12.7 g/dL (ref 11.1–15.9)
MCH: 30.5 pg (ref 26.6–33.0)
MCHC: 33.6 g/dL (ref 31.5–35.7)
MCV: 91 fL (ref 79–97)
Platelets: 258 10*3/uL (ref 150–450)
RBC: 4.17 x10E6/uL (ref 3.77–5.28)
RDW: 11.7 % (ref 11.7–15.4)
WBC: 4.4 10*3/uL (ref 3.4–10.8)

## 2021-04-16 LAB — CMP14+EGFR
ALT: 14 IU/L (ref 0–32)
AST: 18 IU/L (ref 0–40)
Albumin/Globulin Ratio: 1.6 (ref 1.2–2.2)
Albumin: 4.5 g/dL (ref 3.8–4.8)
Alkaline Phosphatase: 58 IU/L (ref 44–121)
BUN/Creatinine Ratio: 17 (ref 9–23)
BUN: 25 mg/dL — ABNORMAL HIGH (ref 6–24)
Bilirubin Total: 0.5 mg/dL (ref 0.0–1.2)
CO2: 25 mmol/L (ref 20–29)
Calcium: 9.6 mg/dL (ref 8.7–10.2)
Chloride: 101 mmol/L (ref 96–106)
Creatinine, Ser: 1.51 mg/dL — ABNORMAL HIGH (ref 0.57–1.00)
Globulin, Total: 2.8 g/dL (ref 1.5–4.5)
Glucose: 83 mg/dL (ref 70–99)
Potassium: 4.5 mmol/L (ref 3.5–5.2)
Sodium: 139 mmol/L (ref 134–144)
Total Protein: 7.3 g/dL (ref 6.0–8.5)
eGFR: 44 mL/min/{1.73_m2} — ABNORMAL LOW (ref >59)

## 2021-04-16 LAB — LIPID PANEL
Chol/HDL Ratio: 2.7 ratio (ref 0.0–4.4)
Cholesterol, Total: 174 mg/dL (ref 100–199)
HDL: 64 mg/dL (ref >39)
LDL Chol Calc (NIH): 98 mg/dL (ref 0–99)
Triglycerides: 61 mg/dL (ref 0–149)
VLDL Cholesterol Cal: 12 mg/dL (ref 5–40)

## 2021-04-16 LAB — PROTEIN ELECTROPHORESIS, SERUM
A/G Ratio: 1.1 (ref 0.7–1.7)
Albumin ELP: 3.9 g/dL (ref 2.9–4.4)
Alpha 1: 0.2 g/dL (ref 0.0–0.4)
Alpha 2: 0.6 g/dL (ref 0.4–1.0)
Beta: 1.1 g/dL (ref 0.7–1.3)
Gamma Globulin: 1.5 g/dL (ref 0.4–1.8)
Globulin, Total: 3.4 g/dL (ref 2.2–3.9)

## 2021-04-16 LAB — HEMOGLOBIN A1C
Est. average glucose Bld gHb Est-mCnc: 114 mg/dL
Hgb A1c MFr Bld: 5.6 % (ref 4.8–5.6)

## 2021-04-16 LAB — PTH, INTACT AND CALCIUM: PTH: 97 pg/mL — ABNORMAL HIGH (ref 15–65)

## 2021-04-16 LAB — PHOSPHORUS: Phosphorus: 3 mg/dL (ref 3.0–4.3)

## 2021-04-21 ENCOUNTER — Encounter: Payer: Self-pay | Admitting: Internal Medicine

## 2021-04-22 ENCOUNTER — Other Ambulatory Visit: Payer: Self-pay

## 2021-04-22 ENCOUNTER — Encounter: Payer: Self-pay | Admitting: Physician Assistant

## 2021-04-22 ENCOUNTER — Ambulatory Visit (INDEPENDENT_AMBULATORY_CARE_PROVIDER_SITE_OTHER): Payer: BC Managed Care – PPO | Admitting: Physician Assistant

## 2021-04-22 VITALS — BP 145/87 | HR 64 | Ht 64.5 in | Wt 161.8 lb

## 2021-04-22 DIAGNOSIS — M19071 Primary osteoarthritis, right ankle and foot: Secondary | ICD-10-CM | POA: Diagnosis not present

## 2021-04-22 DIAGNOSIS — M79642 Pain in left hand: Secondary | ICD-10-CM

## 2021-04-22 DIAGNOSIS — M79641 Pain in right hand: Secondary | ICD-10-CM

## 2021-04-22 DIAGNOSIS — Z8261 Family history of arthritis: Secondary | ICD-10-CM

## 2021-04-22 DIAGNOSIS — R768 Other specified abnormal immunological findings in serum: Secondary | ICD-10-CM | POA: Diagnosis not present

## 2021-04-22 DIAGNOSIS — Z8719 Personal history of other diseases of the digestive system: Secondary | ICD-10-CM

## 2021-04-22 DIAGNOSIS — I129 Hypertensive chronic kidney disease with stage 1 through stage 4 chronic kidney disease, or unspecified chronic kidney disease: Secondary | ICD-10-CM

## 2021-04-22 DIAGNOSIS — M19072 Primary osteoarthritis, left ankle and foot: Secondary | ICD-10-CM

## 2021-04-22 DIAGNOSIS — N183 Chronic kidney disease, stage 3 unspecified: Secondary | ICD-10-CM

## 2021-04-22 DIAGNOSIS — Z8269 Family history of other diseases of the musculoskeletal system and connective tissue: Secondary | ICD-10-CM

## 2021-04-22 DIAGNOSIS — J4520 Mild intermittent asthma, uncomplicated: Secondary | ICD-10-CM

## 2021-04-23 NOTE — Progress Notes (Signed)
Complements and ESR WNL.

## 2021-04-23 NOTE — Progress Notes (Signed)
RNP remains borderline positive.  dsDNA is negative.  Complements WNL.  dsDNA is negative.

## 2021-04-26 LAB — ANA: Anti Nuclear Antibody (ANA): POSITIVE — AB

## 2021-04-26 LAB — ANTI-DNA ANTIBODY, DOUBLE-STRANDED: ds DNA Ab: 1 IU/mL

## 2021-04-26 LAB — ANTI-NUCLEAR AB-TITER (ANA TITER): ANA Titer 1: 1:40 {titer} — ABNORMAL HIGH

## 2021-04-26 LAB — C3 AND C4
C3 Complement: 134 mg/dL (ref 83–193)
C4 Complement: 34 mg/dL (ref 15–57)

## 2021-04-26 LAB — RNP ANTIBODY: Ribonucleic Protein(ENA) Antibody, IgG: 1 AI — AB

## 2021-04-26 LAB — SEDIMENTATION RATE: Sed Rate: 11 mm/h (ref 0–20)

## 2021-04-27 NOTE — Progress Notes (Signed)
ANA remains positive but is a low insignificant titer

## 2021-04-28 ENCOUNTER — Encounter: Payer: Self-pay | Admitting: Internal Medicine

## 2021-05-13 LAB — BASIC METABOLIC PANEL
BUN: 22 — AB (ref 4–21)
CO2: 28 — AB (ref 13–22)
Chloride: 102 (ref 99–108)
Creatinine: 1.5 — AB (ref 0.5–1.1)
Glucose: 74
Potassium: 3.8 (ref 3.4–5.3)
Sodium: 138 (ref 137–147)

## 2021-05-13 LAB — COMPREHENSIVE METABOLIC PANEL
Albumin: 4.2 (ref 3.5–5.0)
Calcium: 9.6 (ref 8.7–10.7)
GFR calc Af Amer: 46

## 2021-05-27 ENCOUNTER — Encounter: Payer: Self-pay | Admitting: Internal Medicine

## 2021-08-07 ENCOUNTER — Other Ambulatory Visit: Payer: Self-pay | Admitting: Internal Medicine

## 2021-10-07 NOTE — Progress Notes (Deleted)
Office Visit Note  Patient: Shelby Campbell             Date of Birth: [birth year]           MRN: [id number]             PCP: [name], MD Referring: [name], MD Visit Date: 10/21/2021 Occupation: '@GUAROCC'$ @  Subjective:  No chief complaint on file.   History of Present Illness: Shelby Campbell is a 45 y.o. female ***   Activities of Daily Living:  Patient reports morning stiffness for *** {minute/hour:19697}.   Patient {ACTIONS;DENIES/REPORTS:21021675::"Denies"} nocturnal pain.  Difficulty dressing/grooming: {ACTIONS;DENIES/REPORTS:21021675::"Denies"} Difficulty climbing stairs: {ACTIONS;DENIES/REPORTS:21021675::"Denies"} Difficulty getting out of chair: {ACTIONS;DENIES/REPORTS:21021675::"Denies"} Difficulty using hands for taps, buttons, cutlery, and/or writing: {ACTIONS;DENIES/REPORTS:21021675::"Denies"}  No Rheumatology ROS completed.   PMFS History:  Patient Active Problem List   Diagnosis Date Noted   Sicca syndrome ([place]) 10/12/2020   Body mass index (BMI) of 25.0 to 25.9 in adult 10/12/2020   Subconjunctival hemorrhage of right eye 10/12/2020   Chronic renal disease, stage III ([place]) 04/11/2018   Asthma 03/01/2018   Benign hypertension with chronic kidney disease 02/26/2018   Chronic gastritis, mild.  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[place])    Shortness of breath    related to exertion    Family History  Problem Relation Age of Onset   Hypertension Mother    Arthritis Mother    Hypertension Father    Prostate cancer Father    Arthritis Father    Gout Father    Healthy Son    Healthy  Daughter    Past Surgical History:  Procedure Laterality Date   AXILLARY LYMPH NODE BIOPSY  2010   right    CHOLECYSTECTOMY     CYSTOSCOPY WITH RETROGRADE PYELOGRAM, URETEROSCOPY AND STENT PLACEMENT Right 10/15/2019   Procedure: CYSTOSCOPY WITH RETROGRADE PYELOGRAM, URETEROSCOPY AND STENT PLACEMENT, attempted laser of stricture;  Surgeon: Robley Fries, MD;  Location: [facility];  Service: Urology;  Laterality: Right;  1 HR   DILITATION & CURRETTAGE/HYSTROSCOPY WITH NOVASURE ABLATION N/A 05/04/2016   Procedure: DILATATION & CURETTAGE/HYSTEROSCOPY WITH NOVASURE ABLATION;  Surgeon: Servando Salina, MD;  Location: [facility];  Service: Gynecology;  Laterality: N/A;   LAPAROSCOPIC CHOLECYSTECTOMY SINGLE PORT N/A 09/10/2013   Procedure: LAPAROSCOPIC CHOLECYSTECTOMY SINGLE SITE WITH CHOLANGIOGRAM;  Surgeon: Adin Hector, MD;  Location: [facility];  Service: General;  Laterality: N/A;   TUBAL LIGATION     Social History   Social History Narrative   Not on file   Immunization History  Administered Date(s) Administered   Influenza,inj,Quad PF,6+ Mos 03/01/2018, 04/08/2020, 03/27/2021   Janssen (J&J) SARS-COV-2 Vaccination 08/08/2019   Moderna Sars-Covid-2 Vaccination 04/01/2020   Pfizer Covid-19 Vaccine Bivalent Booster 74yr & up 03/27/2021   Td 04/12/2003   Tdap 12/20/2010, 04/13/2021     Objective: Vital Signs: There were no vitals taken for this visit.   Physical Exam   Musculoskeletal Exam: ***  CDAI Exam: CDAI Score: -- Patient Global: --;  Provider Global: -- Swollen: --; Tender: -- Joint Exam 10/21/2021   No joint exam has been documented for this visit   There is currently no information documented on the homunculus. Go to the Rheumatology activity and complete the homunculus joint exam.  Investigation: No additional findings.  Imaging: No results found.  Recent Labs: Lab Results  Component Value Date   WBC 4.4 04/13/2021   HGB 12.7 04/13/2021    PLT 258 04/13/2021   NA 138 05/13/2021   K 3.8 05/13/2021   CL 102 05/13/2021   CO2 28 (A) 05/13/2021   GLUCOSE 83 04/13/2021   BUN 22 (A) 05/13/2021   CREATININE 1.5 (A) 05/13/2021   BILITOT 0.5 04/13/2021   ALKPHOS 58 04/13/2021   AST 18 04/13/2021   ALT 14 04/13/2021   PROT 7.3 04/13/2021   ALBUMIN 4.2 05/13/2021   CALCIUM 9.6 05/13/2021   GFRAA 46 05/13/2021    Speciality Comments: No specialty comments available.  Procedures:  No procedures performed Allergies: Nsaids, Other, and Sulfa antibiotics   Assessment / Plan:     Visit Diagnoses: No diagnosis found.  Orders: No orders of the defined types were placed in this encounter.  No orders of the defined types were placed in this encounter.   Face-to-face time spent with patient was *** minutes. Greater than 50% of time was spent in counseling and coordination of care.  Follow-Up Instructions: No follow-ups on file.   Earnestine Mealing, CMA  Note - This record has been created using Editor, commissioning.  Chart creation errors have been sought, but may not always  have been located. Such creation errors do not reflect on  the standard of medical care.

## 2021-10-10 ENCOUNTER — Other Ambulatory Visit: Payer: Self-pay | Admitting: Internal Medicine

## 2021-10-11 ENCOUNTER — Other Ambulatory Visit: Payer: Self-pay | Admitting: Internal Medicine

## 2021-10-13 ENCOUNTER — Ambulatory Visit: Payer: BC Managed Care – PPO | Admitting: Internal Medicine

## 2021-10-13 ENCOUNTER — Encounter: Payer: Self-pay | Admitting: Internal Medicine

## 2021-10-13 VITALS — BP 120/72 | HR 87 | Temp 97.7°F | Ht 64.5 in | Wt 160.4 lb

## 2021-10-13 DIAGNOSIS — I129 Hypertensive chronic kidney disease with stage 1 through stage 4 chronic kidney disease, or unspecified chronic kidney disease: Secondary | ICD-10-CM | POA: Diagnosis not present

## 2021-10-13 DIAGNOSIS — Z6827 Body mass index (BMI) 27.0-27.9, adult: Secondary | ICD-10-CM

## 2021-10-13 DIAGNOSIS — N1831 Chronic kidney disease, stage 3a: Secondary | ICD-10-CM

## 2021-10-13 DIAGNOSIS — Z7184 Encounter for health counseling related to travel: Secondary | ICD-10-CM

## 2021-10-13 DIAGNOSIS — J4520 Mild intermittent asthma, uncomplicated: Secondary | ICD-10-CM

## 2021-10-13 DIAGNOSIS — M3500 Sicca syndrome, unspecified: Secondary | ICD-10-CM

## 2021-10-13 MED ORDER — ALBUTEROL SULFATE HFA 108 (90 BASE) MCG/ACT IN AERS: 2.0000 | INHALATION_SPRAY | Freq: Four times a day (QID) | RESPIRATORY_TRACT | 3 refills | Status: DC | PRN

## 2021-10-13 MED ORDER — MONTELUKAST SODIUM 10 MG PO TABS: 10.0000 mg | ORAL_TABLET | Freq: Every day | ORAL | 2 refills | Status: DC

## 2021-10-13 MED ORDER — MEFLOQUINE HCL 250 MG PO TABS: ORAL_TABLET | ORAL | 0 refills | Status: DC

## 2021-10-13 MED ORDER — FLUTICASONE FUROATE-VILANTEROL 200-25 MCG/ACT IN AEPB: 1.0000 | INHALATION_SPRAY | Freq: Every day | RESPIRATORY_TRACT | 5 refills | Status: DC

## 2021-10-13 MED ORDER — FAMOTIDINE 20 MG PO TABS
20.0000 mg | ORAL_TABLET | Freq: Every day | ORAL | 1 refills | Status: DC
Start: 2021-10-13 — End: 2022-12-30

## 2021-10-13 NOTE — Patient Instructions (Signed)
Hypertension, Adult ?Hypertension is another name for high blood pressure. High blood pressure forces your heart to work harder to pump blood. This can cause problems over time. ?There are two numbers in a blood pressure reading. There is a top number (systolic) over a bottom number (diastolic). It is best to have a blood pressure that is below 120/80. ?What are the causes? ?The cause of this condition is not known. Some other conditions can lead to high blood pressure. ?What increases the risk? ?Some lifestyle factors can make you more likely to develop high blood pressure: ?Smoking. ?Not getting enough exercise or physical activity. ?Being overweight. ?Having too much fat, sugar, calories, or salt (sodium) in your diet. ?Drinking too much alcohol. ?Other risk factors include: ?Having any of these conditions: ?Heart disease. ?Diabetes. ?High cholesterol. ?Kidney disease. ?Obstructive sleep apnea. ?Having a family history of high blood pressure and high cholesterol. ?Age. The risk increases with age. ?Stress. ?What are the signs or symptoms? ?High blood pressure may not cause symptoms. Very high blood pressure (hypertensive crisis) may cause: ?Headache. ?Fast or uneven heartbeats (palpitations). ?Shortness of breath. ?Nosebleed. ?Vomiting or feeling like you may vomit (nauseous). ?Changes in how you see. ?Very bad chest pain. ?Feeling dizzy. ?Seizures. ?How is this treated? ?This condition is treated by making healthy lifestyle changes, such as: ?Eating healthy foods. ?Exercising more. ?Drinking less alcohol. ?Your doctor may prescribe medicine if lifestyle changes do not help enough and if: ?Your top number is above 130. ?Your bottom number is above 80. ?Your personal target blood pressure may vary. ?Follow these instructions at home: ?Eating and drinking ? ?If told, follow the DASH eating plan. To follow this plan: ?Fill one half of your plate at each meal with fruits and vegetables. ?Fill one fourth of your plate  at each meal with whole grains. Whole grains include whole-wheat pasta, brown rice, and whole-grain bread. ?Eat or drink low-fat dairy products, such as skim milk or low-fat yogurt. ?Fill one fourth of your plate at each meal with low-fat (lean) proteins. Low-fat proteins include fish, chicken without skin, eggs, beans, and tofu. ?Avoid fatty meat, cured and processed meat, or chicken with skin. ?Avoid pre-made or processed food. ?Limit the amount of salt in your diet to less than 1,500 mg each day. ?Do not drink alcohol if: ?Your doctor tells you not to drink. ?You are pregnant, may be pregnant, or are planning to become pregnant. ?If you drink alcohol: ?Limit how much you have to: ?0-1 drink a day for women. ?0-2 drinks a day for men. ?Know how much alcohol is in your drink. In the [place] one drink equals one 12 oz bottle of beer (355 mL), one 5 oz glass of wine (148 mL), or one 1? oz glass of hard liquor (44 mL). ?Lifestyle ? ?Work with your doctor to stay at a healthy weight or to lose weight. Ask your doctor what the best weight is for you. ?Get at least 30 minutes of exercise that causes your heart to beat faster (aerobic exercise) most days of the week. This may include walking, swimming, or biking. ?Get at least 30 minutes of exercise that strengthens your muscles (resistance exercise) at least 3 days a week. This may include lifting weights or doing Pilates. ?Do not smoke or use any products that contain nicotine or tobacco. If you need help quitting, ask your doctor. ?Check your blood pressure at home as told by your doctor. ?Keep all follow-up visits. ?Medicines ?Take over-the-counter and prescription medicines   only as told by your doctor. Follow directions carefully. ?Do not skip doses of blood pressure medicine. The medicine does not work as well if you skip doses. Skipping doses also puts you at risk for problems. ?Ask your doctor about side effects or reactions to medicines that you should watch  for. ?Contact a doctor if: ?You think you are having a reaction to the medicine you are taking. ?You have headaches that keep coming back. ?You feel dizzy. ?You have swelling in your ankles. ?You have trouble with your vision. ?Get help right away if: ?You get a very bad headache. ?You start to feel mixed up (confused). ?You feel weak or numb. ?You feel faint. ?You have very bad pain in your: ?Chest. ?Belly (abdomen). ?You vomit more than once. ?You have trouble breathing. ?These symptoms may be an emergency. Get help right away. Call 911. ?Do not wait to see if the symptoms will go away. ?Do not drive yourself to the hospital. ?Summary ?Hypertension is another name for high blood pressure. ?High blood pressure forces your heart to work harder to pump blood. ?For most people, a normal blood pressure is less than 120/80. ?Making healthy choices can help lower blood pressure. If your blood pressure does not get lower with healthy choices, you may need to take medicine. ?This information is not intended to replace advice given to you by your health care provider. Make sure you discuss any questions you have with your health care provider. ?Document Revised: 02/11/2021 Document Reviewed: 02/11/2021 ?Elsevier Patient Education ? 2023 [employer] ? ?

## 2021-10-13 NOTE — Progress Notes (Signed)
Rich Brave [place],acting as a Education administrator for Maximino Greenland, MD.,have documented all relevant documentation on the behalf of Maximino Greenland, MD,as directed by  Maximino Greenland, MD while in the presence of Maximino Greenland, MD.  This visit occurred during the SARS-CoV-2 public health emergency.  Safety protocols were in place, including screening questions prior to the visit, additional usage of staff PPE, and extensive cleaning of exam room while observing appropriate contact time as indicated for disinfecting solutions.  Subjective:     Patient ID: Shelby Campbell , female    DOB: 26-Jun-1975 , 46 y.o.   MRN: [id number]   Chief Complaint  Patient presents with   Hypertension    HPI  She presents today for bp check. She reports compliance with meds. Denies headaches, chest pain and shortness of breath.   Hypertension This is a chronic problem. The current episode started more than 1 year ago. The problem has been gradually improving since onset. The problem is controlled. Pertinent negatives include no blurred vision. Past treatments include ACE inhibitors. The current treatment provides moderate improvement. Hypertensive end-organ damage includes kidney disease.     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[place])    Shortness of breath    related to exertion     Family History  Problem Relation Age of Onset   Hypertension Mother    Arthritis Mother    Hypertension Father    Prostate cancer Father    Arthritis Father    Gout Father    Healthy Son    Healthy Daughter      Current Outpatient Medications:    acetaminophen (TYLENOL) 500 MG tablet, Take 1,000 mg by mouth 2 (two) times daily as needed for  headache (or flank pain)., Disp: , Rfl:    amLODipine (NORVASC) 2.5 MG tablet, Take 2.5 mg by mouth daily., Disp: , Rfl:    cholecalciferol (VITAMIN D3) 25 MCG (1000 UNIT) tablet, Take 1,000 Units by mouth daily. , Disp: , Rfl:    mefloquine (LARIAM) 250 MG tablet, START ONE WEEK PRIOR TO TRAVEL TO [place], AND CONTINUE FOR FOUR WEEKS AFTER YOUR RETURN, TAKE ONE WEEKLY STARTING 10/29/21, Disp: 8 tablet, Rfl: 0   metoprolol succinate (TOPROL-XL) 50 MG 24 hr tablet, Take 1 tablet (50 mg total) by mouth daily., Disp: 90 tablet, Rfl: 2   montelukast (SINGULAIR) 10 MG tablet, Take 1 tablet (10 mg total) by mouth daily., Disp: 30 tablet, Rfl: 2   Multiple Vitamin (MULTIVITAMIN) tablet, Take 1 tablet by mouth daily., Disp: , Rfl:    albuterol (VENTOLIN HFA) 108 (90 Base) MCG/ACT inhaler, Inhale 2 puffs into the lungs every 6 (six) hours as needed for wheezing or shortness of breath., Disp: 54 g, Rfl: 3   famotidine (PEPCID) 20 MG tablet, Take 1 tablet (20 mg total) by mouth daily., Disp: 90 tablet, Rfl: 1   fluticasone furoate-vilanterol (BREO ELLIPTA) 200-25 MCG/ACT AEPB, INHALE 1 PUFF INTO THE LUNGS DAILY, Disp: 180 each, Rfl: 1   fluticasone furoate-vilanterol (BREO ELLIPTA) 200-25 MCG/ACT AEPB, Inhale 1 puff into the lungs daily., Disp:  60 each, Rfl: 5   Allergies  Allergen Reactions   Nsaids     And ibuprofen cannot take due to chronic kidney disease   Other Other (See Comments)    Sulfonamides, sulfonylureas, barbiturates, antifungals, ketamine, etomidate, rifapentine, rifampicin, rifabutine, nitrofurantoin, metronidazole, ergot derivatives, indinavir, nevirapine, ritonavir, saquinavir, progestogens, progesterones, carbamazepine, phenytoin, valproate, oxycodone, pentazocine, cocaine, gold antirheumatics agents, sodium aurothiomalate, methyldopa, fenfluramine, ethosuzimide, flupentixol, flutamide, disulfiram, lidocaine    Sulfa Antibiotics Other (See Comments)    Heart beating fast       Review of Systems  Constitutional: Negative.   Eyes:  Negative for blurred vision.  Respiratory: Negative.    Cardiovascular: Negative.   Neurological: Negative.   Psychiatric/Behavioral: Negative.       Today's Vitals   10/13/21 0926  BP: 120/72  Pulse: 87  Temp: 97.7 F (36.5 C)  Weight: 160 lb 6.4 oz (72.8 kg)  Height: 5' 4.5" (1.638 m)  PainSc: 0-No pain   Body mass index is 27.11 kg/m.  Wt Readings from Last 3 Encounters:  10/13/21 160 lb 6.4 oz (72.8 kg)  04/22/21 161 lb 12.8 oz (73.4 kg)  04/13/21 155 lb 9.6 oz (70.6 kg)     Objective:  Physical Exam Vitals and nursing note reviewed.  Constitutional:      Appearance: Normal appearance.  HENT:     Head: Normocephalic and atraumatic.  Cardiovascular:     Rate and Rhythm: Normal rate and regular rhythm.     Heart sounds: Normal heart sounds.  Pulmonary:     Effort: Pulmonary effort is normal.     Breath sounds: Normal breath sounds.  Musculoskeletal:     Cervical back: Normal range of motion.  Skin:    General: Skin is warm.  Neurological:     General: No focal deficit present.     Mental Status: She is alert.  Psychiatric:        Mood and Affect: Mood normal.        Behavior: Behavior normal.      Assessment And Plan:     1. Benign hypertension with chronic kidney disease Comments: Chronic, well controlled. She is encouraged to follow low sodium diet.  She will c/w metoprolol and amlodipine. She will f/u in 4-6 months.  - CMP14+EGFR  2. Stage 3a chronic kidney disease ([place]) Comments: Chronic, also followed by Nephrology. She is encouraged to avoid NSAIDS, stay well hydrated and keep BP controlled to decrease risk of CKD progression.  - CMP14+EGFR - Protein electrophoresis, serum - Parathyroid Hormone, Intact w/Ca - Phosphorus  3. Mild intermittent asthma without complication Comments: Chronic, yet stable.  Refills on meds sent as below, reminded to use albuterol prn.   4. Counseling about  travel Comments: She leaves for [place] on 6[place]30-7/11. She would like rx for malaria.   5. BMI 27.0-27.9,adult Comments: She is encouraged to aim for at least 150 minutes of exercise per week.    Patient was given opportunity to ask questions. Patient verbalized understanding of the plan and was able to repeat key elements of the plan. All questions were answered to their satisfaction.   I, Maximino Greenland, MD, have reviewed all documentation for this visit. The documentation on 10/13/21 for the exam, diagnosis, procedures, and orders are all accurate and complete.   IF YOU HAVE BEEN REFERRED TO A SPECIALIST, IT MAY TAKE 1-2 WEEKS TO SCHEDULE/PROCESS THE REFERRAL. IF YOU HAVE NOT HEARD FROM US/SPECIALIST IN TWO WEEKS, PLEASE GIVE [place] A CALL AT  [phone].   THE PATIENT IS ENCOURAGED TO PRACTICE SOCIAL DISTANCING DUE TO THE COVID-19 PANDEMIC.

## 2021-10-15 LAB — PROTEIN ELECTROPHORESIS, SERUM
A/G Ratio: 1.2 (ref 0.7–1.7)
Albumin ELP: 3.9 g/dL (ref 2.9–4.4)
Alpha 1: 0.2 g/dL (ref 0.0–0.4)
Alpha 2: 0.6 g/dL (ref 0.4–1.0)
Beta: 1 g/dL (ref 0.7–1.3)
Gamma Globulin: 1.5 g/dL (ref 0.4–1.8)
Globulin, Total: 3.2 g/dL (ref 2.2–3.9)

## 2021-10-15 LAB — CMP14+EGFR
ALT: 13 IU/L (ref 0–32)
AST: 17 IU/L (ref 0–40)
Albumin/Globulin Ratio: 1.6 (ref 1.2–2.2)
Albumin: 4.4 g/dL (ref 3.8–4.8)
Alkaline Phosphatase: 53 IU/L (ref 44–121)
BUN/Creatinine Ratio: 17 (ref 9–23)
BUN: 27 mg/dL — ABNORMAL HIGH (ref 6–24)
Bilirubin Total: 0.4 mg/dL (ref 0.0–1.2)
CO2: 23 mmol/L (ref 20–29)
Calcium: 9.4 mg/dL (ref 8.7–10.2)
Chloride: 102 mmol/L (ref 96–106)
Creatinine, Ser: 1.55 mg/dL — ABNORMAL HIGH (ref 0.57–1.00)
Globulin, Total: 2.7 g/dL (ref 1.5–4.5)
Glucose: 70 mg/dL (ref 70–99)
Potassium: 3.9 mmol/L (ref 3.5–5.2)
Sodium: 141 mmol/L (ref 134–144)
Total Protein: 7.1 g/dL (ref 6.0–8.5)
eGFR: 42 mL/min/{1.73_m2} — ABNORMAL LOW (ref >59)

## 2021-10-15 LAB — PTH, INTACT AND CALCIUM: PTH: 64 pg/mL (ref 15–65)

## 2021-10-15 LAB — PHOSPHORUS: Phosphorus: 3.3 mg/dL (ref 3.0–4.3)

## 2021-10-18 DIAGNOSIS — Z6827 Body mass index (BMI) 27.0-27.9, adult: Secondary | ICD-10-CM | POA: Insufficient documentation

## 2021-10-21 ENCOUNTER — Ambulatory Visit: Payer: BC Managed Care – PPO | Admitting: Rheumatology

## 2021-10-21 DIAGNOSIS — R768 Other specified abnormal immunological findings in serum: Secondary | ICD-10-CM

## 2021-10-21 DIAGNOSIS — J4520 Mild intermittent asthma, uncomplicated: Secondary | ICD-10-CM

## 2021-10-21 DIAGNOSIS — Z8269 Family history of other diseases of the musculoskeletal system and connective tissue: Secondary | ICD-10-CM

## 2021-10-21 DIAGNOSIS — M79642 Pain in left hand: Secondary | ICD-10-CM

## 2021-10-21 DIAGNOSIS — M19072 Primary osteoarthritis, left ankle and foot: Secondary | ICD-10-CM

## 2021-10-21 DIAGNOSIS — Z8261 Family history of arthritis: Secondary | ICD-10-CM

## 2021-10-21 DIAGNOSIS — I129 Hypertensive chronic kidney disease with stage 1 through stage 4 chronic kidney disease, or unspecified chronic kidney disease: Secondary | ICD-10-CM

## 2021-10-21 DIAGNOSIS — Z8719 Personal history of other diseases of the digestive system: Secondary | ICD-10-CM

## 2021-10-22 ENCOUNTER — Other Ambulatory Visit: Payer: Self-pay | Admitting: *Deleted

## 2021-10-22 DIAGNOSIS — M79642 Pain in left hand: Secondary | ICD-10-CM

## 2021-10-22 DIAGNOSIS — Z8261 Family history of arthritis: Secondary | ICD-10-CM

## 2021-10-22 DIAGNOSIS — Z8269 Family history of other diseases of the musculoskeletal system and connective tissue: Secondary | ICD-10-CM

## 2021-10-22 DIAGNOSIS — R768 Other specified abnormal immunological findings in serum: Secondary | ICD-10-CM

## 2021-10-22 DIAGNOSIS — M79641 Pain in right hand: Secondary | ICD-10-CM

## 2021-10-25 NOTE — Progress Notes (Signed)
CBC WNL.  ESR and complements WNL.  ANA negative.

## 2021-10-25 NOTE — Progress Notes (Unsigned)
Office Visit Note  Patient: Shelby Campbell             Date of Birth: 05/23/1975           MRN: [id number]             PCP: [name], MD Referring: [name], MD Visit Date: 10/27/2021 Occupation: @GUAROCC @  Subjective:  Discuss lab work   History of Present Illness: Shelby Campbell is a 46 y.o. female with history of positive ANA and osteoarthritis.   Lab work from 10/22/21 was reviewed today in the office: ANA negative, RNP pending, dsDNA negative, complements WNL, and ESR WNL.    Activities of Daily Living:  Patient reports morning stiffness for *** {minute/hour:19697}.   Patient {ACTIONS;DENIES/REPORTS:21021675::"Denies"} nocturnal pain.  Difficulty dressing/grooming: {ACTIONS;DENIES/REPORTS:21021675::"Denies"} Difficulty climbing stairs: {ACTIONS;DENIES/REPORTS:21021675::"Denies"} Difficulty getting out of chair: {ACTIONS;DENIES/REPORTS:21021675::"Denies"} Difficulty using hands for taps, buttons, cutlery, and/or writing: {ACTIONS;DENIES/REPORTS:21021675::"Denies"}  No Rheumatology ROS completed.   PMFS History:  Patient Active Problem List   Diagnosis Date Noted  . BMI 27.0-27.9,adult 10/18/2021  . Sicca syndrome ([place]) 10/12/2020  . Body mass index (BMI) of 25.0 to 25.9 in adult 10/12/2020  . Subconjunctival hemorrhage of right eye 10/12/2020  . Chronic renal disease, stage III ([place]) 04/11/2018  . Asthma 03/01/2018  . Benign hypertension with chronic kidney disease 02/26/2018  . Chronic gastritis, mild.  H Pylori negative 07/31/2013  . Chronic cholecystitis with calculus 07/31/2013    Past Medical History:  Diagnosis Date  . Asthma   . Chronic kidney disease    stage 3 a per lov dr Cecille Rubin foster 10-01-2019 on chart  . Hiatal hernia   . History of mitral valve prolapse    no problems since birth of son 91 yrs ago (2008)  . Hydronephrosis 2021   Per patient  . Hypertension   . 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 Shortness of breath    related to  exertion    Family History  Problem Relation Age of Onset  . Hypertension Mother   . Arthritis Mother   . Hypertension Father   . Prostate cancer Father   . Arthritis Father   . Gout Father   . Healthy Son   . Healthy Daughter    Past Surgical History:  Procedure Laterality Date  . AXILLARY LYMPH NODE BIOPSY  2010   right   . CHOLECYSTECTOMY    . CYSTOSCOPY WITH RETROGRADE PYELOGRAM, URETEROSCOPY AND STENT PLACEMENT Right 10/15/2019   Procedure: CYSTOSCOPY WITH RETROGRADE PYELOGRAM, URETEROSCOPY AND STENT PLACEMENT, attempted laser of stricture;  Surgeon: Robley Fries, MD;  Location: [facility];  Service: Urology;  Laterality: Right;  1 HR  . DILITATION & CURRETTAGE/HYSTROSCOPY WITH NOVASURE ABLATION N/A 05/04/2016   Procedure: DILATATION & CURETTAGE/HYSTEROSCOPY WITH NOVASURE ABLATION;  Surgeon: Servando Salina, MD;  Location: [facility];  Service: Gynecology;  Laterality: N/A;  . LAPAROSCOPIC CHOLECYSTECTOMY SINGLE PORT N/A 09/10/2013   Procedure: LAPAROSCOPIC CHOLECYSTECTOMY SINGLE SITE WITH CHOLANGIOGRAM;  Surgeon: Adin Hector, MD;  Location: [facility];  Service: General;  Laterality: N/A;  . TUBAL LIGATION     Social History   Social History Narrative  . Not on file   Immunization History  Administered Date(s) Administered  . Influenza,inj,Quad PF,6+ Mos 03/01/2018, 04/08/2020, 03/27/2021  . Janssen (J&J) SARS-COV-2 Vaccination 08/08/2019  . Moderna Sars-Covid-2 Vaccination 04/01/2020  . Pension scheme manager 40yr & up 03/27/2021  . Td 04/12/2003  . Tdap 12/20/2010, 04/13/2021  Objective: Vital Signs: There were no vitals taken for this visit.   Physical Exam Vitals and nursing note reviewed.  Constitutional:      Appearance: She is well-developed.  HENT:     Head: Normocephalic and atraumatic.  Eyes:     Conjunctiva/sclera: Conjunctivae normal.  Cardiovascular:     Rate and Rhythm: Normal rate and regular rhythm.      Heart sounds: Normal heart sounds.  Pulmonary:     Effort: Pulmonary effort is normal.     Breath sounds: Normal breath sounds.  Abdominal:     General: Bowel sounds are normal.     Palpations: Abdomen is soft.  Musculoskeletal:     Cervical back: Normal range of motion.  Skin:    General: Skin is warm and dry.     Capillary Refill: Capillary refill takes less than 2 seconds.  Neurological:     Mental Status: She is alert and oriented to person, place, and time.  Psychiatric:        Behavior: Behavior normal.     Musculoskeletal Exam: ***  CDAI Exam: CDAI Score: -- Patient Global: --; Provider Global: -- Swollen: --; Tender: -- Joint Exam 10/27/2021   No joint exam has been documented for this visit   There is currently no information documented on the homunculus. Go to the Rheumatology activity and complete the homunculus joint exam.  Investigation: No additional findings.  Imaging: No results found.  Recent Labs: Lab Results  Component Value Date   WBC 6.0 10/22/2021   HGB 12.9 10/22/2021   PLT 256 10/22/2021   NA 141 10/13/2021   K 3.9 10/13/2021   CL 102 10/13/2021   CO2 23 10/13/2021   GLUCOSE 70 10/13/2021   BUN 27 (H) 10/13/2021   CREATININE 1.55 (H) 10/13/2021   BILITOT 0.4 10/13/2021   ALKPHOS 53 10/13/2021   AST 17 10/13/2021   ALT 13 10/13/2021   PROT 7.1 10/13/2021   ALBUMIN 4.4 10/13/2021   CALCIUM 9.4 10/13/2021   GFRAA 46 05/13/2021    Speciality Comments: No specialty comments available.  Procedures:  No procedures performed Allergies: Nsaids, Other, and Sulfa antibiotics   Assessment / Plan:     Visit Diagnoses: Positive ANA (antinuclear antibody)  Pain in both hands  Family history of rheumatoid arthritis  Family history of systemic lupus erythematosus  Primary osteoarthritis of both feet  Stage 3 chronic kidney disease, unspecified whether stage 3a or 3b CKD ([facility])  Mild intermittent asthma without complication  Benign  hypertension with chronic kidney disease  History of gastritis  Orders: No orders of the defined types were placed in this encounter.  No orders of the defined types were placed in this encounter.   Face-to-face time spent with patient was *** minutes. Greater than 50% of time was spent in counseling and coordination of care.  Follow-Up Instructions: No follow-ups on file.   Ofilia Neas, PA-C  Note - This record has been created using Dragon software.  Chart creation errors have been sought, but may not always  have been located. Such creation errors do not reflect on  the standard of medical care.

## 2021-10-27 ENCOUNTER — Encounter: Payer: Self-pay | Admitting: Physician Assistant

## 2021-10-27 ENCOUNTER — Ambulatory Visit (INDEPENDENT_AMBULATORY_CARE_PROVIDER_SITE_OTHER): Payer: BC Managed Care – PPO | Admitting: Physician Assistant

## 2021-10-27 VITALS — BP 127/82 | HR 69 | Resp 15 | Ht 65.0 in | Wt 164.0 lb

## 2021-10-27 DIAGNOSIS — R768 Other specified abnormal immunological findings in serum: Secondary | ICD-10-CM

## 2021-10-27 DIAGNOSIS — Z8719 Personal history of other diseases of the digestive system: Secondary | ICD-10-CM

## 2021-10-27 DIAGNOSIS — M79641 Pain in right hand: Secondary | ICD-10-CM

## 2021-10-27 DIAGNOSIS — Z8261 Family history of arthritis: Secondary | ICD-10-CM

## 2021-10-27 DIAGNOSIS — Z8269 Family history of other diseases of the musculoskeletal system and connective tissue: Secondary | ICD-10-CM

## 2021-10-27 DIAGNOSIS — N183 Chronic kidney disease, stage 3 unspecified: Secondary | ICD-10-CM

## 2021-10-27 DIAGNOSIS — M19072 Primary osteoarthritis, left ankle and foot: Secondary | ICD-10-CM

## 2021-10-27 DIAGNOSIS — M19071 Primary osteoarthritis, right ankle and foot: Secondary | ICD-10-CM

## 2021-10-27 DIAGNOSIS — M79642 Pain in left hand: Secondary | ICD-10-CM

## 2021-10-27 DIAGNOSIS — J4520 Mild intermittent asthma, uncomplicated: Secondary | ICD-10-CM

## 2021-10-27 DIAGNOSIS — I129 Hypertensive chronic kidney disease with stage 1 through stage 4 chronic kidney disease, or unspecified chronic kidney disease: Secondary | ICD-10-CM

## 2021-10-27 LAB — RNP ANTIBODY: Ribonucleic Protein(ENA) Antibody, IgG: 1.2 AI — AB

## 2021-10-27 LAB — CBC WITH DIFFERENTIAL/PLATELET
Absolute Monocytes: 480 cells/uL (ref 200–950)
Basophils Absolute: 30 cells/uL (ref 0–200)
Basophils Relative: 0.5 %
Eosinophils Absolute: 72 cells/uL (ref 15–500)
Eosinophils Relative: 1.2 %
HCT: 38 % (ref 35.0–45.0)
Hemoglobin: 12.9 g/dL (ref 11.7–15.5)
Lymphs Abs: 1308 cells/uL (ref 850–3900)
MCH: 32.1 pg (ref 27.0–33.0)
MCHC: 33.9 g/dL (ref 32.0–36.0)
MCV: 94.5 fL (ref 80.0–100.0)
MPV: 10.9 fL (ref 7.5–12.5)
Monocytes Relative: 8 %
Neutro Abs: 4110 cells/uL (ref 1500–7800)
Neutrophils Relative %: 68.5 %
Platelets: 256 10*3/uL (ref 140–400)
RBC: 4.02 10*6/uL (ref 3.80–5.10)
RDW: 12.3 % (ref 11.0–15.0)
Total Lymphocyte: 21.8 %
WBC: 6 10*3/uL (ref 3.8–10.8)

## 2021-10-27 LAB — C3 AND C4
C3 Complement: 139 mg/dL (ref 83–193)
C4 Complement: 41 mg/dL (ref 15–57)

## 2021-10-27 LAB — ANA: Anti Nuclear Antibody (ANA): NEGATIVE

## 2021-10-27 LAB — ANTI-DNA ANTIBODY, DOUBLE-STRANDED: ds DNA Ab: <1 IU/mL

## 2021-10-27 LAB — SEDIMENTATION RATE: Sed Rate: 11 mm/h (ref 0–20)

## 2021-10-27 NOTE — Progress Notes (Signed)
RNP remains positive but stable. dsDNA is negative.  Please notify the patient.  I discussed the rest of the results with the patient this morning in the office.

## 2021-10-27 NOTE — Patient Instructions (Signed)
Hand Exercises Hand exercises can be helpful for almost anyone. These exercises can strengthen the hands, improve flexibility and movement, and increase blood flow to the hands. These results can make work and daily tasks easier. Hand exercises can be especially helpful for people who have joint pain from arthritis or have nerve damage from overuse (carpal tunnel syndrome). These exercises can also help people who have injured a hand. Exercises Most of these hand exercises are gentle stretching and motion exercises. It is usually safe to do them often throughout the day. Warming up your hands before exercise may help to reduce stiffness. You can do this with gentle massage or by placing your hands in warm water for 10-15 minutes. It is normal to feel some stretching, pulling, tightness, or mild discomfort as you begin new exercises. This will gradually improve. Stop an exercise right away if you feel sudden, severe pain or your pain gets worse. Ask your health care provider which exercises are best for you. Knuckle bend or "claw" fist  Stand or sit with your arm, hand, and all five fingers pointed straight up. Make sure to keep your wrist straight during the exercise. Gently bend your fingers down toward your palm until the tips of your fingers are touching the top of your palm. Keep your big knuckle straight and just bend the small knuckles in your fingers. Hold this position for __________ seconds. Straighten (extend) your fingers back to the starting position. Repeat this exercise 5-10 times with each hand. Full finger fist  Stand or sit with your arm, hand, and all five fingers pointed straight up. Make sure to keep your wrist straight during the exercise. Gently bend your fingers into your palm until the tips of your fingers are touching the middle of your palm. Hold this position for __________ seconds. Extend your fingers back to the starting position, stretching every joint fully. Repeat  this exercise 5-10 times with each hand. Straight fist Stand or sit with your arm, hand, and all five fingers pointed straight up. Make sure to keep your wrist straight during the exercise. Gently bend your fingers at the big knuckle, where your fingers meet your hand, and the middle knuckle. Keep the knuckle at the tips of your fingers straight and try to touch the bottom of your palm. Hold this position for __________ seconds. Extend your fingers back to the starting position, stretching every joint fully. Repeat this exercise 5-10 times with each hand. Tabletop  Stand or sit with your arm, hand, and all five fingers pointed straight up. Make sure to keep your wrist straight during the exercise. Gently bend your fingers at the big knuckle, where your fingers meet your hand, as far down as you can while keeping the small knuckles in your fingers straight. Think of forming a tabletop with your fingers. Hold this position for __________ seconds. Extend your fingers back to the starting position, stretching every joint fully. Repeat this exercise 5-10 times with each hand. Finger spread  Place your hand flat on a table with your palm facing down. Make sure your wrist stays straight as you do this exercise. Spread your fingers and thumb apart from each other as far as you can until you feel a gentle stretch. Hold this position for __________ seconds. Bring your fingers and thumb tight together again. Hold this position for __________ seconds. Repeat this exercise 5-10 times with each hand. Making circles  Stand or sit with your arm, hand, and all five fingers pointed   straight up. Make sure to keep your wrist straight during the exercise. Make a circle by touching the tip of your thumb to the tip of your index finger. Hold for __________ seconds. Then open your hand wide. Repeat this motion with your thumb and each finger on your hand. Repeat this exercise 5-10 times with each hand. Thumb  motion  Sit with your forearm resting on a table and your wrist straight. Your thumb should be facing up toward the ceiling. Keep your fingers relaxed as you move your thumb. Lift your thumb up as high as you can toward the ceiling. Hold for __________ seconds. Bend your thumb across your palm as far as you can, reaching the tip of your thumb for the small finger (pinkie) side of your palm. Hold for __________ seconds. Repeat this exercise 5-10 times with each hand. Grip strengthening  Hold a stress ball or other soft ball in the middle of your hand. Slowly increase the pressure, squeezing the ball as much as you can without causing pain. Think of bringing the tips of your fingers into the middle of your palm. All of your finger joints should bend when doing this exercise. Hold your squeeze for __________ seconds, then relax. Repeat this exercise 5-10 times with each hand. Contact a health care provider if: Your hand pain or discomfort gets much worse when you do an exercise. Your hand pain or discomfort does not improve within 2 hours after you exercise. If you have any of these problems, stop doing these exercises right away. Do not do them again unless your health care provider says that you can. Get help right away if: You develop sudden, severe hand pain or swelling. If this happens, stop doing these exercises right away. Do not do them again unless your health care provider says that you can. This information is not intended to replace advice given to you by your health care provider. Make sure you discuss any questions you have with your health care provider. Document Revised: 08/13/2020 Document Reviewed: 08/13/2020 Elsevier Patient Education  2023 [employer]  

## 2021-11-03 ENCOUNTER — Encounter: Payer: Self-pay | Admitting: Internal Medicine

## 2021-11-04 ENCOUNTER — Other Ambulatory Visit: Payer: Self-pay | Admitting: Internal Medicine

## 2021-11-04 MED ORDER — CIPROFLOXACIN HCL 500 MG PO TABS: 500.0000 mg | ORAL_TABLET | Freq: Two times a day (BID) | ORAL | 0 refills | Status: AC

## 2021-11-19 ENCOUNTER — Other Ambulatory Visit: Payer: Self-pay | Admitting: Internal Medicine

## 2022-01-06 ENCOUNTER — Other Ambulatory Visit: Payer: Self-pay | Admitting: Internal Medicine

## 2022-01-06 ENCOUNTER — Encounter: Payer: Self-pay | Admitting: Internal Medicine

## 2022-01-06 ENCOUNTER — Ambulatory Visit: Payer: BC Managed Care – PPO | Admitting: Internal Medicine

## 2022-01-06 VITALS — BP 118/78 | HR 85 | Temp 97.7°F | Ht 65.0 in | Wt 155.0 lb

## 2022-01-06 DIAGNOSIS — J4520 Mild intermittent asthma, uncomplicated: Secondary | ICD-10-CM | POA: Diagnosis not present

## 2022-01-06 DIAGNOSIS — I129 Hypertensive chronic kidney disease with stage 1 through stage 4 chronic kidney disease, or unspecified chronic kidney disease: Secondary | ICD-10-CM

## 2022-01-06 DIAGNOSIS — M3500 Sicca syndrome, unspecified: Secondary | ICD-10-CM

## 2022-01-06 DIAGNOSIS — N1831 Chronic kidney disease, stage 3a: Secondary | ICD-10-CM

## 2022-01-06 DIAGNOSIS — R0982 Postnasal drip: Secondary | ICD-10-CM

## 2022-01-06 MED ORDER — HYDROCODONE BIT-HOMATROP MBR 5-1.5 MG/5ML PO SOLN: 5.0000 mL | Freq: Four times a day (QID) | ORAL | 0 refills | Status: DC | PRN

## 2022-01-06 MED ORDER — BENZONATATE 100 MG PO CAPS: 100.0000 mg | ORAL_CAPSULE | Freq: Three times a day (TID) | ORAL | 1 refills | Status: AC | PRN

## 2022-01-06 NOTE — Progress Notes (Signed)
I,Victoria T Hamilton,acting as a scribe for Maximino Greenland, MD.,have documented all relevant documentation on the behalf of Maximino Greenland, MD,as directed by  Maximino Greenland, MD while in the presence of Maximino Greenland, MD.    Subjective:     Patient ID: Shelby Campbell , female    DOB: 1975/05/27 , 46 y.o.   MRN: [id number]   Chief Complaint  Patient presents with   Cough    HPI  Pt presents today with a cough, initially started a week ago. No fever, no nasal congestion. She adds, yesterday her cough was more aggressive, she coughed all day. Patient has used Coricidin once, Saturday night.   Cough This is a recurrent problem. The problem has been unchanged. The cough is Productive of sputum. Associated symptoms include postnasal drip.     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[place])    Shortness of breath    related to exertion     Family History  Problem Relation Age of Onset   Hypertension Mother    Arthritis Mother    Hypertension Father    Prostate cancer Father    Arthritis Father    Gout Father    Healthy Son    Healthy Daughter      Current Outpatient Medications:    acetaminophen (TYLENOL) 500 MG tablet, Take 1,000 mg by mouth 2 (two) times daily as needed for headache (or flank pain)., Disp: , Rfl:    albuterol (VENTOLIN HFA) 108 (90 Base) MCG/ACT inhaler, Inhale 2 puffs into the lungs every 6 (six) hours as needed for wheezing or shortness of breath., Disp: 54 g, Rfl: 3   amLODipine (NORVASC) 2.5 MG tablet, Take 2.5 mg by mouth daily., Disp: , Rfl:    benzonatate (TESSALON PERLES) 100 MG capsule, Take 1 capsule (100 mg total) by mouth 3 (three) times daily as needed for cough.,  Disp: 30 capsule, Rfl: 1   cholecalciferol (VITAMIN D3) 25 MCG (1000 UNIT) tablet, Take 1,000 Units by mouth daily. , Disp: , Rfl:    famotidine (PEPCID) 20 MG tablet, Take 1 tablet (20 mg total) by mouth daily., Disp: 90 tablet, Rfl: 1   fluticasone furoate-vilanterol (BREO ELLIPTA) 200-25 MCG/ACT AEPB, INHALE 1 PUFF INTO THE LUNGS DAILY, Disp: 180 each, Rfl: 1   fluticasone furoate-vilanterol (BREO ELLIPTA) 200-25 MCG/ACT AEPB, Inhale 1 puff into the lungs daily., Disp: 60 each, Rfl: 5   HYDROcodone bit-homatropine (HYDROMET) 5-1.5 MG/5ML syrup, Take 5 mLs by mouth every 6 (six) hours as needed for cough., Disp: 120 mL, Rfl: 0   metoprolol succinate (TOPROL-XL) 50 MG 24 hr tablet, Take 1 tablet (50 mg total) by mouth daily., Disp: 90 tablet, Rfl: 2   montelukast (SINGULAIR) 10 MG tablet, Take 1 tablet (10 mg total) by mouth daily., Disp: 30 tablet, Rfl: 2   Multiple Vitamin (MULTIVITAMIN) tablet, Take 1 tablet by mouth daily., Disp: , Rfl:    Allergies  Allergen Reactions   Nsaids     And ibuprofen cannot take due to chronic kidney disease   Other Other (See Comments)  Sulfonamides, sulfonylureas, barbiturates, antifungals, ketamine, etomidate, rifapentine, rifampicin, rifabutine, nitrofurantoin, metronidazole, ergot derivatives, indinavir, nevirapine, ritonavir, saquinavir, progestogens, progesterones, carbamazepine, phenytoin, valproate, oxycodone, pentazocine, cocaine, gold antirheumatics agents, sodium aurothiomalate, methyldopa, fenfluramine, ethosuzimide, flupentixol, flutamide, disulfiram, lidocaine    Sulfa Antibiotics Other (See Comments)    Heart beating fast      Review of Systems  Constitutional: Negative.   HENT:  Positive for postnasal drip.   Respiratory:  Positive for cough.   Cardiovascular: Negative.   Neurological: Negative.   Psychiatric/Behavioral: Negative.       Today's Vitals   01/06/22 1429  BP: 118/78  Pulse: 85  Temp: 97.7 F (36.5 C)  SpO2: 98%   Weight: 155 lb (70.3 kg)  Height: '5\' 5"'$  (1.651 m)  PainSc: 0-No pain   Body mass index is 25.79 kg/m.  Wt Readings from Last 3 Encounters:  01/06/22 155 lb (70.3 kg)  10/27/21 164 lb (74.4 kg)  10/13/21 160 lb 6.4 oz (72.8 kg)    Objective:  Physical Exam Vitals and nursing note reviewed.  Constitutional:      Appearance: Normal appearance.  HENT:     Head: Normocephalic and atraumatic.     Right Ear: Tympanic membrane, ear canal and external ear normal. There is no impacted cerumen.     Left Ear: Tympanic membrane, ear canal and external ear normal. There is no impacted cerumen.  Eyes:     Extraocular Movements: Extraocular movements intact.  Cardiovascular:     Rate and Rhythm: Normal rate and regular rhythm.     Heart sounds: Normal heart sounds.  Pulmonary:     Effort: Pulmonary effort is normal.     Breath sounds: Normal breath sounds.  Musculoskeletal:     Cervical back: Normal range of motion.  Skin:    General: Skin is warm.  Neurological:     General: No focal deficit present.     Mental Status: She is alert.  Psychiatric:        Mood and Affect: Mood normal.        Behavior: Behavior normal.         Assessment And Plan:     1. Postnasal drip Comments: She is encouraged to avoid dairy, c/w montelukast. I will send rx Tessalon perles and hydromet syrup to use nightly prn. She agrees to COVID testing.  - Novel Coronavirus, NAA ([facility])  2. Benign hypertension with chronic kidney disease Comments: Chronic, well controlled. She will c/w amlodipine 2.'5mg'$  and metoprolol XL '50mg'$  daily.   3. Stage 3a chronic kidney disease (HCC) Comments: Chronic, she is reminded to avoid NSAIDs, stay hydrated and keep Bp well controlled to decrease risk of CKD progression.   4. Mild intermittent asthma without complication Comments: Chronic, stable.  There is no wheezing on exam.    Patient was given opportunity to ask questions. Patient verbalized understanding of the  plan and was able to repeat key elements of the plan. All questions were answered to their satisfaction.   I, Maximino Greenland, MD, have reviewed all documentation for this visit. The documentation on 01/06/22 for the exam, diagnosis, procedures, and orders are all accurate and complete.   IF YOU HAVE BEEN REFERRED TO A SPECIALIST, IT MAY TAKE 1-2 WEEKS TO SCHEDULE/PROCESS THE REFERRAL. IF YOU HAVE NOT HEARD FROM US/SPECIALIST IN TWO WEEKS, PLEASE GIVE [place] A CALL AT [phone].   THE PATIENT IS ENCOURAGED TO PRACTICE SOCIAL DISTANCING DUE TO THE COVID-19 PANDEMIC.

## 2022-01-06 NOTE — Patient Instructions (Signed)
Postnasal Drip Postnasal drip is the feeling of mucus going down the back of your throat. Mucus is a slimy substance that moistens and cleans your nose and throat, as well as the air pockets in face bones near your forehead and cheeks (sinuses). Small amounts of mucus pass from your nose and sinuses down the back of your throat all the time. This is normal. When you produce too much mucus or the mucus gets too thick, you can feel it. Some common causes of postnasal drip include: Having more mucus because of: A cold or the flu. Allergies. Cold air. Certain medicines. Gastroesophageal reflux. Having more mucus that is thicker because of: A sinus or nasal infection. Dry air. A food allergy. Follow these instructions at home: Relieving discomfort  Gargle with a mixture of salt and water 3-4 times a day or as needed. To make salt water, completely dissolve -1 tsp (3-6 g) of salt in 1 cup (237 mL) of warm water. If the air in your home is dry, use a humidifier to add moisture to the air. Use a saline spray or a container (neti pot) to flush out the nose (nasal irrigation). These methods can help clear away mucus and keep the nasal passages moist. General instructions Take over-the-counter and prescription medicines only as told by your health care provider. Follow instructions from your health care provider about eating or drinking restrictions. You may need to avoid caffeine. Avoid things that you know you are allergic to (allergens), like dust, mold, pollen, pets, or certain foods. Drink enough fluid to keep your urine pale yellow. Keep all follow-up visits. This is important. Contact a health care provider if: You have a fever. You have a sore throat or difficulty swallowing. You have a headache. You have sinus or ear pain. You have a cough that does not go away. The mucus from your nose becomes thick and is green or yellow in color. You have cold or flu symptoms that last more than 10  days. Summary Postnasal drip is the feeling of mucus going down the back of your throat. Use nasal irrigation or a nasal spray to help clear away mucus and keep the nasal passages moist. Avoid things that you know you are allergic to (allergens), like dust, mold, pollen, pets, or certain foods. This information is not intended to replace advice given to you by your health care provider. Make sure you discuss any questions you have with your health care provider. Document Revised: 03/25/2021 Document Reviewed: 03/25/2021 Elsevier Patient Education  2023 Elsevier Inc.  

## 2022-01-07 LAB — NOVEL CORONAVIRUS, NAA: SARS-CoV-2, NAA: NOT DETECTED

## 2022-01-09 ENCOUNTER — Other Ambulatory Visit: Payer: Self-pay | Admitting: Internal Medicine

## 2022-02-11 LAB — HM MAMMOGRAPHY

## 2022-03-28 LAB — COMPREHENSIVE METABOLIC PANEL
Albumin: 4.5 (ref 3.5–5.0)
Calcium: 9.8 (ref 8.7–10.7)
eGFR: 41

## 2022-03-28 LAB — BASIC METABOLIC PANEL
BUN: 22 — AB (ref 4–21)
CO2: 27 — AB (ref 13–22)
Chloride: 99 (ref 99–108)
Creatinine: 1.6 — AB (ref 0.5–1.1)
Glucose: 90
Potassium: 3.5 mEq/L (ref 3.5–5.1)
Sodium: 137 (ref 137–147)

## 2022-03-28 LAB — VITAMIN D 25 HYDROXY (VIT D DEFICIENCY, FRACTURES): Vit D, 25-Hydroxy: 46.2

## 2022-04-14 ENCOUNTER — Other Ambulatory Visit: Payer: Self-pay | Admitting: Internal Medicine

## 2022-04-18 NOTE — Progress Notes (Deleted)
Office Visit Note  Patient: Shelby Campbell             Date of Birth: 02/15/1976           MRN: [id number]             PCP: [name], MD Referring: [name], MD Visit Date: 04/28/2022 Occupation: '@GUAROCC'$ @  Subjective:  No chief complaint on file.   History of Present Illness: Shelby Campbell is a 46 y.o. female ***   Activities of Daily Living:  Patient reports morning stiffness for *** {minute/hour:19697}.   Patient {ACTIONS;DENIES/REPORTS:21021675::"Denies"} nocturnal pain.  Difficulty dressing/grooming: {ACTIONS;DENIES/REPORTS:21021675::"Denies"} Difficulty climbing stairs: {ACTIONS;DENIES/REPORTS:21021675::"Denies"} Difficulty getting out of chair: {ACTIONS;DENIES/REPORTS:21021675::"Denies"} Difficulty using hands for taps, buttons, cutlery, and/or writing: {ACTIONS;DENIES/REPORTS:21021675::"Denies"}  No Rheumatology ROS completed.   PMFS History:  Patient Active Problem List   Diagnosis Date Noted   BMI 27.0-27.9,adult 10/18/2021   Sicca syndrome ([place]) 10/12/2020   Body mass index (BMI) of 25.0 to 25.9 in adult 10/12/2020   Subconjunctival hemorrhage of right eye 10/12/2020   Chronic renal disease, stage III ([facility]) 04/11/2018   Asthma 03/01/2018   Benign hypertension with chronic kidney disease 02/26/2018   Chronic gastritis, mild.  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[place])    Shortness of breath    related to exertion    Family History  Problem Relation Age of Onset   Hypertension Mother    Arthritis Mother    Hypertension Father    Prostate cancer Father    Arthritis Father    Gout  Father    Healthy Son    Healthy Daughter    Past Surgical History:  Procedure Laterality Date   AXILLARY LYMPH NODE BIOPSY  2010   right    CHOLECYSTECTOMY     CYSTOSCOPY WITH RETROGRADE PYELOGRAM, URETEROSCOPY AND STENT PLACEMENT Right 10/15/2019   Procedure: CYSTOSCOPY WITH RETROGRADE PYELOGRAM, URETEROSCOPY AND STENT PLACEMENT, attempted laser of stricture;  Surgeon: Robley Fries, MD;  Location: [facility];  Service: Urology;  Laterality: Right;  1 HR   DILITATION & CURRETTAGE/HYSTROSCOPY WITH NOVASURE ABLATION N/A 05/04/2016   Procedure: DILATATION & CURETTAGE/HYSTEROSCOPY WITH NOVASURE ABLATION;  Surgeon: Servando Salina, MD;  Location: [facility];  Service: Gynecology;  Laterality: N/A;   LAPAROSCOPIC CHOLECYSTECTOMY SINGLE PORT N/A 09/10/2013   Procedure: LAPAROSCOPIC CHOLECYSTECTOMY SINGLE SITE WITH CHOLANGIOGRAM;  Surgeon: Adin Hector, MD;  Location: [facility];  Service: General;  Laterality: N/A;   TUBAL LIGATION     Social History   Social History Narrative   Not on file   Immunization History  Administered Date(s) Administered   Influenza,inj,Quad PF,6+ Mos 03/01/2018, 04/08/2020, 03/27/2021   Janssen (J&J) SARS-COV-2 Vaccination 08/08/2019   Moderna Sars-Covid-2 Vaccination 04/01/2020   Pfizer Covid-19 Vaccine Bivalent Booster 32yr & up 03/27/2021   Td 04/12/2003   Tdap 12/20/2010, 04/13/2021     Objective: Vital Signs: There were no vitals taken for this visit.   Physical Exam   Musculoskeletal Exam: ***  CDAI Exam: CDAI  Score: -- Patient Global: --; Provider Global: -- Swollen: --; Tender: -- Joint Exam 04/28/2022   No joint exam has been documented for this visit   There is currently no information documented on the homunculus. Go to the Rheumatology activity and complete the homunculus joint exam.  Investigation: No additional findings.  Imaging: No results found.  Recent Labs: Lab Results  Component Value Date   WBC 6.0  10/22/2021   HGB 12.9 10/22/2021   PLT 256 10/22/2021   NA 141 10/13/2021   K 3.9 10/13/2021   CL 102 10/13/2021   CO2 23 10/13/2021   GLUCOSE 70 10/13/2021   BUN 27 (H) 10/13/2021   CREATININE 1.55 (H) 10/13/2021   BILITOT 0.4 10/13/2021   ALKPHOS 53 10/13/2021   AST 17 10/13/2021   ALT 13 10/13/2021   PROT 7.1 10/13/2021   ALBUMIN 4.4 10/13/2021   CALCIUM 9.4 10/13/2021   GFRAA 46 05/13/2021    Speciality Comments: No specialty comments available.  Procedures:  No procedures performed Allergies: Nsaids, Other, and Sulfa antibiotics   Assessment / Plan:     Visit Diagnoses: No diagnosis found.  Orders: No orders of the defined types were placed in this encounter.  No orders of the defined types were placed in this encounter.   Face-to-face time spent with patient was *** minutes. Greater than 50% of time was spent in counseling and coordination of care.  Follow-Up Instructions: No follow-ups on file.   Earnestine Mealing, CMA  Note - This record has been created using Editor, commissioning.  Chart creation errors have been sought, but may not always  have been located. Such creation errors do not reflect on  the standard of medical care.

## 2022-04-19 ENCOUNTER — Other Ambulatory Visit: Payer: BC Managed Care – PPO

## 2022-04-19 ENCOUNTER — Encounter: Payer: Self-pay | Admitting: Internal Medicine

## 2022-04-19 ENCOUNTER — Ambulatory Visit (INDEPENDENT_AMBULATORY_CARE_PROVIDER_SITE_OTHER): Payer: BC Managed Care – PPO | Admitting: Internal Medicine

## 2022-04-19 VITALS — BP 132/80 | HR 73 | Temp 98.5°F | Ht 64.4 in | Wt 160.6 lb

## 2022-04-19 DIAGNOSIS — Z6827 Body mass index (BMI) 27.0-27.9, adult: Secondary | ICD-10-CM | POA: Diagnosis not present

## 2022-04-19 DIAGNOSIS — N1831 Chronic kidney disease, stage 3a: Secondary | ICD-10-CM

## 2022-04-19 DIAGNOSIS — Z0000 Encounter for general adult medical examination without abnormal findings: Secondary | ICD-10-CM | POA: Diagnosis not present

## 2022-04-19 DIAGNOSIS — Z1211 Encounter for screening for malignant neoplasm of colon: Secondary | ICD-10-CM

## 2022-04-19 DIAGNOSIS — I129 Hypertensive chronic kidney disease with stage 1 through stage 4 chronic kidney disease, or unspecified chronic kidney disease: Secondary | ICD-10-CM

## 2022-04-19 DIAGNOSIS — Z114 Encounter for screening for human immunodeficiency virus [HIV]: Secondary | ICD-10-CM

## 2022-04-19 LAB — POCT URINALYSIS DIPSTICK
Bilirubin, UA: NEGATIVE
Blood, UA: NEGATIVE
Glucose, UA: NEGATIVE
Ketones, UA: NEGATIVE
Leukocytes, UA: NEGATIVE
Nitrite, UA: NEGATIVE
Protein, UA: NEGATIVE
Spec Grav, UA: 1.025 (ref 1.010–1.025)
Urobilinogen, UA: 0.2 E.U./dL
pH, UA: 5.5 (ref 5.0–8.0)

## 2022-04-19 MED ORDER — METOPROLOL SUCCINATE ER 50 MG PO TB24: 50.0000 mg | ORAL_TABLET | Freq: Every day | ORAL | 2 refills | Status: DC

## 2022-04-19 MED ORDER — ALBUTEROL SULFATE HFA 108 (90 BASE) MCG/ACT IN AERS: 2.0000 | INHALATION_SPRAY | Freq: Four times a day (QID) | RESPIRATORY_TRACT | 3 refills | Status: AC | PRN

## 2022-04-19 MED ORDER — MONTELUKAST SODIUM 10 MG PO TABS: ORAL_TABLET | ORAL | 2 refills | Status: DC

## 2022-04-19 MED ORDER — FLUTICASONE FUROATE-VILANTEROL 200-25 MCG/ACT IN AEPB: 1.0000 | INHALATION_SPRAY | Freq: Every day | RESPIRATORY_TRACT | 2 refills | Status: DC

## 2022-04-19 NOTE — Patient Instructions (Signed)

## 2022-04-19 NOTE — Progress Notes (Signed)
Rich Brave Llittleton,acting as a Education administrator for Maximino Greenland, MD.,have documented all relevant documentation on the behalf of Maximino Greenland, MD,as directed by  Maximino Greenland, MD while in the presence of Maximino Greenland, MD.   Subjective:     Patient ID: Shelby Campbell , female    DOB: 1975-10-11 , 46 y.o.   MRN: [id number]   Chief Complaint  Patient presents with  . Annual Exam    HPI  The patient is here for a physical examination. She is followed by Dr. Garwin Brothers for her pap smears. She was last seen Nov 2023. She reports compliance with meds. Denies headaches, chest pain and shortness of breath.   Hypertension This is a chronic problem. The current episode started more than 1 year ago. The problem has been gradually improving since onset. The problem is uncontrolled. Pertinent negatives include no blurred vision. The current treatment provides moderate improvement. Hypertensive end-organ damage includes kidney disease.     Past Medical History:  Diagnosis Date  . Asthma   . Chronic kidney disease    stage 3 a per lov dr Cecille Rubin foster 10-01-2019 on chart  . Hiatal hernia   . History of mitral valve prolapse    no problems since birth of son 63 yrs ago (2008)  . Hydronephrosis 2021   Per patient  . Hypertension   . 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 Shortness of breath    related to exertion     Family History  Problem Relation Age of Onset  . Hypertension Mother   . Arthritis Mother   . Hypertension Father   . Prostate cancer Father   . Arthritis Father   . Gout Father   . Healthy Son   . Healthy Daughter      Current Outpatient Medications:  .  acetaminophen (TYLENOL) 500 MG tablet, Take 1,000 mg by mouth 2 (two) times daily as needed for headache (or flank pain)., Disp: , Rfl:  .  albuterol (VENTOLIN HFA) 108 (90 Base) MCG/ACT inhaler, Inhale 2 puffs into the lungs every 6 (six) hours as needed for wheezing or shortness of breath., Disp: 54 g, Rfl: 3 .   amLODipine (NORVASC) 2.5 MG tablet, Take 2.5 mg by mouth daily., Disp: , Rfl:  .  benzonatate (TESSALON PERLES) 100 MG capsule, Take 1 capsule (100 mg total) by mouth 3 (three) times daily as needed for cough., Disp: 30 capsule, Rfl: 1 .  cholecalciferol (VITAMIN D3) 25 MCG (1000 UNIT) tablet, Take 1,000 Units by mouth daily. , Disp: , Rfl:  .  famotidine (PEPCID) 20 MG tablet, Take 1 tablet (20 mg total) by mouth daily., Disp: 90 tablet, Rfl: 1 .  fluticasone furoate-vilanterol (BREO ELLIPTA) 200-25 MCG/ACT AEPB, INHALE 1 PUFF INTO THE LUNGS DAILY, Disp: 180 each, Rfl: 1 .  metoprolol succinate (TOPROL-XL) 50 MG 24 hr tablet, Take 1 tablet (50 mg total) by mouth daily., Disp: 90 tablet, Rfl: 2 .  montelukast (SINGULAIR) 10 MG tablet, TAKE 1 TABLET(10 MG) BY MOUTH DAILY, Disp: 30 tablet, Rfl: 2 .  Multiple Vitamin (MULTIVITAMIN) tablet, Take 1 tablet by mouth daily., Disp: , Rfl:    Allergies  Allergen Reactions  . Nsaids     And ibuprofen cannot take due to chronic kidney disease  . Other Other (See Comments)    Sulfonamides, sulfonylureas, barbiturates, antifungals, ketamine, etomidate, rifapentine, rifampicin, rifabutine, nitrofurantoin, metronidazole, ergot derivatives, indinavir, nevirapine, ritonavir, saquinavir, progestogens, progesterones, carbamazepine, phenytoin, valproate,  oxycodone, pentazocine, cocaine, gold antirheumatics agents, sodium aurothiomalate, methyldopa, fenfluramine, ethosuzimide, flupentixol, flutamide, disulfiram, lidocaine   . Sulfa Antibiotics Other (See Comments)    Heart beating fast       The patient states she uses {contraceptive methods:5051} for birth control. Last LMP was No LMP recorded. Patient has had an ablation.. {Dysmenorrhea-menorrhagia:21918}. Negative for: breast discharge, breast lump(s), breast pain and breast self exam. Associated symptoms include abnormal vaginal bleeding. Pertinent negatives include abnormal bleeding (hematology), anxiety,  decreased libido, depression, difficulty falling sleep, dyspareunia, history of infertility, nocturia, sexual dysfunction, sleep disturbances, urinary incontinence, urinary urgency, vaginal discharge and vaginal itching. Diet regular.The patient states her exercise level is    . The patient's tobacco use is:  Social History   Tobacco Use  Smoking Status Never  Smokeless Tobacco Never  . She has been exposed to passive smoke. The patient's alcohol use is:  Social History   Substance and Sexual Activity  Alcohol Use Yes   Comment: occasional  . Additional information: Last pap ***, next one scheduled for ***.    Review of Systems  Constitutional: Negative.   HENT: Negative.    Eyes: Negative.  Negative for blurred vision.  Respiratory: Negative.    Cardiovascular: Negative.   Endocrine: Negative.   Genitourinary: Negative.   Musculoskeletal: Negative.   Skin: Negative.   Neurological: Negative.   Hematological: Negative.   Psychiatric/Behavioral: Negative.       Today's Vitals   04/19/22 1518  Temp: 98.5 F (36.9 C)  Weight: 160 lb 9.6 oz (72.8 kg)  Height: 5' 4.4" (1.636 m)  PainSc: 0-No pain   Body mass index is 27.23 kg/m.  Wt Readings from Last 3 Encounters:  04/19/22 160 lb 9.6 oz (72.8 kg)  01/06/22 155 lb (70.3 kg)  10/27/21 164 lb (74.4 kg)     Objective:  Physical Exam      Assessment And Plan:     1. Encounter for general adult medical examination w/o abnormal findings  2. Benign hypertension with chronic kidney disease - POCT Urinalysis Dipstick (81002) - Microalbumin / Creatinine Urine Ratio - EKG 12-Lead  3. Stage 3a chronic kidney disease ([place])  4. BMI 27.0-27.9,adult     Patient was given opportunity to ask questions. Patient verbalized understanding of the plan and was able to repeat key elements of the plan. All questions were answered to their satisfaction.   Sheppard Evens [place], CMA   I, [place], CMA, have reviewed all  documentation for this visit. The documentation on 04/19/22 for the exam, diagnosis, procedures, and orders are all accurate and complete.   THE PATIENT IS ENCOURAGED TO PRACTICE SOCIAL DISTANCING DUE TO THE COVID-19 PANDEMIC.

## 2022-04-20 LAB — HEPATIC FUNCTION PANEL
ALT: 13 IU/L (ref 0–32)
AST: 19 IU/L (ref 0–40)
Albumin: 4.6 g/dL (ref 3.9–4.9)
Alkaline Phosphatase: 58 IU/L (ref 44–121)
Bilirubin Total: 0.4 mg/dL (ref 0.0–1.2)
Bilirubin, Direct: 0.14 mg/dL (ref 0.00–0.40)
Total Protein: 7.6 g/dL (ref 6.0–8.5)

## 2022-04-20 LAB — LIPID PANEL
Chol/HDL Ratio: 2.8 ratio (ref 0.0–4.4)
Cholesterol, Total: 171 mg/dL (ref 100–199)
HDL: 62 mg/dL (ref >39)
LDL Chol Calc (NIH): 92 mg/dL (ref 0–99)
Triglycerides: 95 mg/dL (ref 0–149)
VLDL Cholesterol Cal: 17 mg/dL (ref 5–40)

## 2022-04-20 LAB — MICROALBUMIN / CREATININE URINE RATIO
Creatinine, Urine: 189.8 mg/dL
Microalb/Creat Ratio: 4 mg/g creat (ref 0–29)
Microalbumin, Urine: 8.5 ug/mL

## 2022-04-20 LAB — CBC
Hematocrit: 39.7 % (ref 34.0–46.6)
Hemoglobin: 12.8 g/dL (ref 11.1–15.9)
MCH: 30.2 pg (ref 26.6–33.0)
MCHC: 32.2 g/dL (ref 31.5–35.7)
MCV: 94 fL (ref 79–97)
Platelets: 292 10*3/uL (ref 150–450)
RBC: 4.24 x10E6/uL (ref 3.77–5.28)
RDW: 13.1 % (ref 11.7–15.4)
WBC: 4.8 10*3/uL (ref 3.4–10.8)

## 2022-04-28 ENCOUNTER — Ambulatory Visit: Payer: BC Managed Care – PPO | Admitting: Rheumatology

## 2022-04-28 DIAGNOSIS — Z8261 Family history of arthritis: Secondary | ICD-10-CM

## 2022-04-28 DIAGNOSIS — J4520 Mild intermittent asthma, uncomplicated: Secondary | ICD-10-CM

## 2022-04-28 DIAGNOSIS — Z8269 Family history of other diseases of the musculoskeletal system and connective tissue: Secondary | ICD-10-CM

## 2022-04-28 DIAGNOSIS — Z8719 Personal history of other diseases of the digestive system: Secondary | ICD-10-CM

## 2022-04-28 DIAGNOSIS — M19071 Primary osteoarthritis, right ankle and foot: Secondary | ICD-10-CM

## 2022-04-28 DIAGNOSIS — I129 Hypertensive chronic kidney disease with stage 1 through stage 4 chronic kidney disease, or unspecified chronic kidney disease: Secondary | ICD-10-CM

## 2022-04-28 DIAGNOSIS — R768 Other specified abnormal immunological findings in serum: Secondary | ICD-10-CM

## 2022-04-28 DIAGNOSIS — N183 Chronic kidney disease, stage 3 unspecified: Secondary | ICD-10-CM

## 2022-04-28 DIAGNOSIS — M79641 Pain in right hand: Secondary | ICD-10-CM

## 2022-04-29 ENCOUNTER — Encounter: Payer: Self-pay | Admitting: Internal Medicine

## 2022-05-06 ENCOUNTER — Other Ambulatory Visit: Payer: Self-pay | Admitting: Internal Medicine

## 2022-05-11 ENCOUNTER — Encounter: Payer: Self-pay | Admitting: Internal Medicine

## 2022-08-12 NOTE — Progress Notes (Signed)
Office Visit Note  Patient: Shelby Campbell             Date of Birth: 12/11/1975           MRN: [id number]             PCP: Dorothyann Peng, MD Referring: Dorothyann Peng, MD Visit Date: 08/22/2022 Occupation: @GUAROCC @  Subjective:  Arthralgias   History of Present Illness: Shelby Campbell is a 47 y.o. female with history of positive ANA and osteoarthritis.  Patient reports that she experiences intermittent discomfort and stiffness in both knees especially her left knee.  She has been evaluated by sports medicine and has been diagnosed with arthritis in both knees.  At times she has a locking sensation in the left knee typically when transitioning from a seated to standing position.  She has not noticed any joint swelling.  She uses Voltaren gel topically as needed for pain relief.  At times she has tenderness in the right fourth digit but denies any inflammation.  She states that she had an exercise related injury about 6 weeks ago at which time she sprained her right wrist and injured her right elbow.  She was evaluated by sports medicine at that time.  She plans on following up since her symptoms have persisted.   She has intermittent symptoms of Raynaud's phenomenon in her fingertips and toes.  She denies any digital ulcers.  She denies any sores in her mouth or nose.  She denies any facial rashes or hair loss.  She denies any swollen lymph nodes.  Overall her energy level has been stable.     Activities of Daily Living:  Patient reports morning stiffness for 0 minute.   Patient Denies nocturnal pain.  Difficulty dressing/grooming: Denies Difficulty climbing stairs: Denies Difficulty getting out of chair: Denies Difficulty using hands for taps, buttons, cutlery, and/or writing: Denies  Review of Systems  Constitutional:  Negative for fatigue.  HENT:  Negative for mouth sores and mouth dryness.   Eyes:  Negative for dryness.  Respiratory:  Negative for shortness of breath.    Cardiovascular:  Negative for chest pain and palpitations.  Gastrointestinal:  Negative for blood in stool, constipation and diarrhea.  Endocrine: Negative for increased urination.  Genitourinary:  Negative for involuntary urination.  Musculoskeletal:  Positive for joint pain and joint pain. Negative for gait problem, joint swelling, myalgias, muscle weakness, morning stiffness, muscle tenderness and myalgias.  Skin:  Positive for color change. Negative for rash, hair loss and sensitivity to sunlight.  Allergic/Immunologic: Negative for susceptible to infections.  Neurological:  Negative for dizziness and headaches.  Hematological:  Negative for swollen glands.  Psychiatric/Behavioral:  Negative for depressed mood and sleep disturbance. The patient is not nervous/anxious.     PMFS History:  Patient Active Problem List   Diagnosis Date Noted   BMI 27.0-27.9,adult 10/18/2021   Body mass index (BMI) of 25.0 to 25.9 in adult 10/12/2020   Subconjunctival hemorrhage of right eye 10/12/2020   Chronic renal disease, stage III 04/11/2018   Asthma 03/01/2018   Benign hypertension with chronic kidney disease 02/26/2018   Chronic gastritis, mild.  H Pylori negative 07/31/2013   Chronic cholecystitis with calculus 07/31/2013    Past Medical History:  Diagnosis Date   Asthma    Chronic kidney disease    stage 3 a per lov dr Lawson [facility]cal [facility] 10-01-2019 on chart   Hiatal hernia    History of mitral valve prolapse    no  problems since birth of son 13 yrs ago (2008)   Hydronephrosis 2021   Per patient   Hypertension    Porphyria    Porphyria    Shortness of breath    related to exertion    Family History  Problem Relation Age of Onset   Hypertension Mother    Arthritis Mother    Hypertension Father    Prostate cancer Father    Arthritis Father    Gout Father    Healthy Son    Healthy Daughter    Past Surgical History:  Procedure Laterality Date   AXILLARY LYMPH NODE BIOPSY  2010    right    CHOLECYSTECTOMY     CYSTOSCOPY WITH RETROGRADE PYELOGRAM, URETEROSCOPY AND STENT PLACEMENT Right 10/15/2019   Procedure: CYSTOSCOPY WITH RETROGRADE PYELOGRAM, URETEROSCOPY AND STENT PLACEMENT, attempted laser of stricture;  Surgeon: Noel Christmas, MD;  Location: [facility] [place];  Service: Urology;  Laterality: Right;  1 HR   DILITATION & CURRETTAGE/HYSTROSCOPY WITH NOVASURE ABLATION N/A 05/04/2016   Procedure: DILATATION & CURETTAGE/HYSTEROSCOPY WITH NOVASURE ABLATION;  Surgeon: Maxie Better, MD;  Location: [facility];  Service: Gynecology;  Laterality: N/A;   LAPAROSCOPIC CHOLECYSTECTOMY SINGLE PORT N/A 09/10/2013   Procedure: LAPAROSCOPIC CHOLECYSTECTOMY SINGLE SITE WITH CHOLANGIOGRAM;  Surgeon: Ardeth Sportsman, MD;  Location: [facility];  Service: General;  Laterality: N/A;   TUBAL LIGATION     Social History   Social History Narrative   Not on file   Immunization History  Administered Date(s) Administered   Influenza,inj,Quad PF,6+ Mos 03/01/2018, 04/08/2020, 03/27/2021   Influenza-Unspecified 03/28/2022   Janssen (J&J) SARS-COV-2 Vaccination 08/08/2019   Moderna Sars-Covid-2 Vaccination 04/01/2020   Pfizer Covid-19 Vaccine Bivalent Booster 52yrs & up 03/27/2021   Td 04/12/2003   Tdap 12/20/2010, 04/13/2021     Objective: Vital Signs: BP 132/83 (BP Location: Left Arm, Patient Position: Sitting, Cuff Size: Normal)   Pulse (!) 57   Resp 14   Ht 5\' 4"  (1.626 m)   Wt 163 lb (73.9 kg)   BMI 27.98 kg/m    Physical Exam Vitals and nursing note reviewed.  Constitutional:      Appearance: She is well-developed.  HENT:     Head: Normocephalic and atraumatic.  Eyes:     Conjunctiva/sclera: Conjunctivae normal.  Cardiovascular:     Rate and Rhythm: Normal rate and regular rhythm.     Heart sounds: Normal heart sounds.  Pulmonary:     Effort: Pulmonary effort is normal.     Breath sounds: Normal breath sounds.  Abdominal:     General: Bowel sounds are  normal.     Palpations: Abdomen is soft.  Musculoskeletal:     Cervical back: Normal range of motion.  Lymphadenopathy:     Cervical: No cervical adenopathy.  Skin:    General: Skin is warm and dry.     Capillary Refill: Capillary refill takes less than 2 seconds.     Comments: No Malar rash. No digital ulcerations or signs of gangrene. No signs of sclerodactyly.  Neurological:     Mental Status: She is alert and oriented to person, place, and time.  Psychiatric:        Behavior: Behavior normal.      Musculoskeletal Exam: C-spine, thoracic spine, lumbar spine have good range of motion.  Shoulder joints, elbow joints, wrist joints, MCPs, PIPs, DIPs have good range of motion with no synovitis.  Complete fist formation bilaterally.  Hip joints have good range of motion  with no groin pain.  Knee joints have good range of motion with no warmth or effusion.  Ankle joints have good range of motion with no tenderness or joint swelling.  No evidence of Achilles tendinitis or plantar fasciitis today.  Some tenderness over the first MTP joint bilaterally.  CDAI Exam: CDAI Score: -- Patient Global: --; Provider Global: -- Swollen: --; Tender: -- Joint Exam 08/22/2022   No joint exam has been documented for this visit   There is currently no information documented on the homunculus. Go to the Rheumatology activity and complete the homunculus joint exam.  Investigation: No additional findings.  Imaging: No results found.  Recent Labs: Lab Results  Component Value Date   WBC 4.8 04/19/2022   HGB 12.8 04/19/2022   PLT 292 04/19/2022   NA 137 03/28/2022   K 3.5 03/28/2022   CL 99 03/28/2022   CO2 27 (A) 03/28/2022   GLUCOSE 70 10/13/2021   BUN 22 (A) 03/28/2022   CREATININE 1.6 (A) 03/28/2022   BILITOT 0.4 04/19/2022   ALKPHOS 58 04/19/2022   AST 19 04/19/2022   ALT 13 04/19/2022   PROT 7.6 04/19/2022   ALBUMIN 4.6 04/19/2022   CALCIUM 9.8 03/28/2022   GFRAA 46 05/13/2021     Speciality Comments: No specialty comments available.  Procedures:  No procedures performed Allergies: Nsaids, Other, and Sulfa antibiotics   Assessment / Plan:     Visit Diagnoses: Positive ANA (antinuclear antibody) - ANA 1: 80 speckled, RF positive, C3-C4 normal, family history of systemic lupus and rheumatoid arthritis: Patient continues to have intermittent arthralgias and joint stiffness.  She is not on any immunosuppressive agents at this time.  She has no synovitis on examination today.  She has an intermittent symptoms of Raynaud's phenomenon in her fingertips and toes but no digital ulcerations or signs of sclerodactyly today.  No Maller rash was noted.  She has not had any sores in her mouth or nose.  No cervical lymphadenopathy.  No sicca symptoms.  No parotid swelling or tenderness noted.  Her energy level has been stable overall. Lab work from 10/22/21 was reviewed today in the office: ANA negative, RNP+, dsDNA-, complements WNL, ESR WNL.  The following lab work will be obtained today for further evaluation.  She does not currently require immunosuppressive therapy.  She was advised to notify us if she develops any new or worsening symptoms.  She will follow-up in the office in 6 months or sooner if needed.  - Plan: Protein / creatinine ratio, urine, CBC with Differential/Platelet, COMPLETE METABOLIC PANEL WITH GFR, ANA, C3 and C4, Anti-DNA antibody, double-stranded, Sedimentation rate, RNP Antibody  Pain in both hands: She has intermittent tenderness of the right 4th digit.  No tenderness or synovitis noted today.  Complete fist formation noted bilaterally.  Discussed the importance of joint protection and muscle strengthening.   Chronic pain of both knees: She is intermittent is comfort and stiffness in both knee joints especially the left knee.  Evaluated by sports medicine past.  Using voltaren gel topically as needed.  Good range of motion of both knee joints on examination  today.  No warmth or effusion noted.  Primary osteoarthritis of both feet: She has intermittent soreness in both feet.  She wears orthotics and proper fitting shoes.    Other medical conditions are listed as follows:  Family history of rheumatoid arthritis - Mother  Family history of systemic lupus erythematosus - Cousin  Stage 3 chronic kidney disease, unspecified  whether stage 3a or 3b CKD  Mild intermittent asthma without complication  Benign hypertension with chronic kidney disease: Blood pressure was 132/83 today in the office.  History of gastritis  Orders: Orders Placed This Encounter  Procedures   Protein / creatinine ratio, urine   CBC with Differential/Platelet   COMPLETE METABOLIC PANEL WITH GFR   ANA   C3 and C4   Anti-DNA antibody, double-stranded   Sedimentation rate   RNP Antibody   No orders of the defined types were placed in this encounter.  Follow-Up Instructions: Return in about 6 months (around 02/21/2023) for +ANA, Osteoarthritis.   Gearldine Bienenstockaylor M Sabena Winner, PA-C  Note - This record has been created using Dragon software.  Chart creation errors have been sought, but may not always  have been located. Such creation errors do not reflect on  the standard of medical care.

## 2022-08-19 LAB — HM COLONOSCOPY

## 2022-08-22 ENCOUNTER — Ambulatory Visit: Payer: BC Managed Care – PPO | Attending: Rheumatology | Admitting: Physician Assistant

## 2022-08-22 ENCOUNTER — Encounter: Payer: Self-pay | Admitting: Physician Assistant

## 2022-08-22 VITALS — BP 132/83 | HR 57 | Resp 14 | Ht 64.0 in | Wt 163.0 lb

## 2022-08-22 DIAGNOSIS — M79642 Pain in left hand: Secondary | ICD-10-CM

## 2022-08-22 DIAGNOSIS — Z8269 Family history of other diseases of the musculoskeletal system and connective tissue: Secondary | ICD-10-CM

## 2022-08-22 DIAGNOSIS — N183 Chronic kidney disease, stage 3 unspecified: Secondary | ICD-10-CM

## 2022-08-22 DIAGNOSIS — M79641 Pain in right hand: Secondary | ICD-10-CM

## 2022-08-22 DIAGNOSIS — M19071 Primary osteoarthritis, right ankle and foot: Secondary | ICD-10-CM | POA: Diagnosis not present

## 2022-08-22 DIAGNOSIS — Z8719 Personal history of other diseases of the digestive system: Secondary | ICD-10-CM

## 2022-08-22 DIAGNOSIS — J4520 Mild intermittent asthma, uncomplicated: Secondary | ICD-10-CM

## 2022-08-22 DIAGNOSIS — Z8261 Family history of arthritis: Secondary | ICD-10-CM | POA: Diagnosis not present

## 2022-08-22 DIAGNOSIS — R768 Other specified abnormal immunological findings in serum: Secondary | ICD-10-CM | POA: Diagnosis not present

## 2022-08-22 DIAGNOSIS — G8929 Other chronic pain: Secondary | ICD-10-CM

## 2022-08-22 DIAGNOSIS — M25561 Pain in right knee: Secondary | ICD-10-CM

## 2022-08-22 DIAGNOSIS — I129 Hypertensive chronic kidney disease with stage 1 through stage 4 chronic kidney disease, or unspecified chronic kidney disease: Secondary | ICD-10-CM

## 2022-08-22 DIAGNOSIS — M19072 Primary osteoarthritis, left ankle and foot: Secondary | ICD-10-CM

## 2022-08-22 DIAGNOSIS — M25562 Pain in left knee: Secondary | ICD-10-CM

## 2022-08-22 LAB — CBC WITH DIFFERENTIAL/PLATELET
Eosinophils Absolute: 80 cells/uL (ref 15–500)
MPV: 10.6 fL (ref 7.5–12.5)
Neutro Abs: 2508 cells/uL (ref 1500–7800)
Platelets: 291 10*3/uL (ref 140–400)
RDW: 12.6 % (ref 11.0–15.0)
WBC: 4 10*3/uL (ref 3.8–10.8)

## 2022-08-23 LAB — COMPLETE METABOLIC PANEL WITH GFR
AG Ratio: 1.4 (calc) (ref 1.0–2.5)
AST: 16 U/L (ref 10–35)
Alkaline phosphatase (APISO): 55 U/L (ref 31–125)
BUN: 20 mg/dL (ref 7–25)
CO2: 25 mmol/L (ref 20–32)
Chloride: 104 mmol/L (ref 98–110)
Glucose, Bld: 56 mg/dL — ABNORMAL LOW (ref 65–139)
Total Bilirubin: 0.4 mg/dL (ref 0.2–1.2)
Total Protein: 7 g/dL (ref 6.1–8.1)
eGFR: 43 mL/min/{1.73_m2} — ABNORMAL LOW (ref >=60)

## 2022-08-23 LAB — CBC WITH DIFFERENTIAL/PLATELET
Absolute Monocytes: 424 cells/uL (ref 200–950)
Basophils Absolute: 32 cells/uL (ref 0–200)
Lymphs Abs: 956 cells/uL (ref 850–3900)
MCHC: 33.4 g/dL (ref 32.0–36.0)
MCV: 94.2 fL (ref 80.0–100.0)

## 2022-08-23 LAB — PROTEIN / CREATININE RATIO, URINE
Protein/Creatinine Ratio: 0.059 mg/mg creat (ref 0.024–0.184)
Total Protein, Urine: 9 mg/dL (ref 5–24)

## 2022-08-23 NOTE — Progress Notes (Signed)
CBC WNL  ESR WNL  Protein creatinine ratio WNL Creatinine is elevated-1.56 and GFR is low-43.  Rest of CMP WNL.  Please advise patient to avoid NSAIDs and forward results to her nephrologist.

## 2022-08-24 LAB — COMPLETE METABOLIC PANEL WITH GFR
ALT: 18 U/L (ref 6–29)
Albumin: 4.1 g/dL (ref 3.6–5.1)
BUN/Creatinine Ratio: 13 (calc) (ref 6–22)
Calcium: 9.5 mg/dL (ref 8.6–10.2)
Creat: 1.53 mg/dL — ABNORMAL HIGH (ref 0.50–0.99)
Globulin: 2.9 g/dL (calc) (ref 1.9–3.7)
Potassium: 3.9 mmol/L (ref 3.5–5.3)
Sodium: 139 mmol/L (ref 135–146)

## 2022-08-24 LAB — C3 AND C4
C3 Complement: 134 mg/dL (ref 83–193)
C4 Complement: 31 mg/dL (ref 15–57)

## 2022-08-24 LAB — ANTI-NUCLEAR AB-TITER (ANA TITER)
ANA TITER: 1:40 {titer} — ABNORMAL HIGH
ANA Titer 1: 1:40 {titer} — ABNORMAL HIGH

## 2022-08-24 LAB — CBC WITH DIFFERENTIAL/PLATELET
Basophils Relative: 0.8 %
Eosinophils Relative: 2 %
HCT: 37.4 % (ref 35.0–45.0)
Hemoglobin: 12.5 g/dL (ref 11.7–15.5)
MCH: 31.5 pg (ref 27.0–33.0)
Monocytes Relative: 10.6 %
Neutrophils Relative %: 62.7 %
RBC: 3.97 10*6/uL (ref 3.80–5.10)
Total Lymphocyte: 23.9 %

## 2022-08-24 LAB — SEDIMENTATION RATE: Sed Rate: 6 mm/h (ref 0–20)

## 2022-08-24 LAB — PROTEIN / CREATININE RATIO, URINE
Creatinine, Urine: 153 mg/dL (ref 20–275)
Protein/Creat Ratio: 59 mg/g creat (ref 24–184)

## 2022-08-24 LAB — RNP ANTIBODY: Ribonucleic Protein(ENA) Antibody, IgG: 2 AI — AB

## 2022-08-24 LAB — ANA: Anti Nuclear Antibody (ANA): POSITIVE — AB

## 2022-08-24 LAB — ANTI-DNA ANTIBODY, DOUBLE-STRANDED: ds DNA Ab: 1 IU/mL

## 2022-08-24 NOTE — Progress Notes (Signed)
ANA is positive.   We will continue to monitor lab work. No changes recommended at this time.

## 2022-08-24 NOTE — Progress Notes (Signed)
RNP remains positive.   Complements WNL dsDNA is negative

## 2022-09-19 ENCOUNTER — Encounter: Payer: Self-pay | Admitting: Internal Medicine

## 2022-10-13 IMAGING — MR MR PELVIS WO/W CM
8 of 12 series · 31 of 48 positions shown · IV contrast (multihance)
Comparison: CT abdomen/pelvis dated 11/08/2019. Pelvic ultrasound
dated 10/25/2019.

CLINICAL DATA: Right abdominal pain, status post uterine ablation,
chronic right hydronephrosis, evaluate for uterine fibroids

EXAM:
MRI PELVIS WITHOUT AND WITH CONTRAST
TECHNIQUE: Multiplanar multisequence MR imaging of the pelvis was performed
both before and after administration of intravenous contrast.
CONTRAST:  13mL MULTIHANCE GADOBENATE DIMEGLUMINE 529 MG/ML IV SOLN

[Series 2: cor haste · coronal · 6.0mm · 0.78mm/px · 3 of 25 slices shown]
[im 1/25]
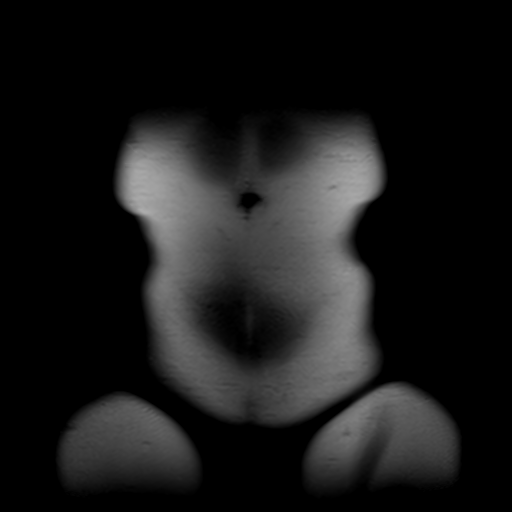
[im 13/25]
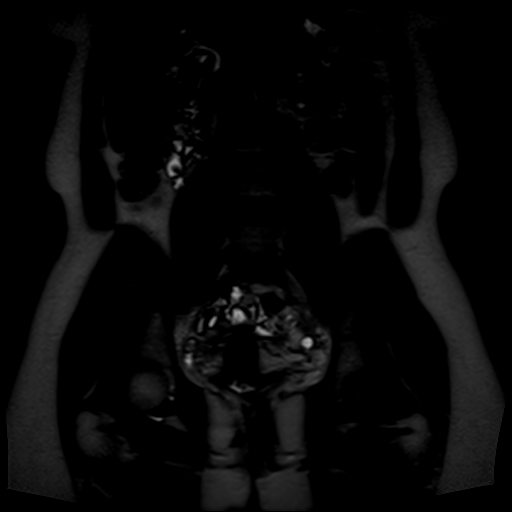
[im 25/25]
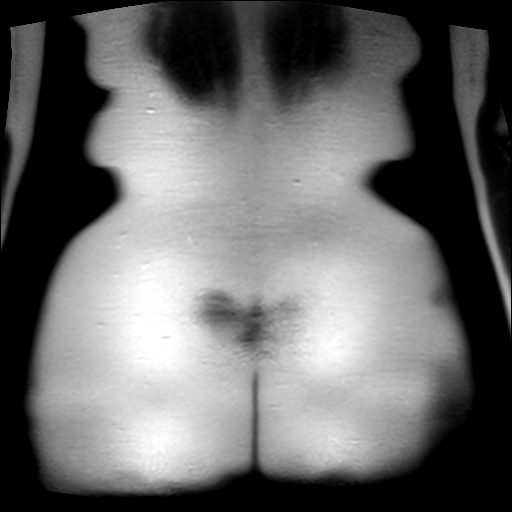

[Series 3: t2_tse_sag · sagittal · 5.0mm · 1.05mm/px · 3 of 27 slices shown]
[im 1/27]
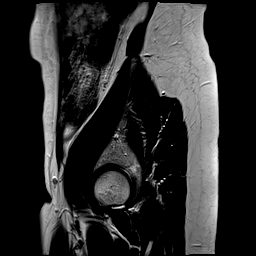
[im 14/27]
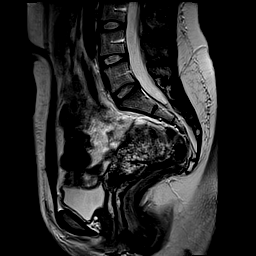
[im 27/27]
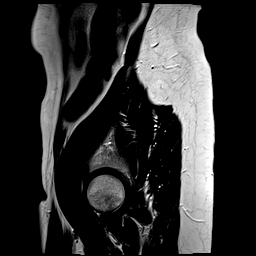

[Series 4: t2_tse axial · axial · 7.0mm · 0.98mm/px · z∈[-172,+55]mm · 4 of 26 slices shown]
[im 1/26]
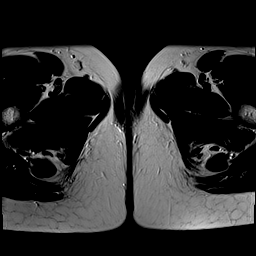
[im 9/26]
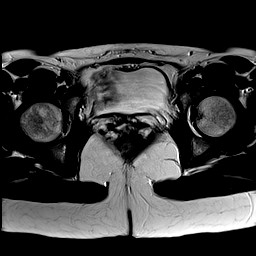
[im 17/26]
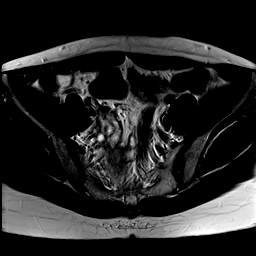
[im 26/26]
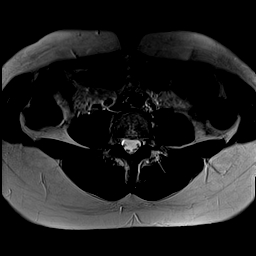

[Series 5: t2_tse axial fs · axial · 7.0mm · 0.98mm/px · z∈[-172,+55]mm · 4 of 26 slices shown]
[im 1/26]
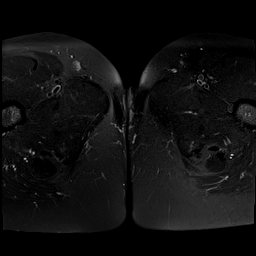
[im 9/26]
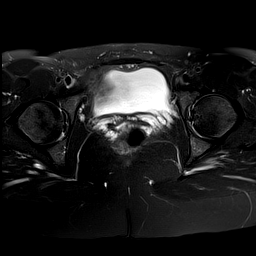
[im 17/26]
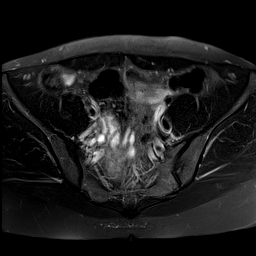
[im 26/26]
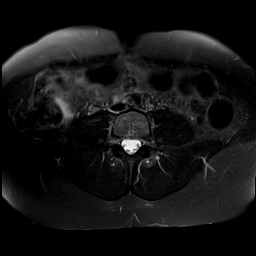

[Series 6: axial spgr · axial · 7.0mm · 0.98mm/px · z∈[-172,+55]mm · 4 of 26 slices shown]
[im 1/26]
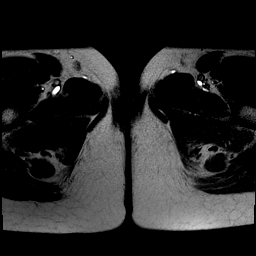
[im 9/26]
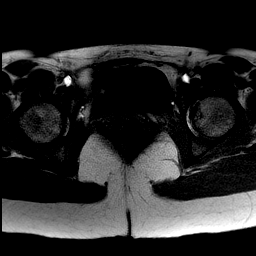
[im 17/26]
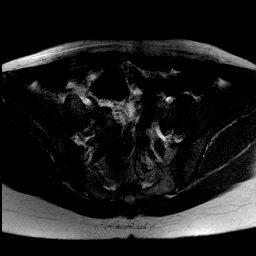
[im 26/26]
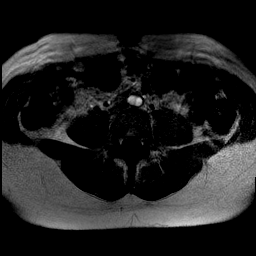

[Series 7: T1 · axial · 5.0mm · 0.59mm/px · z∈[-153,+12]mm · 8 of 62 slices shown]
[im 1/62]
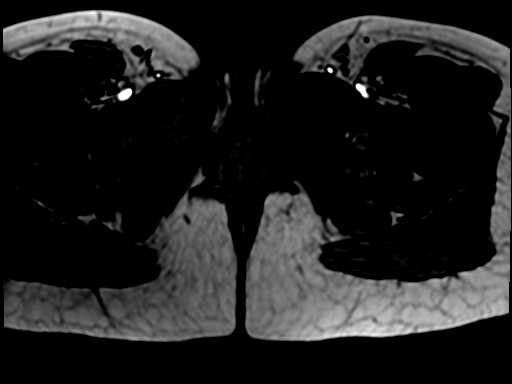
[im 8/62]
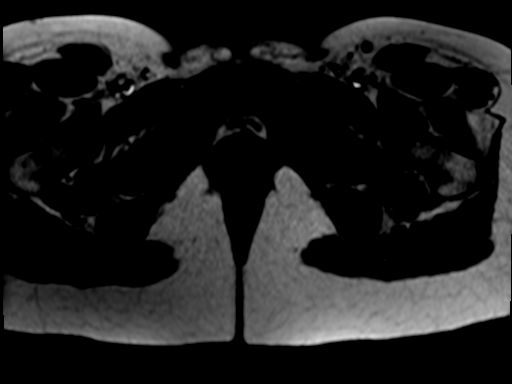
[im 16/62]
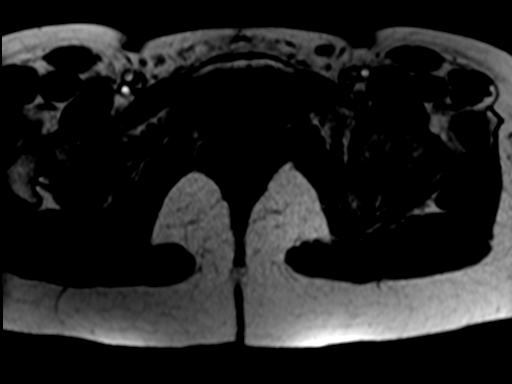
[im 23/62]
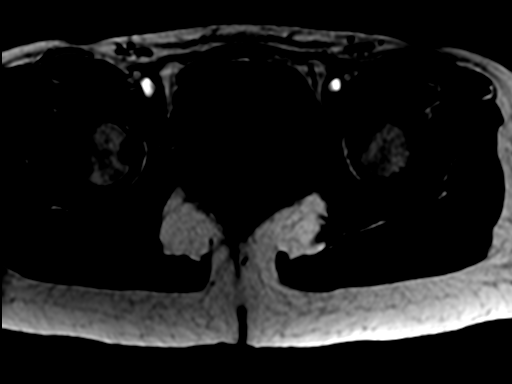
[im 39/62]
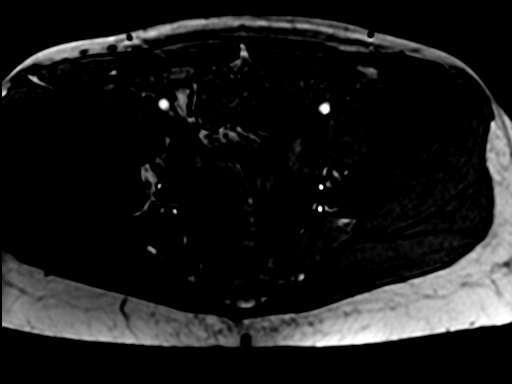
[im 46/62]
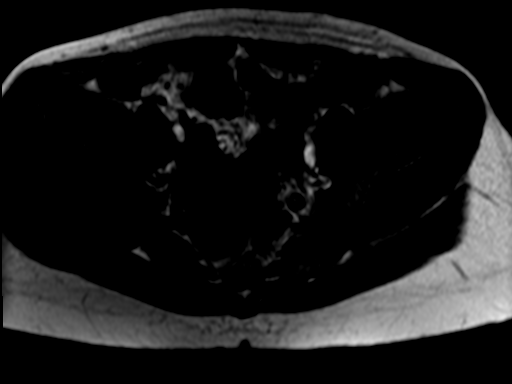
[im 54/62]
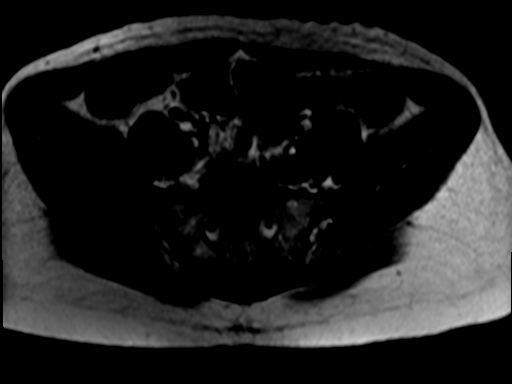
[im 62/62]
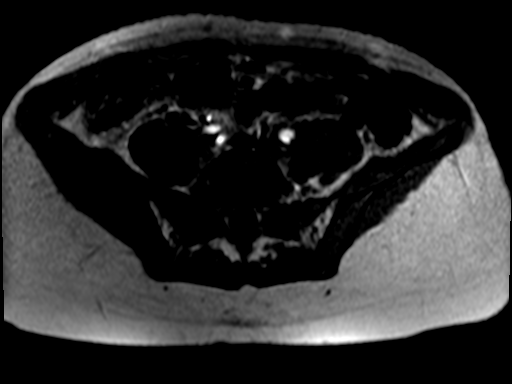

[Series 8: axial spgr pre · axial · non-contrast · 7.0mm · 0.49mm/px · z∈[-165,+62]mm · 4 of 26 slices shown]
[im 1/26]
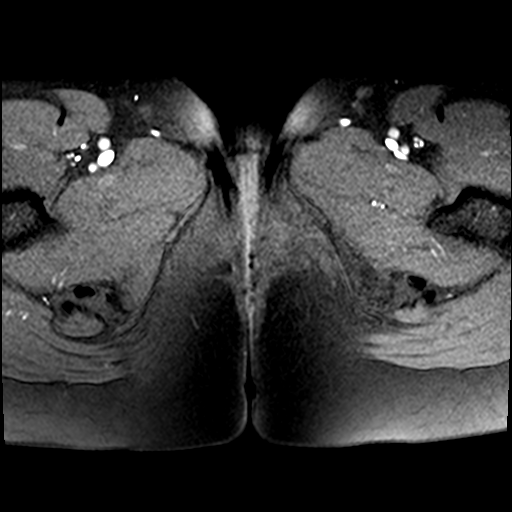
[im 9/26]
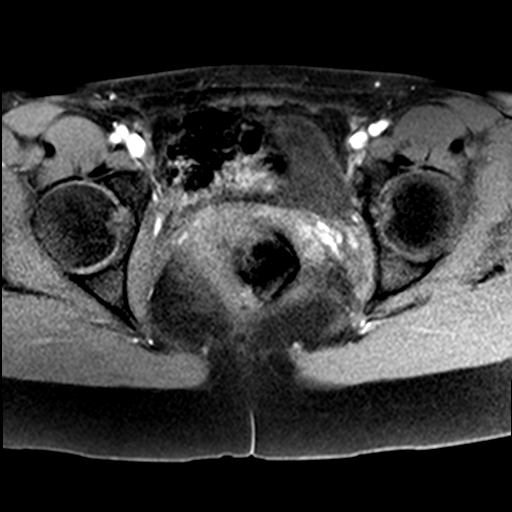
[im 17/26]
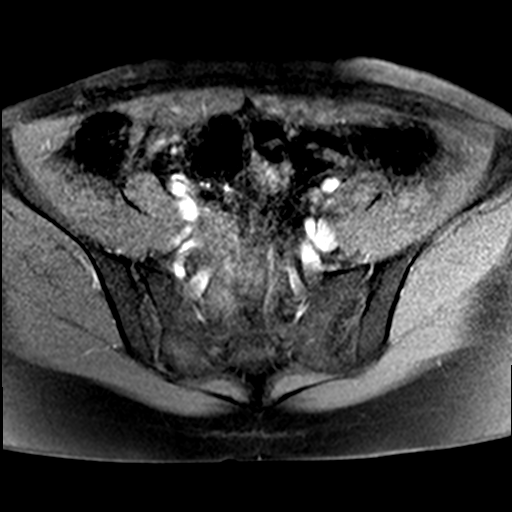
[im 26/26]
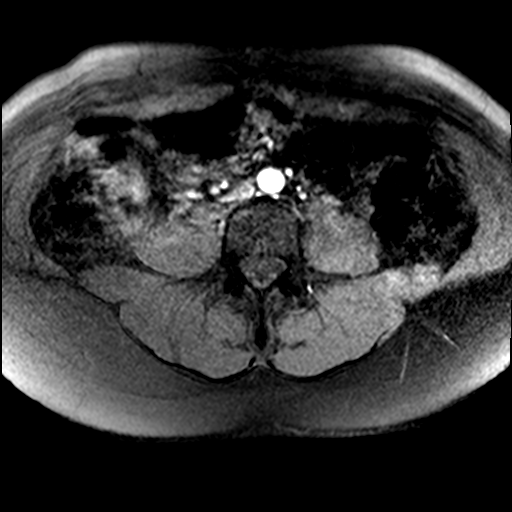

[Series 9: axial spgr post · axial · 7.0mm · 0.49mm/px · 1 of 26 slices shown]
[im 1/26]
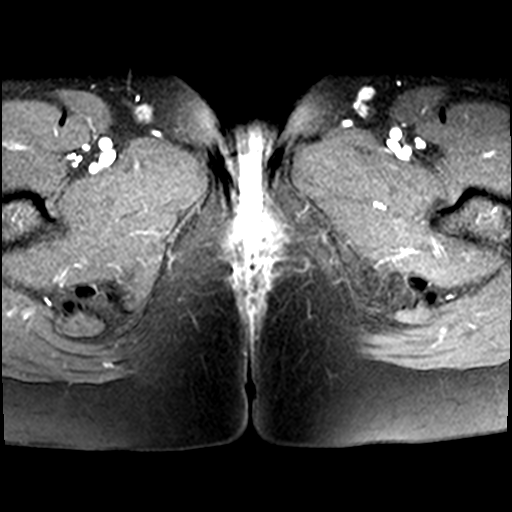

[31 of 48 positions shown; findings below may reference images not displayed]

FINDINGS: Urinary Tract: Moderate right hydronephrosis on coronal haste
imaging, improved from prior CT. Fullness of the left extrarenal
pelvis without hydronephrosis. Bladder is within normal limits.

Bowel:  Visualized bowel is unremarkable.

Vascular/Lymphatic: No evidence of aneurysm.

No suspicious pelvic lymphadenopathy.

Reproductive: Retroverted uterus. Endometrium is indistinct status
post endometrial ablation. Two small intramural fibroids measuring
up to 13 mm in the posterior uterine body (series 3/image 12).

Left ovary is notable for a 3.8 x 2.0 cm adnexal cyst (series
4/image 13), new/progressive, likely physiologic.

Right ovary is within normal limits (series 4/image 11).

Other:  Trace pelvic ascites (series 4/image 12), physiologic.

Musculoskeletal: No focal osseous lesions.
IMPRESSION: Retroverted uterus with two small intramural fibroids measuring up
to 13 mm, as above. Endometrial complex is indistinct status post
endometrial ablation.

3.8 cm left adnexal cyst, new/progressive from prior CT, likely
physiologic. No follow-up imaging recommended. Note: This
recommendation does not apply to premenarchal patients and to those
with increased risk (genetic, family history, elevated tumor markers
or other high-risk factors) of ovarian cancer. Reference: [id number]

Moderate right hydronephrosis, improved from prior CT.

## 2022-10-19 ENCOUNTER — Ambulatory Visit: Payer: BC Managed Care – PPO | Admitting: Internal Medicine

## 2022-10-19 ENCOUNTER — Encounter: Payer: Self-pay | Admitting: Internal Medicine

## 2022-10-19 VITALS — BP 122/86 | HR 73 | Temp 98.4°F | Ht 64.0 in | Wt 165.2 lb

## 2022-10-19 DIAGNOSIS — J4520 Mild intermittent asthma, uncomplicated: Secondary | ICD-10-CM

## 2022-10-19 DIAGNOSIS — I129 Hypertensive chronic kidney disease with stage 1 through stage 4 chronic kidney disease, or unspecified chronic kidney disease: Secondary | ICD-10-CM

## 2022-10-19 DIAGNOSIS — N1831 Chronic kidney disease, stage 3a: Secondary | ICD-10-CM

## 2022-10-19 MED ORDER — FLUTICASONE FUROATE-VILANTEROL 200-25 MCG/ACT IN AEPB: 1.0000 | INHALATION_SPRAY | Freq: Every day | RESPIRATORY_TRACT | 2 refills | Status: DC

## 2022-10-19 NOTE — Patient Instructions (Signed)
Hypertension, Adult Hypertension is another name for high blood pressure. High blood pressure forces your heart to work harder to pump blood. This can cause problems over time. There are two numbers in a blood pressure reading. There is a top number (systolic) over a bottom number (diastolic). It is best to have a blood pressure that is below 120/80. What are the causes? The cause of this condition is not known. Some other conditions can lead to high blood pressure. What increases the risk? Some lifestyle factors can make you more likely to develop high blood pressure: Smoking. Not getting enough exercise or physical activity. Being overweight. Having too much fat, sugar, calories, or salt (sodium) in your diet. Drinking too much alcohol. Other risk factors include: Having any of these conditions: Heart disease. Diabetes. High cholesterol. Kidney disease. Obstructive sleep apnea. Having a family history of high blood pressure and high cholesterol. Age. The risk increases with age. Stress. What are the signs or symptoms? High blood pressure may not cause symptoms. Very high blood pressure (hypertensive crisis) may cause: Headache. Fast or uneven heartbeats (palpitations). Shortness of breath. Nosebleed. Vomiting or feeling like you may vomit (nauseous). Changes in how you see. Very bad chest pain. Feeling dizzy. Seizures. How is this treated? This condition is treated by making healthy lifestyle changes, such as: Eating healthy foods. Exercising more. Drinking less alcohol. Your doctor may prescribe medicine if lifestyle changes do not help enough and if: Your top number is above 130. Your bottom number is above 80. Your personal target blood pressure may vary. Follow these instructions at home: Eating and drinking  If told, follow the DASH eating plan. To follow this plan: Fill one half of your plate at each meal with fruits and vegetables. Fill one fourth of your plate  at each meal with whole grains. Whole grains include whole-wheat pasta, brown rice, and whole-grain bread. Eat or drink low-fat dairy products, such as skim milk or low-fat yogurt. Fill one fourth of your plate at each meal with low-fat (lean) proteins. Low-fat proteins include fish, chicken without skin, eggs, beans, and tofu. Avoid fatty meat, cured and processed meat, or chicken with skin. Avoid pre-made or processed food. Limit the amount of salt in your diet to less than 1,500 mg each day. Do not drink alcohol if: Your doctor tells you not to drink. You are pregnant, may be pregnant, or are planning to become pregnant. If you drink alcohol: Limit how much you have to: 0-1 drink a day for women. 0-2 drinks a day for men. Know how much alcohol is in your drink. In the [place] one drink equals one 12 oz bottle of beer (355 mL), one 5 oz glass of wine (148 mL), or one 1 oz glass of hard liquor (44 mL). Lifestyle  Work with your doctor to stay at a healthy weight or to lose weight. Ask your doctor what the best weight is for you. Get at least 30 minutes of exercise that causes your heart to beat faster (aerobic exercise) most days of the week. This may include walking, swimming, or biking. Get at least 30 minutes of exercise that strengthens your muscles (resistance exercise) at least 3 days a week. This may include lifting weights or doing Pilates. Do not smoke or use any products that contain nicotine or tobacco. If you need help quitting, ask your doctor. Check your blood pressure at home as told by your doctor. Keep all follow-up visits. Medicines Take over-the-counter and prescription medicines   only as told by your doctor. Follow directions carefully. Do not skip doses of blood pressure medicine. The medicine does not work as well if you skip doses. Skipping doses also puts you at risk for problems. Ask your doctor about side effects or reactions to medicines that you should watch  for. Contact a doctor if: You think you are having a reaction to the medicine you are taking. You have headaches that keep coming back. You feel dizzy. You have swelling in your ankles. You have trouble with your vision. Get help right away if: You get a very bad headache. You start to feel mixed up (confused). You feel weak or numb. You feel faint. You have very bad pain in your: Chest. Belly (abdomen). You vomit more than once. You have trouble breathing. These symptoms may be an emergency. Get help right away. Call 911. Do not wait to see if the symptoms will go away. Do not drive yourself to the hospital. Summary Hypertension is another name for high blood pressure. High blood pressure forces your heart to work harder to pump blood. For most people, a normal blood pressure is less than 120/80. Making healthy choices can help lower blood pressure. If your blood pressure does not get lower with healthy choices, you may need to take medicine. This information is not intended to replace advice given to you by your health care provider. Make sure you discuss any questions you have with your health care provider. Document Revised: 02/11/2021 Document Reviewed: 02/11/2021 Elsevier Patient Education  2024 [employer]  

## 2022-10-19 NOTE — Progress Notes (Signed)
Pura Spice as a Neurosurgeon for Gwynneth Aliment, MD.,have documented all relevant documentation on the behalf of Gwynneth Aliment, MD,as directed by  Gwynneth Aliment, MD while in the presence of Gwynneth Aliment, MD.  Subjective:  Patient ID: Shelby Campbell , female    DOB: 1975-07-21 , 47 y.o.   MRN: [id number]  Chief Complaint  Patient presents with   Hypertension    HPI  She presents today for bp check. She reports compliance with meds. Denies headaches, chest pain and shortness of breath. She has no specific concerns or complaints at this time.    Hypertension This is a chronic problem. The current episode started more than 1 year ago. The problem has been gradually improving since onset. The problem is controlled. Pertinent negatives include no blurred vision. Past treatments include ACE inhibitors. The current treatment provides moderate improvement. Hypertensive end-organ damage includes kidney disease.     Past Medical History:  Diagnosis Date   Asthma    Chronic kidney disease    stage 3 a per lov dr Lawson Fiscal foster 10-01-2019 on chart   Hiatal hernia    History of mitral valve prolapse    no problems since birth of son 13 yrs ago (2008)   Hydronephrosis 2021   Per patient   Hypertension    Porphyria (HCC)    Porphyria (HCC)    Shortness of breath    related to exertion     Family History  Problem Relation Age of Onset   Hypertension Mother    Arthritis Mother    Hypertension Father    Prostate cancer Father    Arthritis Father    Gout Father    Healthy Son    Healthy Daughter      Current Outpatient Medications:    acetaminophen (TYLENOL) 500 MG tablet, Take 1,000 mg by mouth 2 (two) times daily as needed for headache (or flank pain)., Disp: , Rfl:    albuterol (VENTOLIN HFA) 108 (90 Base) MCG/ACT inhaler, Inhale 2 puffs into the lungs every 6 (six) hours as needed for wheezing or shortness of breath., Disp: 54 g, Rfl: 3   amLODipine (NORVASC) 2.5 MG  tablet, Take 2.5 mg by mouth daily., Disp: , Rfl:    ascorbic acid (VITAMIN C) 250 MG CHEW, 2 chewables Orally Once a day, Disp: , Rfl:    cholecalciferol (VITAMIN D3) 25 MCG (1000 UNIT) tablet, Take 1,000 Units by mouth daily. , Disp: , Rfl:    famotidine (PEPCID) 20 MG tablet, Take 1 tablet (20 mg total) by mouth daily., Disp: 90 tablet, Rfl: 1   metoprolol succinate (TOPROL-XL) 50 MG 24 hr tablet, Take 1 tablet (50 mg total) by mouth daily., Disp: 90 tablet, Rfl: 2   montelukast (SINGULAIR) 10 MG tablet, TAKE 1 TABLET(10 MG) BY MOUTH DAILY, Disp: 90 tablet, Rfl: 2   Multiple Vitamin (MULTIVITAMIN) tablet, Take 1 tablet by mouth daily., Disp: , Rfl:    Multiple Vitamins-Minerals (HAIR SKIN & NAILS) TABS, 2500 mcg 2 gummies Orally one a day, Disp: , Rfl:    benzonatate (TESSALON PERLES) 100 MG capsule, Take 1 capsule (100 mg total) by mouth 3 (three) times daily as needed for cough. (Patient not taking: Reported on 08/22/2022), Disp: 30 capsule, Rfl: 1   fluticasone furoate-vilanterol (BREO ELLIPTA) 200-25 MCG/ACT AEPB, Inhale 1 puff into the lungs daily., Disp: 180 each, Rfl: 2   Allergies  Allergen Reactions   Nsaids     And ibuprofen  cannot take due to chronic kidney disease   Other Other (See Comments)    Sulfonamides, sulfonylureas, barbiturates, antifungals, ketamine, etomidate, rifapentine, rifampicin, rifabutine, nitrofurantoin, metronidazole, ergot derivatives, indinavir, nevirapine, ritonavir, saquinavir, progestogens, progesterones, carbamazepine, phenytoin, valproate, oxycodone, pentazocine, cocaine, gold antirheumatics agents, sodium aurothiomalate, methyldopa, fenfluramine, ethosuzimide, flupentixol, flutamide, disulfiram, lidocaine    Sulfa Antibiotics Other (See Comments)    Heart beating fast      Review of Systems  Constitutional: Negative.   Eyes:  Negative for blurred vision.  Respiratory: Negative.    Cardiovascular: Negative.   Gastrointestinal: Negative.   Skin:  Negative.   Neurological: Negative.   Psychiatric/Behavioral: Negative.       Today's Vitals   10/19/22 1549  BP: 122/86  Pulse: 73  Temp: 98.4 F (36.9 C)  SpO2: 98%  Weight: 165 lb 3.2 oz (74.9 kg)  Height: 5\' 4"  (1.626 m)   Body mass index is 28.36 kg/m.  Wt Readings from Last 3 Encounters:  10/19/22 165 lb 3.2 oz (74.9 kg)  08/22/22 163 lb (73.9 kg)  04/19/22 160 lb 9.6 oz (72.8 kg)    The 10-year ASCVD risk score (Arnett DK, et al., 2019) is: 1.3%   Values used to calculate the score:     Age: 84 years     Sex: Female     Is Non-Hispanic African American: Yes     Diabetic: No     Tobacco smoker: No     Systolic Blood Pressure: 122 mmHg     Is BP treated: Yes     HDL Cholesterol: 62 mg/dL     Total Cholesterol: 171 mg/dL  Objective:  Physical Exam Vitals and nursing note reviewed.  Constitutional:      Appearance: Normal appearance.  HENT:     Head: Normocephalic and atraumatic.  Eyes:     Extraocular Movements: Extraocular movements intact.  Cardiovascular:     Rate and Rhythm: Normal rate and regular rhythm.     Heart sounds: Normal heart sounds.  Pulmonary:     Effort: Pulmonary effort is normal.     Breath sounds: Normal breath sounds.  Musculoskeletal:     Cervical back: Normal range of motion.  Skin:    General: Skin is warm.  Neurological:     General: No focal deficit present.     Mental Status: She is alert.  Psychiatric:        Mood and Affect: Mood normal.        Behavior: Behavior normal.       Assessment And Plan:  1. Benign hypertension with chronic kidney disease Comments: Chronic, fair control. Goal BP<120/80.  She will c/w amlodipine 2.5mg  daily and metoprolol XL 50mg  daily. Advised to follow low sodium diet.  2. Stage 3a chronic kidney disease (HCC) Comments: Chronic, reminded to avoid NSAIDs, stay well hydrated and keep BP well controlled to decrease risk of CKD progression. - PTH, intact and calcium - Phosphorus - Protein  electrophoresis, serum  3. Mild intermittent asthma without complication Comments: Chronic, stable on montelukast and Breo inhaler. She is encouraged to avoid known triggers.    Return if symptoms worsen or fail to improve.  Patient was given opportunity to ask questions. Patient verbalized understanding of the plan and was able to repeat key elements of the plan. All questions were answered to their satisfaction.   I, Gwynneth Aliment, MD, have reviewed all documentation for this visit. The documentation on 10/19/22 for the exam, diagnosis, procedures, and orders  are all accurate and complete.   IF YOU HAVE BEEN REFERRED TO A SPECIALIST, IT MAY TAKE 1-2 WEEKS TO SCHEDULE/PROCESS THE REFERRAL. IF YOU HAVE NOT HEARD FROM US/SPECIALIST IN TWO WEEKS, PLEASE GIVE [place] A CALL AT [phone].

## 2022-10-24 LAB — PTH, INTACT AND CALCIUM
Calcium: 9 mg/dL (ref 8.7–10.2)
PTH: 87 pg/mL — ABNORMAL HIGH (ref 15–65)

## 2022-10-24 LAB — PROTEIN ELECTROPHORESIS, SERUM
A/G Ratio: 1.2 (ref 0.7–1.7)
Albumin ELP: 3.8 g/dL (ref 2.9–4.4)
Alpha 1: 0.2 g/dL (ref 0.0–0.4)
Alpha 2: 0.6 g/dL (ref 0.4–1.0)
Beta: 1 g/dL (ref 0.7–1.3)
Gamma Globulin: 1.3 g/dL (ref 0.4–1.8)
Globulin, Total: 3.2 g/dL (ref 2.2–3.9)
Total Protein: 7 g/dL (ref 6.0–8.5)

## 2022-10-24 LAB — PHOSPHORUS: Phosphorus: 2.8 mg/dL — ABNORMAL LOW (ref 3.0–4.3)

## 2022-12-30 ENCOUNTER — Other Ambulatory Visit: Payer: Self-pay | Admitting: Internal Medicine

## 2023-01-31 ENCOUNTER — Other Ambulatory Visit: Payer: Self-pay | Admitting: Internal Medicine

## 2023-01-31 DIAGNOSIS — I129 Hypertensive chronic kidney disease with stage 1 through stage 4 chronic kidney disease, or unspecified chronic kidney disease: Secondary | ICD-10-CM

## 2023-02-03 ENCOUNTER — Other Ambulatory Visit: Payer: Self-pay

## 2023-02-03 MED ORDER — MONTELUKAST SODIUM 10 MG PO TABS: ORAL_TABLET | ORAL | 2 refills | Status: DC

## 2023-02-08 LAB — HM MAMMOGRAPHY

## 2023-02-09 NOTE — Progress Notes (Signed)
Office Visit Note  Patient: Shelby Campbell             Date of Birth: 16-Aug-1975           MRN: [id number]             PCP: Dorothyann Peng, MD Referring: Dorothyann Peng, MD Visit Date: 02/22/2023 Occupation: @GUAROCC @  Subjective:  Left knee pain  History of Present Illness: Shelby Campbell is a 47 y.o. female with osteoarthritis and positive ANA.  She states she slammed her left index finger in March and the door.  She states that gradually the pain went away but she still has difficulty bending her finger and she has noticed decreased strength in her finger.  She has been also experiencing pain and discomfort in her left knee.  She had evaluation by the orthopedic surgeon and was told that she has osteoarthritis.  She states recently she has been having increased pain and discomfort in her left knee.  She has difficulty with prolonged standing and also getting up from the sitting position.  She denies any history of oral ulcers, nasal ulcers, malar rash, sicca symptoms, Raynaud's phenomenon, lymphadenopathy or inflammatory arthritis.    Activities of Daily Living:  Patient reports morning stiffness for 20  minutes.   Patient Denies nocturnal pain.  Difficulty dressing/grooming: Denies Difficulty climbing stairs: Denies Difficulty getting out of chair: Denies Difficulty using hands for taps, buttons, cutlery, and/or writing: Denies  Review of Systems  Constitutional:  Negative for fatigue.  HENT:  Negative for mouth sores and mouth dryness.   Eyes:  Negative for dryness.  Respiratory:  Positive for shortness of breath.        Related to asthma.  Cardiovascular:  Negative for chest pain and palpitations.  Gastrointestinal:  Negative for blood in stool, constipation and diarrhea.  Endocrine: Negative for increased urination.  Genitourinary:  Negative for difficulty urinating.  Musculoskeletal:  Positive for joint pain, joint pain and morning stiffness. Negative for myalgias and  myalgias.  Skin:  Negative for color change, rash and sensitivity to sunlight.  Allergic/Immunologic: Negative for susceptible to infections.  Neurological:  Negative for dizziness and headaches.  Hematological:  Negative for swollen glands.  Psychiatric/Behavioral:  Negative for depressed mood and sleep disturbance. The patient is not nervous/anxious.     PMFS History:  Patient Active Problem List   Diagnosis Date Noted   BMI 27.0-27.9,adult 10/18/2021   Body mass index (BMI) of 25.0 to 25.9 in adult 10/12/2020   Subconjunctival hemorrhage of right eye 10/12/2020   Chronic renal disease, stage III (HCC) 04/11/2018   Asthma 03/01/2018   Benign hypertension with chronic kidney disease 02/26/2018   Chronic gastritis, mild.  H Pylori negative 07/31/2013   Chronic cholecystitis with calculus 07/31/2013    Past Medical History:  Diagnosis Date   Asthma    Chronic kidney disease    stage 3 a per lov dr Lawson Fiscal foster 10-01-2019 on chart   Hiatal hernia    History of mitral valve prolapse    no problems since birth of son 13 yrs ago (2008)   Hydronephrosis 2021   Per patient   Hypertension    Porphyria (HCC)    Porphyria (HCC)    Shortness of breath    related to exertion    Family History  Problem Relation Age of Onset   Hypertension Mother    Arthritis Mother    Hypertension Father    Prostate cancer Father  Arthritis Father    Gout Father    Healthy Son    Healthy Daughter    Past Surgical History:  Procedure Laterality Date   AXILLARY LYMPH NODE BIOPSY  2010   right    CHOLECYSTECTOMY     CYSTOSCOPY WITH RETROGRADE PYELOGRAM, URETEROSCOPY AND STENT PLACEMENT Right 10/15/2019   Procedure: CYSTOSCOPY WITH RETROGRADE PYELOGRAM, URETEROSCOPY AND STENT PLACEMENT, attempted laser of stricture;  Surgeon: Noel Christmas, MD;  Location: [facility];  Service: Urology;  Laterality: Right;  1 HR   DILITATION & CURRETTAGE/HYSTROSCOPY WITH NOVASURE ABLATION N/A  05/04/2016   Procedure: DILATATION & CURETTAGE/HYSTEROSCOPY WITH NOVASURE ABLATION;  Surgeon: Maxie Better, MD;  Location: [facility];  Service: Gynecology;  Laterality: N/A;   LAPAROSCOPIC CHOLECYSTECTOMY SINGLE PORT N/A 09/10/2013   Procedure: LAPAROSCOPIC CHOLECYSTECTOMY SINGLE SITE WITH CHOLANGIOGRAM;  Surgeon: Ardeth Sportsman, MD;  Location: [facility];  Service: General;  Laterality: N/A;   TUBAL LIGATION     Social History   Social History Narrative   Not on file   Immunization History  Administered Date(s) Administered   Influenza,inj,Quad PF,6+ Mos 03/01/2018, 04/08/2020, 03/27/2021   Influenza-Unspecified 03/28/2022   Janssen (J&J) SARS-COV-2 Vaccination 08/08/2019   Moderna Sars-Covid-2 Vaccination 04/01/2020   Pfizer Covid-19 Vaccine Bivalent Booster 50yrs & up 03/27/2021   Td 04/12/2003   Tdap 12/20/2010, 04/13/2021     Objective: Vital Signs: BP 131/88 (BP Location: Left Arm, Patient Position: Sitting, Cuff Size: Normal)   Pulse 67   Resp 14   Ht 5\' 4"  (1.626 m)   BMI 28.36 kg/m    Physical Exam Vitals and nursing note reviewed.  Constitutional:      Appearance: She is well-developed.  HENT:     Head: Normocephalic and atraumatic.  Eyes:     Conjunctiva/sclera: Conjunctivae normal.  Cardiovascular:     Rate and Rhythm: Normal rate and regular rhythm.     Heart sounds: Normal heart sounds.  Pulmonary:     Effort: Pulmonary effort is normal.     Breath sounds: Normal breath sounds.  Abdominal:     General: Bowel sounds are normal.     Palpations: Abdomen is soft.  Musculoskeletal:     Cervical back: Normal range of motion.  Lymphadenopathy:     Cervical: No cervical adenopathy.  Skin:    General: Skin is warm and dry.     Capillary Refill: Capillary refill takes less than 2 seconds.  Neurological:     Mental Status: She is alert and oriented to person, place, and time.  Psychiatric:        Behavior: Behavior normal.      Musculoskeletal Exam:  Cervical, thoracic and lumbar spine were in good range of motion.  Shoulder joints, elbow joints, wrist joints, MCPs PIPs and DIPs were in good range of motion with no synovitis.  Left index finger DIP joint had some limitation with flexing.  Hip joints, knee joints, ankles, MTPs and PIPs been good range of motion.  No warmth swelling or effusion was noted.  There was no tenderness over ankles or MTPs.  CDAI Exam: CDAI Score: -- Patient Global: --; Provider Global: -- Swollen: --; Tender: -- Joint Exam 02/22/2023   No joint exam has been documented for this visit   There is currently no information documented on the homunculus. Go to the Rheumatology activity and complete the homunculus joint exam.  Investigation: No additional findings.  Imaging: No results found.  Recent Labs: Lab Results  Component  Value Date   WBC 4.0 08/22/2022   HGB 12.5 08/22/2022   PLT 291 08/22/2022   NA 139 08/22/2022   K 3.9 08/22/2022   CL 104 08/22/2022   CO2 25 08/22/2022   GLUCOSE 56 (L) 08/22/2022   BUN 20 08/22/2022   CREATININE 1.53 (H) 08/22/2022   BILITOT 0.4 08/22/2022   ALKPHOS 58 04/19/2022   AST 16 08/22/2022   ALT 18 08/22/2022   PROT 7.0 10/19/2022   ALBUMIN 4.6 04/19/2022   CALCIUM 9.0 10/19/2022   GFRAA 46 05/13/2021    Speciality Comments: No specialty comments available.  Procedures:  No procedures performed Allergies: Nsaids, Other, and Sulfa antibiotics   Assessment / Plan:     Visit Diagnoses: Positive ANA (antinuclear antibody) - ANA 1: 80 speckled, positive RNP,  family history of systemic lupus and rheumatoid arthritis: -Patient denies any history of oral ulcers, nasal ulcers, malar rash, photosensitivity, Raynaud's, lymphadenopathy or inflammatory arthritis.  Plan: Protein / creatinine ratio, urine, CBC with Differential/Platelet, COMPLETE METABOLIC PANEL WITH GFR, Anti-DNA antibody, double-stranded, ANA, C3 and C4, Sedimentation rate, RNP Antibody.  Will contact  her once the lab results are available.  Injury of left index finger, sequela-patient had injury to her left index finger in March 2024.  She has some difficulty flexing the finger living after several months.  She is not having any swelling or pain.  X-rays of the left index finger 3 views were obtained.  No fracture or callus formation was noted.  Mild PIP and DIP narrowing was noted.  X-ray findings were reviewed with the patient.  I will refer her to occupational therapy.  Chronic pain of both knees -she complains of some discomfort in her bilateral knee joints.  She states her left knee joint is very bothersome.  She has difficulty getting up from the chair and also with prolonged standing.  She states she was evaluated by an orthopedic surgeon in the past and had MRI of the knee joint.  She was told that she had osteoarthritis and no internal derangement.  Plan: XR KNEE 3 VIEW LEFT.  X-rays of the left knee joint were unremarkable.  X-ray findings were reviewed with the patient.  Patient was referred to physical therapy.  Primary osteoarthritis of both feet-proper fitting shoes were advised.  Family history of rheumatoid arthritis - Mother  Family history of systemic lupus erythematosus - Cousin  Stage 3 chronic kidney disease, unspecified whether stage 3a or 3b CKD (HCC)  Mild intermittent asthma without complication  Benign hypertension with chronic kidney disease-blood pressure was 131/88 today.  History of gastritis  Orders: Orders Placed This Encounter  Procedures   XR KNEE 3 VIEW LEFT   XR Finger Index Left   Protein / creatinine ratio, urine   CBC with Differential/Platelet   COMPLETE METABOLIC PANEL WITH GFR   Anti-DNA antibody, double-stranded   ANA   C3 and C4   Sedimentation rate   RNP Antibody   No orders of the defined types were placed in this encounter.    Follow-Up Instructions: Return in about 6 months (around 08/23/2023) for Osteoarthritis.   Pollyann Savoy, MD  Note - This record has been created using Animal nutritionist.  Chart creation errors have been sought, but may not always  have been located. Such creation errors do not reflect on  the standard of medical care.

## 2023-02-22 ENCOUNTER — Ambulatory Visit: Payer: BC Managed Care – PPO

## 2023-02-22 ENCOUNTER — Ambulatory Visit: Payer: BC Managed Care – PPO | Attending: Rheumatology | Admitting: Rheumatology

## 2023-02-22 ENCOUNTER — Encounter: Payer: Self-pay | Admitting: Rheumatology

## 2023-02-22 VITALS — BP 131/88 | HR 67 | Resp 14 | Ht 64.0 in | Wt 163.0 lb

## 2023-02-22 DIAGNOSIS — I129 Hypertensive chronic kidney disease with stage 1 through stage 4 chronic kidney disease, or unspecified chronic kidney disease: Secondary | ICD-10-CM

## 2023-02-22 DIAGNOSIS — S6992XS Unspecified injury of left wrist, hand and finger(s), sequela: Secondary | ICD-10-CM | POA: Diagnosis not present

## 2023-02-22 DIAGNOSIS — M19071 Primary osteoarthritis, right ankle and foot: Secondary | ICD-10-CM | POA: Diagnosis not present

## 2023-02-22 DIAGNOSIS — M25562 Pain in left knee: Secondary | ICD-10-CM

## 2023-02-22 DIAGNOSIS — M79641 Pain in right hand: Secondary | ICD-10-CM

## 2023-02-22 DIAGNOSIS — R768 Other specified abnormal immunological findings in serum: Secondary | ICD-10-CM

## 2023-02-22 DIAGNOSIS — G8929 Other chronic pain: Secondary | ICD-10-CM

## 2023-02-22 DIAGNOSIS — M25561 Pain in right knee: Secondary | ICD-10-CM

## 2023-02-22 DIAGNOSIS — Z8261 Family history of arthritis: Secondary | ICD-10-CM

## 2023-02-22 DIAGNOSIS — Z8269 Family history of other diseases of the musculoskeletal system and connective tissue: Secondary | ICD-10-CM

## 2023-02-22 DIAGNOSIS — M19072 Primary osteoarthritis, left ankle and foot: Secondary | ICD-10-CM

## 2023-02-22 DIAGNOSIS — Z8719 Personal history of other diseases of the digestive system: Secondary | ICD-10-CM

## 2023-02-22 DIAGNOSIS — J4520 Mild intermittent asthma, uncomplicated: Secondary | ICD-10-CM

## 2023-02-22 DIAGNOSIS — N183 Chronic kidney disease, stage 3 unspecified: Secondary | ICD-10-CM

## 2023-02-22 NOTE — Patient Instructions (Signed)
Exercises for Chronic Knee Pain Chronic knee pain is pain that lasts longer than 3 months. For most people with chronic knee pain, exercise and weight loss is an important part of treatment. Your health care provider may want you to focus on: Making the muscles that support your knee stronger. This can take pressure off your knee and reduce pain. Preventing knee stiffness. How far you can move your knee, keeping it there or making it farther. Losing weight (if this applies) to take pressure off your knee, lower your risk for injury, and make it easier for you to exercise. Your provider will help you make an exercise program that fits your needs and physical abilities. Below are simple, low-impact exercises you can do at home. Ask your provider or physical therapist how often you should do your exercise program and how many times to repeat each exercise. General safety tips  Get your provider's approval before doing any exercises. Start slowly and stop any time you feel pain. Do not exercise if your knee pain is flaring up. Warm up first. Stretching a cold muscle can cause an injury. Do 5-10 minutes of easy movement or light stretching before beginning your exercises. Do 5-10 minutes of low-impact activity (like walking or cycling) before starting strengthening exercises. Contact your provider any time you have pain during or after exercising. Exercise can cause discomfort but should not be painful. It is normal to be a little stiff or sore after exercising. Stretching and range-of-motion exercises Front thigh stretch  Stand up straight and support your body by holding on to a chair or resting one hand on a wall. With your legs straight and close together, bend one knee to lift your heel up toward your butt. Using one hand for support, grab your ankle with your free hand. Pull your foot up closer toward your butt to feel the stretch in front of your thigh. Hold the stretch for 30  seconds. Repeat __________ times. Complete this exercise __________ times a day. Back thigh stretch  Sit on the floor with your back straight and your legs out straight in front of you. Place the palms of your hands on the floor and slide them toward your feet as you bend at the hip. Try to touch your nose to your knees and feel the stretch in the back of your thighs. Hold for 30 seconds. Repeat __________ times. Complete this exercise __________ times a day. Calf stretch  Stand facing a wall. Place the palms of your hands flat against the wall, arms extended, and lean slightly against the wall. Get into a lunge position with one leg bent at the knee and the other leg stretched out straight behind you. Keep both feet facing the wall and increase the bend in your knee while keeping the heel of the other leg flat on the ground. You should feel the stretch in your calf. Hold for 30 seconds. Repeat __________ times. Complete this exercise __________ times a day. Strengthening exercises Straight leg lift  Lie on your back with one knee bent and the other leg out straight. Slowly lift the straight leg without bending the knee. Lift until your foot is about 12 inches (30 cm) off the floor. Hold for 3-5 seconds and slowly lower your leg. Repeat __________ times. Complete this exercise __________ times a day. Single leg dip  Stand between two chairs and put both hands on the backs of the chairs for support. Extend one leg out straight with your body  weight resting on the heel of the standing leg. Slowly bend your standing knee to dip your body to the level that is comfortable for you. Hold for 3-5 seconds. Repeat __________ times. Complete this exercise __________ times a day. Hamstring curls  Stand straight, knees close together, facing the back of a chair. Hold on to the back of a chair with both hands. Keep one leg straight. Bend the other knee while bringing the heel up toward the butt  until the knee is bent at a 90-degree angle (right angle). Hold for 3-5 seconds. Repeat __________ times. Complete this exercise __________ times a day. Wall squat  Stand straight with your back, hips, and head against a wall. Step forward one foot at a time with your back still against the wall. Your feet should be 2 feet (61 cm) from the wall at shoulder width. Keeping your back, hips, and head against the wall, slide down the wall to as close to a sitting position as you can get. Hold for 5-10 seconds, then slowly slide back up. Repeat __________ times. Complete this exercise __________ times a day. Step-ups  Stand in front of a sturdy platform or stool that is about 6 inches (15 cm) high. Slowly step up with your left / right foot, keeping your knee in line with your hip and foot. Do not let your knee bend so far that you cannot see your toes. Hold on to a chair for balance, but do not use it for support. Slowly unlock your knee and lower yourself to the starting position. Repeat __________ times. Complete this exercise __________ times a day. Contact a health care provider if: Your exercises cause pain. Your pain is worse after you exercise. Your pain prevents you from doing your exercises. This information is not intended to replace advice given to you by your health care provider. Make sure you discuss any questions you have with your health care provider. Document Revised: 05/10/2022 Document Reviewed: 05/10/2022 Elsevier Patient Education  2024 [employer] Hand Exercises Hand exercises can be helpful for almost anyone. They can strengthen your hands and improve flexibility and movement. The exercises can also increase blood flow to the hands. These results can make your work and daily tasks easier for you. Hand exercises can be especially helpful for people who have joint pain from arthritis or nerve damage from using their hands over and over. These exercises can also help  people who injure a hand. Exercises Most of these hand exercises are gentle stretching and motion exercises. It is usually safe to do them often throughout the day. Warming up your hands before exercise may help reduce stiffness. You can do this with gentle massage or by placing your hands in warm water for 10-15 minutes. It is normal to feel some stretching, pulling, tightness, or mild discomfort when you begin new exercises. In time, this will improve. Remember to always be careful and stop right away if you feel sudden, very bad pain or your pain gets worse. You want to get better and be safe. Ask your health care provider which exercises are safe for you. Do exercises exactly as told by your provider and adjust them as told. Do not begin these exercises until told by your provider. Knuckle bend or "claw" fist  Stand or sit with your arm, hand, and all five fingers pointed straight up. Make sure to keep your wrist straight. Gently bend your fingers down toward your palm until the tips of your fingers  are touching your palm. Keep your big knuckle straight and only bend the small knuckles in your fingers. Hold this position for 10 seconds. Straighten your fingers back to your starting position. Repeat this exercise 5-10 times with each hand. Full finger fist  Stand or sit with your arm, hand, and all five fingers pointed straight up. Make sure to keep your wrist straight. Gently bend your fingers into your palm until the tips of your fingers are touching the middle of your palm. Hold this position for 10 seconds. Extend your fingers back to your starting position, stretching every joint fully. Repeat this exercise 5-10 times with each hand. Straight fist  Stand or sit with your arm, hand, and all five fingers pointed straight up. Make sure to keep your wrist straight. Gently bend your fingers at the big knuckle, where your fingers meet your hand, and at the middle knuckle. Keep the knuckle at  the tips of your fingers straight and try to touch the bottom of your palm. Hold this position for 10 seconds. Extend your fingers back to your starting position, stretching every joint fully. Repeat this exercise 5-10 times with each hand. Tabletop  Stand or sit with your arm, hand, and all five fingers pointed straight up. Make sure to keep your wrist straight. Gently bend your fingers at the big knuckle, where your fingers meet your hand, as far down as you can. Keep the small knuckles in your fingers straight. Think of forming a tabletop with your fingers. Hold this position for 10 seconds. Extend your fingers back to your starting position, stretching every joint fully. Repeat this exercise 5-10 times with each hand. Finger spread  Place your hand flat on a table with your palm facing down. Make sure your wrist stays straight. Spread your fingers and thumb apart from each other as far as you can until you feel a gentle stretch. Hold this position for 10 seconds. Bring your fingers and thumb tight together again. Hold this position for 10 seconds. Repeat this exercise 5-10 times with each hand. Making circles  Stand or sit with your arm, hand, and all five fingers pointed straight up. Make sure to keep your wrist straight. Make a circle by touching the tip of your thumb to the tip of your index finger. Hold for 10 seconds. Then open your hand wide. Repeat this motion with your thumb and each of your fingers. Repeat this exercise 5-10 times with each hand. Thumb motion  Sit with your forearm resting on a table and your wrist straight. Your thumb should be facing up toward the ceiling. Keep your fingers relaxed as you move your thumb. Lift your thumb up as high as you can toward the ceiling. Hold for 10 seconds. Bend your thumb across your palm as far as you can, reaching the tip of your thumb for the small finger (pinkie) side of your palm. Hold for 10 seconds. Repeat this exercise  5-10 times with each hand. Grip strengthening  Hold a stress ball or other soft ball in the middle of your hand. Slowly increase the pressure, squeezing the ball as much as you can without causing pain. Think of bringing the tips of your fingers into the middle of your palm. All of your finger joints should bend when doing this exercise. Hold your squeeze for 10 seconds, then relax. Repeat this exercise 5-10 times with each hand. Contact a health care provider if: Your hand pain or discomfort gets much worse when you  do an exercise. Your hand pain or discomfort does not improve within 2 hours after you exercise. If you have either of these problems, stop doing these exercises right away. Do not do them again unless your provider says that you can. Get help right away if: You develop sudden, severe hand pain or swelling. If this happens, stop doing these exercises right away. Do not do them again unless your provider says that you can. This information is not intended to replace advice given to you by your health care provider. Make sure you discuss any questions you have with your health care provider. Document Revised: 05/10/2022 Document Reviewed: 05/10/2022 Elsevier Patient Education  2024 [employer].

## 2023-02-22 NOTE — Addendum Note (Signed)
Addended by: Audrie Lia on: 02/22/2023 09:04 AM   Modules accepted: Orders

## 2023-02-23 NOTE — Progress Notes (Signed)
Creatinine is elevated at 1.81 GFR is low at 35.  Please notify patient and forward results to her nephrologist.  Urine protein creatinine ratio normal, CBC normal, complements normal, sed rate normal.  ANA, double-stranded DNA and RNP pending.

## 2023-02-24 ENCOUNTER — Ambulatory Visit: Payer: BC Managed Care – PPO | Admitting: Occupational Therapy

## 2023-02-24 ENCOUNTER — Other Ambulatory Visit: Payer: Self-pay

## 2023-02-24 ENCOUNTER — Ambulatory Visit: Payer: BC Managed Care – PPO | Attending: Rheumatology | Admitting: Physical Therapy

## 2023-02-24 ENCOUNTER — Encounter: Payer: Self-pay | Admitting: Occupational Therapy

## 2023-02-24 DIAGNOSIS — R208 Other disturbances of skin sensation: Secondary | ICD-10-CM

## 2023-02-24 DIAGNOSIS — G8929 Other chronic pain: Secondary | ICD-10-CM | POA: Insufficient documentation

## 2023-02-24 DIAGNOSIS — M6281 Muscle weakness (generalized): Secondary | ICD-10-CM

## 2023-02-24 DIAGNOSIS — R29898 Other symptoms and signs involving the musculoskeletal system: Secondary | ICD-10-CM

## 2023-02-24 DIAGNOSIS — M25561 Pain in right knee: Secondary | ICD-10-CM | POA: Insufficient documentation

## 2023-02-24 DIAGNOSIS — M25562 Pain in left knee: Secondary | ICD-10-CM | POA: Diagnosis present

## 2023-02-24 LAB — ANTI-NUCLEAR AB-TITER (ANA TITER): ANA Titer 1: 1:80 {titer} — ABNORMAL HIGH

## 2023-02-24 LAB — COMPLETE METABOLIC PANEL WITH GFR
AG Ratio: 1.4 (calc) (ref 1.0–2.5)
ALT: 15 U/L (ref 6–29)
AST: 17 U/L (ref 10–35)
Albumin: 4.3 g/dL (ref 3.6–5.1)
Alkaline phosphatase (APISO): 67 U/L (ref 31–125)
BUN/Creatinine Ratio: 16 (calc) (ref 6–22)
BUN: 29 mg/dL — ABNORMAL HIGH (ref 7–25)
CO2: 27 mmol/L (ref 20–32)
Calcium: 9.8 mg/dL (ref 8.6–10.2)
Chloride: 105 mmol/L (ref 98–110)
Creat: 1.81 mg/dL — ABNORMAL HIGH (ref 0.50–0.99)
Globulin: 3 g/dL (ref 1.9–3.7)
Glucose, Bld: 62 mg/dL — ABNORMAL LOW (ref 65–99)
Potassium: 4 mmol/L (ref 3.5–5.3)
Sodium: 141 mmol/L (ref 135–146)
Total Bilirubin: 0.4 mg/dL (ref 0.2–1.2)
Total Protein: 7.3 g/dL (ref 6.1–8.1)
eGFR: 35 mL/min/{1.73_m2} — ABNORMAL LOW (ref >=60)

## 2023-02-24 LAB — PROTEIN / CREATININE RATIO, URINE
Creatinine, Urine: 181 mg/dL (ref 20–275)
Protein/Creat Ratio: 66 mg/g{creat} (ref 24–184)
Protein/Creatinine Ratio: 0.066 mg/mg{creat} (ref 0.024–0.184)
Total Protein, Urine: 12 mg/dL (ref 5–24)

## 2023-02-24 LAB — ANA: Anti Nuclear Antibody (ANA): POSITIVE — AB

## 2023-02-24 LAB — CBC WITH DIFFERENTIAL/PLATELET
Absolute Lymphocytes: 878 {cells}/uL (ref 850–3900)
Absolute Monocytes: 350 {cells}/uL (ref 200–950)
Basophils Absolute: 19 {cells}/uL (ref 0–200)
Basophils Relative: 0.4 %
Eosinophils Absolute: 91 {cells}/uL (ref 15–500)
Eosinophils Relative: 1.9 %
HCT: 38.3 % (ref 35.0–45.0)
Hemoglobin: 12.6 g/dL (ref 11.7–15.5)
MCH: 30.9 pg (ref 27.0–33.0)
MCHC: 32.9 g/dL (ref 32.0–36.0)
MCV: 93.9 fL (ref 80.0–100.0)
MPV: 10.5 fL (ref 7.5–12.5)
Monocytes Relative: 7.3 %
Neutro Abs: 3461 {cells}/uL (ref 1500–7800)
Neutrophils Relative %: 72.1 %
Platelets: 263 10*3/uL (ref 140–400)
RBC: 4.08 10*6/uL (ref 3.80–5.10)
RDW: 12.1 % (ref 11.0–15.0)
Total Lymphocyte: 18.3 %
WBC: 4.8 10*3/uL (ref 3.8–10.8)

## 2023-02-24 LAB — RNP ANTIBODY: Ribonucleic Protein(ENA) Antibody, IgG: 1.9 AI — AB

## 2023-02-24 LAB — SEDIMENTATION RATE: Sed Rate: 17 mm/h (ref 0–20)

## 2023-02-24 LAB — C3 AND C4
C3 Complement: 138 mg/dL (ref 83–193)
C4 Complement: 35 mg/dL (ref 15–57)

## 2023-02-24 LAB — ANTI-DNA ANTIBODY, DOUBLE-STRANDED: ds DNA Ab: <1 [IU]/mL

## 2023-02-24 NOTE — Therapy (Signed)
OUTPATIENT OCCUPATIONAL THERAPY ORTHO EVALUATION  Patient Name: Shelby Campbell MRN: [id number] DOB:[birth year], 47 y.o., female Today's Date: 02/24/2023  PCP: Dorothyann Peng, MD  REFERRING PROVIDER: Pollyann Savoy, MD  END OF SESSION:  OT End of Session - 02/24/23 0957     Visit Number 1    Number of Visits 6    Date for OT Re-Evaluation 04/21/23    Authorization Type [employer] - covered at 100% [employer]    OT Start Time 0850    OT Stop Time 0930    OT Time Calculation (min) 40 min    Activity Tolerance Patient tolerated treatment well    Behavior During Therapy [facility] for tasks assessed/performed             Past Medical History:  Diagnosis Date   Asthma    Chronic kidney disease    stage 3 a per lov dr Lawson Fiscal foster 10-01-2019 on chart   Hiatal hernia    History of mitral valve prolapse    no problems since birth of son 13 yrs ago (2008)   Hydronephrosis 2021   Per patient   Hypertension    Porphyria (HCC)    Porphyria (HCC)    Shortness of breath    related to exertion   Past Surgical History:  Procedure Laterality Date   AXILLARY LYMPH NODE BIOPSY  2010   right    CHOLECYSTECTOMY     CYSTOSCOPY WITH RETROGRADE PYELOGRAM, URETEROSCOPY AND STENT PLACEMENT Right 10/15/2019   Procedure: CYSTOSCOPY WITH RETROGRADE PYELOGRAM, URETEROSCOPY AND STENT PLACEMENT, attempted laser of stricture;  Surgeon: Noel Christmas, MD;  Location: [facility];  Service: Urology;  Laterality: Right;  1 HR   DILITATION & CURRETTAGE/HYSTROSCOPY WITH NOVASURE ABLATION N/A 05/04/2016   Procedure: DILATATION & CURETTAGE/HYSTEROSCOPY WITH NOVASURE ABLATION;  Surgeon: Maxie Better, MD;  Location: [facility];  Service: Gynecology;  Laterality: N/A;   LAPAROSCOPIC CHOLECYSTECTOMY SINGLE PORT N/A 09/10/2013   Procedure: LAPAROSCOPIC CHOLECYSTECTOMY SINGLE SITE WITH CHOLANGIOGRAM;  Surgeon: Ardeth Sportsman, MD;  Location: [facility];  Service: General;  Laterality: N/A;   TUBAL LIGATION      Patient Active Problem List   Diagnosis Date Noted   BMI 27.0-27.9,adult 10/18/2021   Body mass index (BMI) of 25.0 to 25.9 in adult 10/12/2020   Subconjunctival hemorrhage of right eye 10/12/2020   Chronic renal disease, stage III (HCC) 04/11/2018   Asthma 03/01/2018   Benign hypertension with chronic kidney disease 02/26/2018   Chronic gastritis, mild.  H Pylori negative 07/31/2013   Chronic cholecystitis with calculus 07/31/2013    ONSET DATE: 02/22/23 (referral date), March 2024 - date of initial injury to left index finger  REFERRING DIAG: S69.92XS (ICD-10-CM) - Injury of left index finger, sequela   THERAPY DIAG:  Muscle weakness (generalized)  Other disturbances of skin sensation  Other symptoms and signs involving the musculoskeletal system  Rationale for Evaluation and Treatment: Rehabilitation  SUBJECTIVE:   SUBJECTIVE STATEMENT: Pt reports slamming finger in car door in March 2024 and pointed out damaged fingernail. Pt pointed out location of injury - pt slammed in car door just distal to DIP and just proximal to nailbed. Pt reports difficulty using aerosol deodorant d/t need to push down a button with left index finger when applying deodorant to R axilla. Pt reports it feels like "weakness" and "pain every once in a while."  Pt accompanied by: self  PERTINENT HISTORY: osteoarthritis, chronic renal disease stage III, asthma, TN, chronic gastritis, hiatal hernia  Per 02/22/23 MD Progress Notes: "Shelby Campbell is a 47 y.o. female with osteoarthritis and positive ANA.  She states she slammed her left index finger in March and the door.  She states that gradually the pain went away but she still has difficulty bending her finger and she has noticed decreased strength in her finger.  She has been also experiencing pain and discomfort in her left knee.  She had evaluation by the orthopedic surgeon and was told that she has osteoarthritis."  PRECAUTIONS: None  RED  FLAGS: Osteoarthritis  WEIGHT BEARING RESTRICTIONS: No  PAIN:  Are you having pain? No - At worst, pt reports 3-4/10 pain "mostly annoying, a slight twinge."  FALLS: Has patient fallen in last 6 months? 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  HAND DOMINANCE: Right  ADLs: Overall ADLs:  Transfers/ambulation related to ADLs: Eating: ind Grooming: ind,  difficulty with isolated L digit motion when pushing down button of aerosol deodorant UB Dressing: no difficulty, including buttons/zippers LB Dressing: no difficulty with pants, shoes, tying shoelaces Toileting: no difficulty Bathing: no difficulty Tub Shower transfers: ind Equipment: none  FUNCTIONAL OUTCOME MEASURES: Quick Dash: 15.9 points    UPPER EXTREMITY ROM:     WFL for BUE  HAND FUNCTION: Grip strength: Right: 45.1 lbs; Left: 30.6 lbs and Tip pinch: Right 9 lbs, Left: 9 lbs  COORDINATION: 9 Hole Peg test: Right: 19 sec; Left: 20 sec  SENSATION: Pt reports mild pins and needles sensation with "twinge" in distal tip of L index finger , which "comes and goes."   EDEMA: None noted during eval, pt reported some swelling near end of day sometimes.  COGNITION: Overall cognitive status: Within functional limits for tasks assessed  OBSERVATIONS: Pt ambulated ind and appeared well-kept. Pt agreeable. Pt's L index finger skin appeared  intact, no redness or swelling noted. OT noted pt's L index fingernail slightly distorted shape and white line at center of nail. Pt reported half of L index fingernail fell off after initial injury when slamming finger in car door. Pt reports fingernail has regrown since then though the nail seems weaker.   TODAY'S TREATMENT:                                                                                                                              DATE:   Therapeutic activities:  Initiated tendon glides  HEP. Pt demo'd understanding. Handout provided, see pt instructions.  OT educated pt on joint protection strategies, pain management with activity tolerance/rest breaks, fingernail regrowth after injury. Pt acknowledged understanding. Hand out provided for joint protection, see pt instructions. OT recommended pt using silicone jar grippers when opening containers to increase ind with daily tasks and to protect joints. Pt verbalized understanding.   PATIENT EDUCATION: Education details: see today's treatment above Person educated: Patient Education method: Explanation, Demonstration, and Handouts Education comprehension: verbalized understanding and returned demonstration  HOME EXERCISE PROGRAM: 02/24/23 - tendon glides - handout  GOALS: Goals reviewed with patient? Yes  SHORT TERM GOALS: Target date: 03/24/23  Patient will ind demonstrate understanding of updated LUE HEP. Baseline: new to outpt OT Goal status: INITIAL  2.  Patient will demonstrate at least 5 lbs increase of LUE grip strength as needed to open jars and other containers.  Baseline: Left: 30.6 lbs Goal status: INITIAL  3.  Pt will independently recall at least 3 total joint protection, ergonomic, and body mechanic principles.   Baseline: new to outpt OT Goal status: INITIAL    LONG TERM GOALS: Target date: 04/21/23  Pt will report ind opening jars with no difficulty using A/E and joint protection strategies  PRN. Baseline: QuickDASH - pt reported "mild difficulty" with opening jars. Goal status: INITIAL  2.  Pt will report no more than 1/10 pain when using aerosol deodorant during daily grooming tasks. Baseline: Pt currently reports up to 4/10 pain at worst during the day: "mostly annoying, a slight twinge." Goal status: INITIAL    ASSESSMENT:  CLINICAL IMPRESSION: Patient is a 47 y.o. female who was seen today for occupational therapy evaluation for injury of left index finger, sequela. Hx includes osteoarthritis, chronic renal disease stage III, asthma, TN, chronic gastritis, hiatal hernia. Patient currently presents below baseline level of functioning demonstrating functional deficits and impairments as noted below. Pt would benefit from skilled OT services in the outpatient setting to work on impairments as noted below to help pt return to PLOF as able.      PERFORMANCE DEFICITS: in functional skills including ADLs, IADLs, strength, pain, Fine motor control, Gross motor control, body mechanics, endurance, and UE functional use, cognitive skills including  none , and psychosocial skills including environmental adaptation.   IMPAIRMENTS: are limiting patient from ADLs, IADLs, and work.   COMORBIDITIES: may have co-morbidities  that affects occupational performance. Patient will benefit from skilled OT to address above impairments and improve overall function.  MODIFICATION OR ASSISTANCE TO COMPLETE EVALUATION: No modification of tasks or assist necessary to complete an evaluation.  OT OCCUPATIONAL PROFILE AND HISTORY: Problem focused assessment: Including review of records relating to presenting problem.  CLINICAL DECISION MAKING: LOW - limited treatment options, no task modification necessary  REHAB POTENTIAL: Excellent  EVALUATION COMPLEXITY: Low      PLAN:  OT FREQUENCY: 1x/week  OT DURATION: 6 weeks (dates extended to allow for scheduling)  PLANNED INTERVENTIONS: 97168 OT  Re-evaluation, 97535 self care/ADL training, 16109 therapeutic exercise, 97530 therapeutic activity, 97112 neuromuscular re-education, 97140 manual therapy, 97035 ultrasound, 97018 paraffin, 60454 fluidotherapy, 97010 moist heat, 97010 cryotherapy, 97034 contrast bath, 97032 electrical stimulation (manual), 97014 electrical stimulation unattended, 97760 Orthotics management and training, 09811 Splinting (initial encounter), [id number] Subsequent splinting/medication, manual lymph drainage, scar mobilization, passive range of motion, functional mobility training, compression bandaging, energy conservation, patient/family education, and DME and/or AE instructions  RECOMMENDED OTHER SERVICES: N/A  CONSULTED AND AGREED WITH PLAN OF CARE: Patient  PLAN FOR NEXT SESSION:   Review joint protection strategies - work station Psychologist, prison and probation services, Catering manager.  HEP: Initiate theraputty exercises (red or green) to improve LUE strengthening - include isolated L DIP index finger motion  HEP: Initiate LUE wrist/hand/digit stretches  Educate heat/cold modalities to decrease pain    Wynetta Emery, OT 02/24/2023, 10:02 AM

## 2023-02-24 NOTE — Therapy (Signed)
OUTPATIENT PHYSICAL THERAPY LOWER EXTREMITY EVALUATION   Patient Name: Shelby Campbell MRN: [id number] DOB:Oct 08, 1975, 47 y.o., female Today's Date: 02/24/2023  END OF SESSION:  PT End of Session - 02/24/23 0933     Visit Number 1    Number of Visits 7    Date for PT Re-Evaluation 04/21/23    Authorization Type BCBS    PT Start Time [phone]    PT Stop Time 1007    PT Time Calculation (min) 36 min    Activity Tolerance Patient tolerated treatment well    Behavior During Therapy [facility] for tasks assessed/performed             Past Medical History:  Diagnosis Date   Asthma    Chronic kidney disease    stage 3 a per lov dr Lawson Fiscal foster 10-01-2019 on chart   Hiatal hernia    History of mitral valve prolapse    no problems since birth of son 13 yrs ago (2008)   Hydronephrosis 2021   Per patient   Hypertension    Porphyria (HCC)    Porphyria (HCC)    Shortness of breath    related to exertion   Past Surgical History:  Procedure Laterality Date   AXILLARY LYMPH NODE BIOPSY  2010   right    CHOLECYSTECTOMY     CYSTOSCOPY WITH RETROGRADE PYELOGRAM, URETEROSCOPY AND STENT PLACEMENT Right 10/15/2019   Procedure: CYSTOSCOPY WITH RETROGRADE PYELOGRAM, URETEROSCOPY AND STENT PLACEMENT, attempted laser of stricture;  Surgeon: Noel Christmas, MD;  Location: [facility] [place];  Service: Urology;  Laterality: Right;  1 HR   DILITATION & CURRETTAGE/HYSTROSCOPY WITH NOVASURE ABLATION N/A 05/04/2016   Procedure: DILATATION & CURETTAGE/HYSTEROSCOPY WITH NOVASURE ABLATION;  Surgeon: Maxie Better, MD;  Location: [facility];  Service: Gynecology;  Laterality: N/A;   LAPAROSCOPIC CHOLECYSTECTOMY SINGLE PORT N/A 09/10/2013   Procedure: LAPAROSCOPIC CHOLECYSTECTOMY SINGLE SITE WITH CHOLANGIOGRAM;  Surgeon: Ardeth Sportsman, MD;  Location: [facility];  Service: General;  Laterality: N/A;   TUBAL LIGATION     Patient Active Problem List   Diagnosis Date Noted   BMI 27.0-27.9,adult 10/18/2021    Body mass index (BMI) of 25.0 to 25.9 in adult 10/12/2020   Subconjunctival hemorrhage of right eye 10/12/2020   Chronic renal disease, stage III (HCC) 04/11/2018   Asthma 03/01/2018   Benign hypertension with chronic kidney disease 02/26/2018   Chronic gastritis, mild.  H Pylori negative 07/31/2013   Chronic cholecystitis with calculus 07/31/2013    PCP: Dorothyann Peng, MD  REFERRING PROVIDER: Pollyann Savoy, MD  REFERRING DIAG: M25.561,M25.562,G89.29 (ICD-10-CM) - Chronic pain of both knees  THERAPY DIAG:  Chronic pain of both knees  Muscle weakness (generalized)  Rationale for Evaluation and Treatment: Rehabilitation  ONSET DATE: 02/22/2023 (referral)   SUBJECTIVE:   SUBJECTIVE STATEMENT: Pt reports she has been experiencing knee pain for >2 years. States her left knee is worse than her right. States her pain is constant, even at night. Tries to change positions throughout the day, but all of them hurt (sitting, standing, knee extended/bent). Fell earlier in the year while sparring and sprained her wrist. When fighting, right leg is her kicking leg and her pressure is on her left leg. Has insoles for her shoes to wear on long days at work that help some. Denies swelling or popping of her knee, does get "stuck" in extension at times. Pt very active and likes to engage in [place].   PERTINENT HISTORY: osteoarthritis and positive  ANA PAIN:  Are you having pain? No  PRECAUTIONS: Fall  RED FLAGS: None   WEIGHT BEARING RESTRICTIONS: No  FALLS:  Has patient fallen in last 6 months? Yes. Number of falls 1 (fell while sparring)  LIVING ENVIRONMENT: Lives with: lives with their family and lives with their spouse Lives in: House/apartment Stairs: Yes: Internal: 16 steps; on right going up and External: 2 in garage, 1 in front steps; none Has following equipment at home: Single point cane, Walker - 2 wheeled, and Grab bars  OCCUPATION: Engineer   PLOF:  Independent  PATIENT GOALS: "to relieve the constant pain"    OBJECTIVE:  Note: Objective measures were completed at Evaluation unless otherwise noted.  DIAGNOSTIC FINDINGS: X-ray of L knee from 10/24   No medial or lateral compartment narrowing was noted.  No patellofemoral narrowing was noted.   Impression: Unremarkable x-rays of the knee.   PATIENT SURVEYS:  LEFS 58/80 (mild to moderate functional limitation)  COGNITION: Overall cognitive status: Within functional limits for tasks assessed     SENSATION: WFL  POSTURE: No Significant postural limitations  PALPATION: No TTP along patella, patellar tendon or joint line   LOWER EXTREMITY MMT: Tested in seated position  MMT Right eval Left eval  Hip flexion 5 5  Hip extension    Hip abduction 5 5  Hip adduction 5 5  Hip internal rotation    Hip external rotation    Knee flexion 5 5  Knee extension 5 4-  Ankle dorsiflexion 5 5  Ankle plantarflexion    Ankle inversion    Ankle eversion     (Blank rows = not tested)  LOWER EXTREMITY SPECIAL TESTS:  Knee special tests: Patella tap test (ballotable patella): negative  FUNCTIONAL TESTS:  SLS: >10s bilaterally, decreased stability on LLE w/compensated trendelenburg   GAIT: Distance walked: Various clinic distances  Assistive device utilized: None Level of assistance: Complete Independence    TODAY'S TREATMENT:     Next Session                                                                                                                            PATIENT EDUCATION:  Education details: POC, eval findings  Person educated: Patient Education method: Explanation Education comprehension: verbalized understanding  HOME EXERCISE PROGRAM: To be established   ASSESSMENT:  CLINICAL IMPRESSION: Patient is a 47 year old female referred to Neuro OPPT for bilateral knee pain. Pt's PMH is significant for: osteoarthritis and positive ANA .The following deficits  were present during the exam: decreased functional quad/hip strength on LLE and decreased activity tolerance. Based on history of falls and recreational activities, pt is an incr risk for falls. Pt would benefit from skilled PT to address these impairments and functional limitations to maximize functional mobility independence.    OBJECTIVE IMPAIRMENTS: decreased activity tolerance, decreased endurance, decreased mobility, difficulty walking, decreased strength, and pain.   ACTIVITY LIMITATIONS: bending, sitting, standing, squatting, stairs, and  locomotion level  PARTICIPATION LIMITATIONS: shopping, community activity, occupation, and yard work  PERSONAL FACTORS: Fitness are also affecting patient's functional outcome.   REHAB POTENTIAL: Good  CLINICAL DECISION MAKING: Stable/uncomplicated  EVALUATION COMPLEXITY: Low   GOALS: Goals reviewed with patient? Yes  SHORT TERM GOALS: Target date: 03/24/2023  Pt will be independent with initial HEP for improved strength and pain management   Baseline: Goal status: INITIAL  LONG TERM GOALS: Target date: 04/07/2023   Pt will return to [place] for improved strength and return to PLOF  Baseline: Pt has not returned since May  Goal status: INITIAL  2.  Pt will improve score on LEFS to >/= 68/80 for reduced pain levels and improved functional mobility Baseline: 58/80 Goal status: INITIAL   PLAN:  PT FREQUENCY: 1x/week  PT DURATION: 6 weeks (POC written for 8 weeks due to delay in scheduling)   PLANNED INTERVENTIONS: 16109- PT Re-evaluation, 97110-Therapeutic exercises, 97530- Therapeutic activity, 97112- Neuromuscular re-education, 97535- Self Care, 60454- Manual therapy, [id number]- Gait training, [id number]- Aquatic Therapy, [phone]- Electrical stimulation (manual), Balance training, Stair training, Taping, and Dry Needling  PLAN FOR NEXT SESSION: HEP for L quad and hip abduction strength. Single leg stability, jumping, boxing tasks     Amrit Erck E Mairin Lindsley, PT 02/24/2023, 10:09 AM

## 2023-02-27 NOTE — Progress Notes (Signed)
RNP remains positive, stable titer.  ANA is low titer positive and stable.  dsDNA is negative.  No change in treatment advised.

## 2023-02-28 ENCOUNTER — Ambulatory Visit: Payer: BC Managed Care – PPO | Admitting: Occupational Therapy

## 2023-02-28 DIAGNOSIS — M25561 Pain in right knee: Secondary | ICD-10-CM | POA: Diagnosis not present

## 2023-02-28 DIAGNOSIS — R29898 Other symptoms and signs involving the musculoskeletal system: Secondary | ICD-10-CM

## 2023-02-28 DIAGNOSIS — M6281 Muscle weakness (generalized): Secondary | ICD-10-CM

## 2023-02-28 DIAGNOSIS — R208 Other disturbances of skin sensation: Secondary | ICD-10-CM

## 2023-02-28 NOTE — Therapy (Signed)
OUTPATIENT OCCUPATIONAL THERAPY Treatment  Patient Name: Shelby Campbell MRN: [id number] DOB:[birth year], 47 y.o., female Today's Date: 02/28/2023  PCP: Dorothyann Peng, MD  REFERRING PROVIDER: Pollyann Savoy, MD  END OF SESSION:  OT End of Session - 02/28/23 1050     Visit Number 2    Number of Visits 6    Date for OT Re-Evaluation 04/21/23    Authorization Type BCBS/T[employer] - covered at [employer]% [employer]    OT Start Time 0852    OT Stop Time 0930    OT Time Calculation (min) 38 min    Equipment Utilized During Treatment theraputty    Activity Tolerance Patient tolerated treatment well    Behavior During Therapy [facility] for tasks assessed/performed              Past Medical History:  Diagnosis Date   Asthma    Chronic kidney disease    stage 3 a per lov dr Lawson Fiscal foster 10-01-2019 on chart   Hiatal hernia    History of mitral valve prolapse    no problems since birth of son 13 yrs ago (2008)   Hydronephrosis 2021   Per patient   Hypertension    Porphyria (HCC)    Porphyria (HCC)    Shortness of breath    related to exertion   Past Surgical History:  Procedure Laterality Date   AXILLARY LYMPH NODE BIOPSY  2010   right    CHOLECYSTECTOMY     CYSTOSCOPY WITH RETROGRADE PYELOGRAM, URETEROSCOPY AND STENT PLACEMENT Right 10/15/2019   Procedure: CYSTOSCOPY WITH RETROGRADE PYELOGRAM, URETEROSCOPY AND STENT PLACEMENT, attempted laser of stricture;  Surgeon: Noel Christmas, MD;  Location: [facility];  Service: Urology;  Laterality: Right;  1 HR   DILITATION & CURRETTAGE/HYSTROSCOPY WITH NOVASURE ABLATION N/A 05/04/2016   Procedure: DILATATION & CURETTAGE/HYSTEROSCOPY WITH NOVASURE ABLATION;  Surgeon: Maxie Better, MD;  Location: [facility];  Service: Gynecology;  Laterality: N/A;   LAPAROSCOPIC CHOLECYSTECTOMY SINGLE PORT N/A 09/10/2013   Procedure: LAPAROSCOPIC CHOLECYSTECTOMY SINGLE SITE WITH CHOLANGIOGRAM;  Surgeon: Ardeth Sportsman, MD;  Location: [facility];  Service:  General;  Laterality: N/A;   TUBAL LIGATION     Patient Active Problem List   Diagnosis Date Noted   BMI 27.0-27.9,adult 10/18/2021   Body mass index (BMI) of 25.0 to 25.9 in adult 10/12/2020   Subconjunctival hemorrhage of right eye 10/12/2020   Chronic renal disease, stage III (HCC) 04/11/2018   Asthma 03/01/2018   Benign hypertension with chronic kidney disease 02/26/2018   Chronic gastritis, mild.  H Pylori negative 07/31/2013   Chronic cholecystitis with calculus 07/31/2013    ONSET DATE: 02/22/23 (referral date), March 2024 - date of initial injury to left index finger  REFERRING DIAG: S69.92XS (ICD-10-CM) - Injury of left index finger, sequela   THERAPY DIAG:  Muscle weakness (generalized)  Other disturbances of skin sensation  Other symptoms and signs involving the musculoskeletal system  Rationale for Evaluation and Treatment: Rehabilitation  SUBJECTIVE:   SUBJECTIVE STATEMENT: No changes to medication, no recent falls. Pt reports completing HEP tendon glides exercises.  Pt accompanied by: self  PERTINENT HISTORY: osteoarthritis, chronic renal disease stage III, asthma, [place], chronic gastritis, hiatal hernia  Per 02/22/23 MD Progress Notes: "Shelby Campbell is a 47 y.o. female with osteoarthritis and positive ANA.  She states she slammed her left index finger in March and the door.  She states that gradually the pain went away but she still has difficulty bending her finger  and she has noticed decreased strength in her finger.  She has been also experiencing pain and discomfort in her left knee.  She had evaluation by the orthopedic surgeon and was told that she has osteoarthritis."  PRECAUTIONS: None  RED FLAGS: Osteoarthritis  WEIGHT BEARING RESTRICTIONS: No  PAIN:  Are you having pain? No and Yes: NPRS scale: 2/10 Pain location: L index finger, distal tip Pain description: "twinge" Aggravating factors: nothing, comes and goes Relieving factors:  none  FALLS: Has patient fallen in last 6 months? 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  HAND DOMINANCE: Right  ADLs: Overall ADLs:  Transfers/ambulation related to ADLs: Eating: ind Grooming: ind,  difficulty with isolated L digit motion when pushing down button of aerosol deodorant UB Dressing: no difficulty, including buttons/zippers LB Dressing: no difficulty with pants, shoes, tying shoelaces Toileting: no difficulty Bathing: no difficulty Tub Shower transfers: ind Equipment: none  FUNCTIONAL OUTCOME MEASURES: Quick Dash: 15.9 points    UPPER EXTREMITY ROM:     WFL for BUE  HAND FUNCTION: Grip strength: Right: 45.1 lbs; Left: 30.6 lbs and Tip pinch: Right 9 lbs, Left: 9 lbs  COORDINATION: 9 Hole Peg test: Right: 19 sec; Left: 20 sec  SENSATION: Pt reports mild pins and needles sensation with "twinge" in distal tip of L index finger , which "comes and goes."   EDEMA: None noted during eval, pt reported some swelling near end of day sometimes.  COGNITION: Overall cognitive status: Within functional limits for tasks assessed  OBSERVATIONS: Pt ambulated ind and appeared well-kept. Pt agreeable. Pt's L index finger skin appeared intact, no redness or swelling noted. OT noted pt's L index fingernail slightly distorted shape and white line at center of nail. Pt reported half of L index  fingernail fell off after initial injury when slamming finger in car door. Pt reports fingernail has regrown since then though the nail seems weaker.   TODAY'S TREATMENT:                                                                                                                              DATE:   Therapeutic Exercises: Moist heat with tendon glides - to decrease pain, improve ROM - timed 4 minutes. OT educated pt about tendon glides and purpose. Pt verbalized understanding.  Therapeutic activities:  OT initiated theraputty HEP (red theraputty) -  to improve LUE  strengthening, decrease pain. Pt demo'd understanding. OT educated pt on joint protection, neutral positioning of wrist, "o" shape of digits and thumb when demo'ing tip pinch and three jaw chuck. Pt verbalized and demo'd understanding. OT also educated pt on heat/cold modalities (ice, heat pack 10 to 20 minutes as tolerated), to decrease pain after exercise, working within pain tolerance, building reps/sets of HEP over time as appropriate, listening to body and adjusting reps/sets as appropriate. Pt verbalized understanding.  Access Code: [email or web address] URL: [email or web address] Date: 02/28/2023 Prepared by: Carilyn Goodpasture  Exercises  - Putty Squeezes  -10 reps - Hook Fist with Putty  -  10 reps - Tip Pinch with Putty  - 10 reps - Finger Pinch and Pull with Putty  -10 reps - Seated Three Finger Pinch with Putty  - 10 reps - Seated Finger Extension with Putty  - 10 reps - Finger Extension with Putty  - 10 reps - Marble Pick Up from Putty  - 10 reps  PATIENT EDUCATION: Education details: see today's treatment above Person educated: Patient Education method: Explanation, Demonstration, and Handouts Education comprehension: verbalized understanding and returned demonstration  HOME EXERCISE PROGRAM: 02/24/23 - tendon glides - handout 02/28/23 - joint protection - handout (see pt instructions), theraputty (red) -  handout - Access Code: [email or web address]  GOALS: Goals reviewed with patient? Yes  SHORT TERM GOALS: Target date: 03/24/23  Patient will ind demonstrate understanding of updated LUE HEP. Baseline: new to outpt OT Goal status: INITIAL  2.  Patient will demonstrate at least 5 lbs increase of LUE grip strength as needed to open jars and other containers.  Baseline: Left: 30.6 lbs Goal status: INITIAL  3.  Pt will independently recall at least 3 total joint protection, ergonomic, and body mechanic principles.   Baseline: new to outpt OT Goal status: INITIAL    LONG TERM GOALS: Target date: 04/21/23  Pt will report ind opening jars with no difficulty using A/E and joint protection strategies PRN. Baseline: QuickDASH - pt reported "mild difficulty" with opening jars. Goal status: INITIAL  2.  Pt will report no more than 1/10 pain when using aerosol deodorant during daily grooming tasks. Baseline: Pt currently reports up to 4/10 pain at worst during the day: "mostly annoying, a slight twinge." Goal status: INITIAL    ASSESSMENT:  CLINICAL IMPRESSION: Pt tolerated OT session tasks well today. Pt would benefit from skilled OT services in the outpatient setting to work on impairments as noted below to help pt return to PLOF as able.      PERFORMANCE DEFICITS: in functional skills including ADLs, IADLs, strength, pain, Fine motor control, Gross motor control, body mechanics, endurance, and UE functional use, cognitive skills including  none , and psychosocial skills including environmental adaptation.   IMPAIRMENTS: are limiting patient from ADLs, IADLs, and work.   COMORBIDITIES: may have co-morbidities  that affects occupational performance. Patient will benefit from skilled OT to address above impairments and improve overall function.  MODIFICATION OR ASSISTANCE TO COMPLETE EVALUATION: No modification of tasks or assist necessary to complete an evaluation.  OT OCCUPATIONAL PROFILE AND  HISTORY: Problem focused assessment: Including review of records relating to presenting problem.  CLINICAL DECISION MAKING: LOW - limited treatment options, no task modification necessary  REHAB POTENTIAL: Excellent  EVALUATION COMPLEXITY: Low      PLAN:  OT FREQUENCY: 1x/week  OT DURATION: 6 weeks (dates extended to allow for scheduling)  PLANNED INTERVENTIONS: 16109 OT Re-evaluation, 97535 self care/ADL training, 60454 therapeutic exercise, [phone]  therapeutic activity, 97112 neuromuscular re-education, 97140 manual therapy, 97035 ultrasound, 56213 paraffin, 97039 fluidotherapy, 97010 moist heat, 97010 cryotherapy, 97034 contrast bath, 97032 electrical stimulation (manual), 97014 electrical stimulation unattended, 97760 Orthotics management and training, 97760 Splinting (initial encounter), [phone] Subsequent splinting/medication, manual lymph drainage, scar mobilization, passive range of motion, functional mobility training, compression bandaging, energy conservation, patient/family education, and DME and/or AE instructions  RECOMMENDED OTHER SERVICES: N/A  CONSULTED AND AGREED WITH PLAN OF CARE: Patient  PLAN FOR NEXT SESSION:   Review joint protection strategies PRN - work station Psychologist, prison and probation services, Catering manager. Review Theraputty HEP: How are theraputty exercises going?  HEP: Initiate LUE wrist/hand/digit stretches  Provide handout for heat/cold modalities to decrease pain    Wynetta Emery, OT 02/28/2023, 10:56 AM

## 2023-03-07 ENCOUNTER — Ambulatory Visit: Payer: BC Managed Care – PPO | Admitting: Physical Therapy

## 2023-03-07 ENCOUNTER — Ambulatory Visit: Payer: BC Managed Care – PPO | Admitting: Occupational Therapy

## 2023-03-07 DIAGNOSIS — M6281 Muscle weakness (generalized): Secondary | ICD-10-CM

## 2023-03-07 DIAGNOSIS — R208 Other disturbances of skin sensation: Secondary | ICD-10-CM

## 2023-03-07 DIAGNOSIS — R29898 Other symptoms and signs involving the musculoskeletal system: Secondary | ICD-10-CM

## 2023-03-07 DIAGNOSIS — G8929 Other chronic pain: Secondary | ICD-10-CM

## 2023-03-07 DIAGNOSIS — M25561 Pain in right knee: Secondary | ICD-10-CM | POA: Diagnosis not present

## 2023-03-07 NOTE — Therapy (Signed)
OUTPATIENT OCCUPATIONAL THERAPY Treatment  Patient Name: Shelby Campbell MRN: [id number] DOB:1975-12-27, 47 y.o., female Today's Date: 03/07/2023  PCP: Dorothyann Peng, MD  REFERRING PROVIDER: Pollyann Savoy, MD  END OF SESSION:  OT End of Session - 03/07/23 1746     Visit Number 3    Number of Visits 6    Date for OT Re-Evaluation 04/21/23    Authorization Type BCBS/[employer] - covered at [employer]% [employer]    OT Start Time 0930    OT Stop Time 1011    OT Time Calculation (min) 41 min    Activity Tolerance Patient tolerated treatment well    Behavior During Therapy [facility] for tasks assessed/performed               Past Medical History:  Diagnosis Date   Asthma    Chronic kidney disease    stage 3 a per lov dr Lawson Fiscal foster 10-01-2019 on chart   Hiatal hernia    History of mitral valve prolapse    no problems since birth of son 13 yrs ago (2008)   Hydronephrosis 2021   Per patient   Hypertension    Porphyria (HCC)    Porphyria (HCC)    Shortness of breath    related to exertion   Past Surgical History:  Procedure Laterality Date   AXILLARY LYMPH NODE BIOPSY  2010   right    CHOLECYSTECTOMY     CYSTOSCOPY WITH RETROGRADE PYELOGRAM, URETEROSCOPY AND STENT PLACEMENT Right 10/15/2019   Procedure: CYSTOSCOPY WITH RETROGRADE PYELOGRAM, URETEROSCOPY AND STENT PLACEMENT, attempted laser of stricture;  Surgeon: Noel Christmas, MD;  Location: [facility];  Service: Urology;  Laterality: Right;  1 HR   DILITATION & CURRETTAGE/HYSTROSCOPY WITH NOVASURE ABLATION N/A 05/04/2016   Procedure: DILATATION & CURETTAGE/HYSTEROSCOPY WITH NOVASURE ABLATION;  Surgeon: Maxie Better, MD;  Location: [facility];  Service: Gynecology;  Laterality: N/A;   LAPAROSCOPIC CHOLECYSTECTOMY SINGLE PORT N/A 09/10/2013   Procedure: LAPAROSCOPIC CHOLECYSTECTOMY SINGLE SITE WITH CHOLANGIOGRAM;  Surgeon: Ardeth Sportsman, MD;  Location: [facility];  Service: General;  Laterality: N/A;   TUBAL LIGATION      Patient Active Problem List   Diagnosis Date Noted   BMI 27.0-27.9,adult 10/18/2021   Body mass index (BMI) of 25.0 to 25.9 in adult 10/12/2020   Subconjunctival hemorrhage of right eye 10/12/2020   Chronic renal disease, stage III (HCC) 04/11/2018   Asthma 03/01/2018   Benign hypertension with chronic kidney disease 02/26/2018   Chronic gastritis, mild.  H Pylori negative 07/31/2013   Chronic cholecystitis with calculus 07/31/2013    ONSET DATE: 02/22/23 (referral date), March 2024 - date of initial injury to left index finger  REFERRING DIAG: S69.92XS (ICD-10-CM) - Injury of left index finger, sequela   THERAPY DIAG:  Muscle weakness (generalized)  Other disturbances of skin sensation  Other symptoms and signs involving the musculoskeletal system  Rationale for Evaluation and Treatment: Rehabilitation  SUBJECTIVE:   SUBJECTIVE STATEMENT: Pt reports only completing theraputty 1x this week d/t pt out of town. Pt reports difficulty with HEP finger ext with theraputty. Pt reports pain at L index finger MCP joint today.  Pt accompanied by: self  PERTINENT HISTORY: osteoarthritis, chronic renal disease stage III, asthma, [place], chronic gastritis, hiatal hernia  Per 02/22/23 MD Progress Notes: "Shelby Campbell is a 47 y.o. female with osteoarthritis and positive ANA.  She states she slammed her left index finger in March and the door.  She states that gradually the  pain went away but she still has difficulty bending her finger and she has noticed decreased strength in her finger.  She has been also experiencing pain and discomfort in her left knee.  She had evaluation by the orthopedic surgeon and was told that she has osteoarthritis."  PRECAUTIONS: None  RED FLAGS: Osteoarthritis  WEIGHT BEARING RESTRICTIONS: No  PAIN:  Are you having pain? Yes: NPRS scale: 3/10 Pain location:  L index finger MCP joint  Pain description: aching Aggravating factors:  Relieving  factors:  FALLS: Has patient fallen in last 6 months? 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  HAND DOMINANCE: Right  ADLs: Overall ADLs:  Transfers/ambulation related to ADLs: Eating: ind Grooming: ind,  difficulty with isolated L digit motion when pushing down button of aerosol deodorant UB Dressing: no difficulty, including buttons/zippers LB Dressing: no difficulty with pants, shoes, tying shoelaces Toileting: no difficulty Bathing: no difficulty Tub Shower transfers: ind Equipment: none  FUNCTIONAL OUTCOME MEASURES: Quick Dash: 15.9 points    UPPER EXTREMITY ROM:     WFL for BUE  HAND FUNCTION: Grip strength: Right: 45.1 lbs; Left: 30.6 lbs and Tip pinch: Right 9 lbs, Left: 9 lbs  COORDINATION: 9 Hole Peg test: Right: 19 sec; Left: 20 sec  SENSATION: Pt reports mild pins and needles sensation with "twinge" in distal tip of L index finger , which "comes and goes."   EDEMA: None noted during eval, pt reported some swelling near end of day sometimes.  COGNITION: Overall cognitive status: Within functional limits for tasks assessed  OBSERVATIONS: Pt ambulated ind and appeared well-kept. Pt agreeable. Pt's L index finger skin appeared intact, no redness or swelling noted. OT noted pt's L index fingernail slightly distorted shape and white line at center of nail. Pt reported half of L  index fingernail fell off after initial injury when slamming finger in car door. Pt reports fingernail has regrown since then though the nail seems weaker.   TODAY'S TREATMENT:                                                                                                                              DATE:   Therapeutic Exercises: Fluidotherapy with tendon glides  - 5 minutes - OT educated pt on UE anatomy, review of tendon glides exercises and joint protection to decrease pain. Pt verbalized understanding.  OT and pt reviewed  pt's theraputty HEP. Finger ext on tabletop removed d/t pt c/o increased discomfort of L index finger. OT educated pt on proximal stability for distal mobility, consistent strengthening each day and building reps/sets over time, and core, trunk and upper arm strengthening to improve overall UE strength. Pt verbalized understanding.  OT initiated updated HEP for wrist/digit strengthening (handout provided, [employer]): DIP blocking, PIP blocking, wrist flex/ext with 2 lb. Weight with wrist supported by cushion - 10 reps each, 2 sets each - to increase strengthening of LUE. Pt demo'd understanding. Pt c/o fatigue of L forearm during wrist ext, therefore OT recommended pt alternate sets of wrist flex and ext to decrease fatigue. Pt verbalized understanding.  OT educated pt on heat/ice modalities (handout provided, see pt instructions). Pt verbalized understanding.  Isometric place and hold for DIP and PIP flex/ext with resistance applied - to increase strengthening of LUE.  PATIENT EDUCATION: Education details: see today's treatment above Person educated: Patient Education method: Explanation, Demonstration, and Handouts Education comprehension: verbalized understanding and returned demonstration  HOME EXERCISE PROGRAM: 02/24/23 - tendon glides - handout 02/28/23 - joint protection - handout (see pt instructions), theraputty (red) - handout - Access Code:  [email or web address] 03/07/23 - heat/cold modalities handout, finger and wrist AROM - Access Code: [email or web address]  GOALS: Goals reviewed with patient? Yes  SHORT TERM GOALS: Target date: 03/24/23  Patient will ind demonstrate understanding of updated LUE HEP. Baseline: new to outpt OT Goal status: INITIAL  2.  Patient will demonstrate at least 5 lbs increase of LUE grip strength as needed to open jars and other containers.  Baseline: Left: 30.6 lbs Goal status: INITIAL  3.  Pt will independently recall at least 3 total joint protection, ergonomic, and body mechanic principles.   Baseline: new to outpt OT Goal status: INITIAL    LONG TERM GOALS: Target date: 04/21/23  Pt will report ind opening jars with no difficulty using A/E and joint protection strategies PRN. Baseline: QuickDASH - pt reported "mild difficulty" with opening jars. Goal status: INITIAL  2.  Pt will report no more than 1/10 pain when using aerosol deodorant during daily grooming tasks. Baseline: Pt currently reports up to 4/10 pain at worst during the day: "mostly annoying, a slight twinge." Goal status: INITIAL    ASSESSMENT:  CLINICAL IMPRESSION: Pt tolerated OT session tasks well today, including strengthening exercises, as needed to progress towards goals. Pt would benefit from skilled OT services in the outpatient setting to work on impairments as noted below to help pt return to PLOF as able.      PERFORMANCE DEFICITS: in functional skills including ADLs, IADLs, strength, pain, Fine motor control, Gross motor control, body mechanics, endurance, and UE functional use, cognitive skills including  none , and psychosocial skills including environmental adaptation.   IMPAIRMENTS: are limiting patient from ADLs, IADLs, and work.   COMORBIDITIES: may have co-morbidities  that affects occupational performance. Patient will benefit from skilled OT to address above impairments and improve overall function.  MODIFICATION OR  ASSISTANCE TO COMPLETE EVALUATION: No modification of tasks or assist necessary to complete an evaluation.  OT OCCUPATIONAL PROFILE AND HISTORY: Problem focused assessment: Including review of records relating to presenting problem.  CLINICAL DECISION MAKING: LOW - limited treatment options, no task modification necessary  REHAB POTENTIAL: Excellent  EVALUATION COMPLEXITY: Low      PLAN:  OT FREQUENCY: 1x/week  OT DURATION: 6 weeks (dates extended to allow for scheduling)  PLANNED INTERVENTIONS: 25956 OT Re-evaluation, 97535 self care/ADL  training, 78295 therapeutic exercise, 97530 therapeutic activity, 97112 neuromuscular re-education, 97140 manual therapy, 97035 ultrasound, 97018 paraffin, 62130 fluidotherapy, 97010 moist heat, 97010 cryotherapy, 97034 contrast bath, 97032 electrical stimulation (manual), 97014 electrical stimulation unattended, 97760 Orthotics management and training, 86578 Splinting (initial encounter), [phone] Subsequent splinting/medication, manual lymph drainage, scar mobilization, passive range of motion, functional mobility training, compression bandaging, energy conservation, patient/family education, and DME and/or AE instructions  RECOMMENDED OTHER SERVICES: N/A  CONSULTED AND AGREED WITH PLAN OF CARE: Patient  PLAN FOR NEXT SESSION:  Discuss strategies and A/E for opening jars (e.g. silicone jar openers, Dycem, etc.)  Provide work station Lexicographer  Progress strengthening PRN  Review HEP PRN   Wynetta Emery, OT 03/07/2023, 5:55 PM

## 2023-03-07 NOTE — Therapy (Signed)
OUTPATIENT PHYSICAL THERAPY LOWER EXTREMITY TREATMENT   Patient Name: Shelby Campbell MRN: [id number] DOB:04-15-1976, 47 y.o., female Today's Date: 03/07/2023  END OF SESSION:  PT End of Session - 03/07/23 1013     Visit Number 2    Number of Visits 7    Date for PT Re-Evaluation 04/21/23    Authorization Type BCBS    PT Start Time 1014    PT Stop Time 1057    PT Time Calculation (min) 43 min    Activity Tolerance Patient tolerated treatment well    Behavior During Therapy WFL for tasks assessed/performed              Past Medical History:  Diagnosis Date   Asthma    Chronic kidney disease    stage 3 a per lov dr Lawson Fiscal foster 10-01-2019 on chart   Hiatal hernia    History of mitral valve prolapse    no problems since birth of son 13 yrs ago (2008)   Hydronephrosis 2021   Per patient   Hypertension    Porphyria (HCC)    Porphyria (HCC)    Shortness of breath    related to exertion   Past Surgical History:  Procedure Laterality Date   AXILLARY LYMPH NODE BIOPSY  2010   right    CHOLECYSTECTOMY     CYSTOSCOPY WITH RETROGRADE PYELOGRAM, URETEROSCOPY AND STENT PLACEMENT Right 10/15/2019   Procedure: CYSTOSCOPY WITH RETROGRADE PYELOGRAM, URETEROSCOPY AND STENT PLACEMENT, attempted laser of stricture;  Surgeon: Noel Christmas, MD;  Location: [facility];  Service: Urology;  Laterality: Right;  1 HR   DILITATION & CURRETTAGE/HYSTROSCOPY WITH NOVASURE ABLATION N/A 05/04/2016   Procedure: DILATATION & CURETTAGE/HYSTEROSCOPY WITH NOVASURE ABLATION;  Surgeon: Maxie Better, MD;  Location: [facility];  Service: Gynecology;  Laterality: N/A;   LAPAROSCOPIC CHOLECYSTECTOMY SINGLE PORT N/A 09/10/2013   Procedure: LAPAROSCOPIC CHOLECYSTECTOMY SINGLE SITE WITH CHOLANGIOGRAM;  Surgeon: Ardeth Sportsman, MD;  Location: [facility];  Service: General;  Laterality: N/A;   TUBAL LIGATION     Patient Active Problem List   Diagnosis Date Noted   BMI 27.0-27.9,adult 10/18/2021    Body mass index (BMI) of 25.0 to 25.9 in adult 10/12/2020   Subconjunctival hemorrhage of right eye 10/12/2020   Chronic renal disease, stage III (HCC) 04/11/2018   Asthma 03/01/2018   Benign hypertension with chronic kidney disease 02/26/2018   Chronic gastritis, mild.  H Pylori negative 07/31/2013   Chronic cholecystitis with calculus 07/31/2013    PCP: Dorothyann Peng, MD  REFERRING PROVIDER: Pollyann Savoy, MD  REFERRING DIAG: M25.561,M25.562,G89.29 (ICD-10-CM) - Chronic pain of both knees  THERAPY DIAG:  Muscle weakness (generalized)  Chronic pain of both knees  Rationale for Evaluation and Treatment: Rehabilitation  ONSET DATE: 02/22/2023 (referral)   SUBJECTIVE:   SUBJECTIVE STATEMENT: Pt reports she went to a party this weekend and danced, knee hurt a little bit but it was okay. States her knee is "twingey" today, but no overt pain.   PERTINENT HISTORY: osteoarthritis and positive ANA PAIN:  Are you having pain? No  PRECAUTIONS: Fall  RED FLAGS: None   WEIGHT BEARING RESTRICTIONS: No  FALLS:  Has patient fallen in last 6 months? Yes. Number of falls 1 (fell while sparring)  LIVING ENVIRONMENT: Lives with: lives with their family and lives with their spouse Lives in: House/apartment Stairs: Yes: Internal: 16 steps; on right going up and External: 2 in garage, 1 in front steps; none Has following equipment at  home: Single point cane, Walker - 2 wheeled, and Grab bars  OCCUPATION: Engineer   PLOF: Independent  PATIENT GOALS: "to relieve the constant pain"    OBJECTIVE:  Note: Objective measures were completed at Evaluation unless otherwise noted.  DIAGNOSTIC FINDINGS: X-ray of L knee from 10/24   No medial or lateral compartment narrowing was noted.  No patellofemoral narrowing was noted.   Impression: Unremarkable x-rays of the knee.   PATIENT SURVEYS:  LEFS 58/80 (mild to moderate functional limitation)  COGNITION: Overall cognitive  status: Within functional limits for tasks assessed     SENSATION: WFL  POSTURE: No Significant postural limitations  PALPATION: No TTP along patella, patellar tendon or joint line   LOWER EXTREMITY MMT: Tested in seated position  MMT Right eval Left eval  Hip flexion 5 5  Hip extension    Hip abduction 5 5  Hip adduction 5 5  Hip internal rotation    Hip external rotation    Knee flexion 5 5  Knee extension 5 4-  Ankle dorsiflexion 5 5  Ankle plantarflexion    Ankle inversion    Ankle eversion     (Blank rows = not tested)  LOWER EXTREMITY SPECIAL TESTS:  Knee special tests: Patella tap test (ballotable patella): negative  FUNCTIONAL TESTS:  SLS: >10s bilaterally, decreased stability on LLE w/compensated trendelenburg    TODAY'S TREATMENT:     Ther Ex  The following exercises were performed for improved quad strength, single leg stability and hip strength. Added to HEP (see bolded below)  SLR w/quad set, x10 reps per side  Single leg glute bridges w/quad set on elevated leg, x10 reps per side.  Standing marching w/green resistance band around feet, x10 per side.  Fwd/lateral/retro monster walks w/green theraband around ankles, x30' per direction. Increased difficulty in retro direction.   GAIT: Distance walked: Various clinic distances  Assistive device utilized: None Level of assistance: Complete Independence   PATIENT EDUCATION:  Education details: Initial HEP Person educated: Patient Education method: Explanation Education comprehension: verbalized understanding  HOME EXERCISE PROGRAM: Access Code: [email or web address] URL: [email or web address] Date: 03/07/2023 Prepared by: Alethia Berthold Arrion Broaddus  Exercises - Active Straight Leg Raise with Quad Set  - 1 x daily - 7 x weekly - 3 sets - 10 reps - Single Leg Bridge  - 1 x daily - 7 x weekly - 3 sets - 10 reps - Marching with Resistance  - 1 x daily - 7 x weekly - 3 sets - 10 reps - Backward Monster Walks   - 1 x daily - 7 x weekly - 3 sets - 10 reps - Forward Monster Walks  - 1 x daily - 7 x weekly - 3 sets - 10 reps - Side Stepping with Resistance at Feet  - 1 x daily - 7 x weekly - 3 sets - 10 reps  ASSESSMENT:  CLINICAL IMPRESSION: Emphasis of skilled PT session on establishing initial HEP for improved quad/hip strength and single leg stability. Pt continues to be limited by decreased functional hip/quad strength of LLE, but tolerated session well w/no increase in knee pain. Will continue to work on isometric strength prior to progressing to eccentric strength. Continue POC.     OBJECTIVE IMPAIRMENTS: decreased activity tolerance, decreased endurance, decreased mobility, difficulty walking, decreased strength, and pain.   ACTIVITY LIMITATIONS: bending, sitting, standing, squatting, stairs, and locomotion level  PARTICIPATION LIMITATIONS: shopping, community activity, occupation, and yard work  PERSONAL FACTORS: Fitness are also affecting patient's functional outcome.  REHAB POTENTIAL: Good  CLINICAL DECISION MAKING: Stable/uncomplicated  EVALUATION COMPLEXITY: Low   GOALS: Goals reviewed with patient? Yes  SHORT TERM GOALS: Target date: 03/24/2023  Pt will be independent with initial HEP for improved strength and pain management   Baseline: Goal status: INITIAL  LONG TERM GOALS: Target date: 04/07/2023   Pt will return to [place] for improved strength and return to PLOF  Baseline: Pt has not returned since May  Goal status: INITIAL  2.  Pt will improve score on LEFS to >/= 68/80 for reduced pain levels and improved functional mobility Baseline: 58/80 Goal status: INITIAL   PLAN:  PT FREQUENCY: 1x/week  PT DURATION: 6 weeks (POC written for 8 weeks due to delay in scheduling)   PLANNED INTERVENTIONS: 19147- PT Re-evaluation, 97110-Therapeutic exercises, 97530- Therapeutic activity, 97112- Neuromuscular re-education, 97535- Self Care, 82956- Manual therapy,  [id number]- Gait training, [id number]- Aquatic Therapy, [phone]- Electrical stimulation (manual), Balance training, Stair training, Taping, and Dry Needling  PLAN FOR NEXT SESSION: Add to HEP for L quad and hip abduction strength. Single leg stability, jumping, boxing tasks    Ellese Julius E Candon Caras, PT, DPT 03/07/2023, 11:00 AM

## 2023-03-14 ENCOUNTER — Ambulatory Visit: Payer: BC Managed Care – PPO | Attending: Internal Medicine | Admitting: Occupational Therapy

## 2023-03-14 ENCOUNTER — Ambulatory Visit: Payer: BC Managed Care – PPO | Admitting: Physical Therapy

## 2023-03-14 DIAGNOSIS — R2689 Other abnormalities of gait and mobility: Secondary | ICD-10-CM | POA: Diagnosis present

## 2023-03-14 DIAGNOSIS — R208 Other disturbances of skin sensation: Secondary | ICD-10-CM | POA: Diagnosis present

## 2023-03-14 DIAGNOSIS — G8929 Other chronic pain: Secondary | ICD-10-CM | POA: Insufficient documentation

## 2023-03-14 DIAGNOSIS — M25562 Pain in left knee: Secondary | ICD-10-CM | POA: Diagnosis present

## 2023-03-14 DIAGNOSIS — M6281 Muscle weakness (generalized): Secondary | ICD-10-CM

## 2023-03-14 DIAGNOSIS — M25561 Pain in right knee: Secondary | ICD-10-CM | POA: Insufficient documentation

## 2023-03-14 DIAGNOSIS — R29898 Other symptoms and signs involving the musculoskeletal system: Secondary | ICD-10-CM | POA: Insufficient documentation

## 2023-03-14 NOTE — Therapy (Signed)
OUTPATIENT PHYSICAL THERAPY LOWER EXTREMITY TREATMENT   Patient Name: IRELAND VIRRUETA MRN: [id number] DOB:Jan 19, 1976, 47 y.o., female Today's Date: 03/14/2023  END OF SESSION:  PT End of Session - 03/14/23 0934     Visit Number 3    Number of Visits 7    Date for PT Re-Evaluation 04/21/23    Authorization Type BCBS    PT Start Time 0932    PT Stop Time 1014    PT Time Calculation (min) 42 min    Activity Tolerance Patient tolerated treatment well    Behavior During Therapy [facility] for tasks assessed/performed               Past Medical History:  Diagnosis Date   Asthma    Chronic kidney disease    stage 3 a per lov dr Lawson Fiscal foster 10-01-2019 on chart   Hiatal hernia    History of mitral valve prolapse    no problems since birth of son 13 yrs ago (2008)   Hydronephrosis 2021   Per patient   Hypertension    Porphyria (HCC)    Porphyria (HCC)    Shortness of breath    related to exertion   Past Surgical History:  Procedure Laterality Date   AXILLARY LYMPH NODE BIOPSY  2010   right    CHOLECYSTECTOMY     CYSTOSCOPY WITH RETROGRADE PYELOGRAM, URETEROSCOPY AND STENT PLACEMENT Right 10/15/2019   Procedure: CYSTOSCOPY WITH RETROGRADE PYELOGRAM, URETEROSCOPY AND STENT PLACEMENT, attempted laser of stricture;  Surgeon: Noel Christmas, MD;  Location: [facility];  Service: Urology;  Laterality: Right;  1 HR   DILITATION & CURRETTAGE/HYSTROSCOPY WITH NOVASURE ABLATION N/A 05/04/2016   Procedure: DILATATION & CURETTAGE/HYSTEROSCOPY WITH NOVASURE ABLATION;  Surgeon: Maxie Better, MD;  Location: [facility];  Service: Gynecology;  Laterality: N/A;   LAPAROSCOPIC CHOLECYSTECTOMY SINGLE PORT N/A 09/10/2013   Procedure: LAPAROSCOPIC CHOLECYSTECTOMY SINGLE SITE WITH CHOLANGIOGRAM;  Surgeon: Ardeth Sportsman, MD;  Location: [facility];  Service: General;  Laterality: N/A;   TUBAL LIGATION     Patient Active Problem List   Diagnosis Date Noted   BMI 27.0-27.9,adult 10/18/2021    Body mass index (BMI) of 25.0 to 25.9 in adult 10/12/2020   Subconjunctival hemorrhage of right eye 10/12/2020   Chronic renal disease, stage III (HCC) 04/11/2018   Asthma 03/01/2018   Benign hypertension with chronic kidney disease 02/26/2018   Chronic gastritis, mild.  H Pylori negative 07/31/2013   Chronic cholecystitis with calculus 07/31/2013    PCP: Dorothyann Peng, MD  REFERRING PROVIDER: Pollyann Savoy, MD  REFERRING DIAG: M25.561,M25.562,G89.29 (ICD-10-CM) - Chronic pain of both knees  THERAPY DIAG:  Muscle weakness (generalized)  Chronic pain of both knees  Other abnormalities of gait and mobility  Rationale for Evaluation and Treatment: Rehabilitation  ONSET DATE: 02/22/2023 (referral)   SUBJECTIVE:   SUBJECTIVE STATEMENT: Pt reports doing well. Denies pain or acute changes. States her knees feel better after her exercises.   PERTINENT HISTORY: osteoarthritis and positive ANA PAIN:  Are you having pain? No  PRECAUTIONS: Fall  RED FLAGS: None   WEIGHT BEARING RESTRICTIONS: No  FALLS:  Has patient fallen in last 6 months? Yes. Number of falls 1 (fell while sparring)  LIVING ENVIRONMENT: Lives with: lives with their family and lives with their spouse Lives in: House/apartment Stairs: Yes: Internal: 16 steps; on right going up and External: 2 in garage, 1 in front steps; none Has following equipment at home: Single point cane, Walker -  2 wheeled, and Grab bars  OCCUPATION: Engineer   PLOF: Independent  PATIENT GOALS: "to relieve the constant pain"    OBJECTIVE:  Note: Objective measures were completed at Evaluation unless otherwise noted.  DIAGNOSTIC FINDINGS: X-ray of L knee from 10/24   No medial or lateral compartment narrowing was noted.  No patellofemoral narrowing was noted.   Impression: Unremarkable x-rays of the knee.   PATIENT SURVEYS:  LEFS 58/80 (mild to moderate functional limitation)  COGNITION: Overall cognitive  status: Within functional limits for tasks assessed     SENSATION: WFL  POSTURE: No Significant postural limitations  PALPATION: No TTP along patella, patellar tendon or joint line   LOWER EXTREMITY MMT: Tested in seated position  MMT Right eval Left eval  Hip flexion 5 5  Hip extension    Hip abduction 5 5  Hip adduction 5 5  Hip internal rotation    Hip external rotation    Knee flexion 5 5  Knee extension 5 4-  Ankle dorsiflexion 5 5  Ankle plantarflexion    Ankle inversion    Ankle eversion     (Blank rows = not tested)  LOWER EXTREMITY SPECIAL TESTS:  Knee special tests: Patella tap test (ballotable patella): negative  FUNCTIONAL TESTS:  SLS: >10s bilaterally, decreased stability on LLE w/compensated trendelenburg    TODAY'S TREATMENT:     Ther Ex  SciFit multi-peaks level 10 for 8 minutes using BLEs only for dynamic cardiovascular warmup and global strength.  Alt step ups w/contralateral march on bosu (blue side) w/5s isometric hold for improved single leg stability, ankle strength and glute med strength. Noted ER of RLE and genu valgus position of R knee throughout.  Single leg step ups to 8" box holding 25# KB in contralateral hand, x12 reps per side for improved functional glute strength and single leg stability. Pt reported feeling less stable when RLE was stance leg.  Hip abduction isometric hold at wall using yellow theraball for improved hip strength and single leg stability. Increased difficulty when standing on RLE. 4 reps of 15s holds performed each side.  Spanish squats w/blue theraband for improved quad strength, x12 reps w/band around both knees and x10 reps per side w/band around single knee. Noted weight shift to R side throughout and increased instability when band around RLE.    GAIT: Distance walked: Various clinic distances  Assistive device utilized: None Level of assistance: Complete Independence   PATIENT EDUCATION:  Education details:  Continue HEP Person educated: Patient Education method: Explanation Education comprehension: verbalized understanding  HOME EXERCISE PROGRAM: Access Code: [email or web address] URL: [email or web address] Date: 03/07/2023 Prepared by: Alethia Berthold Ece C[place]  Exercises - Active Straight Leg Raise with Quad Set  - 1 x daily - 7 x weekly - 3 sets - 10 reps - Single Leg Bridge  - 1 x daily - 7 x weekly - 3 sets - 10 reps - Marching with Resistance  - 1 x daily - 7 x weekly - 3 sets - 10 reps - Backward Monster Walks  - 1 x daily - 7 x weekly - 3 sets - 10 reps - Forward Monster Walks  - 1 x daily - 7 x weekly - 3 sets - 10 reps - Side Stepping with Resistance at Feet  - 1 x daily - 7 x weekly - 3 sets - 10 reps  ASSESSMENT:  CLINICAL IMPRESSION: Emphasis of skilled PT session on single leg stability, functional hip strength and quad strength. Pt demonstrates significant hip  abductor and quad weakness on RLE w/trendelenburg compensations noted throughout session, resulting in low back discomfort. Pt will continue to benefit from functional strength training and core stability for pain modulation and return to gym activity. Continue POC.     OBJECTIVE IMPAIRMENTS: decreased activity tolerance, decreased endurance, decreased mobility, difficulty walking, decreased strength, and pain.   ACTIVITY LIMITATIONS: bending, sitting, standing, squatting, stairs, and locomotion level  PARTICIPATION LIMITATIONS: shopping, community activity, occupation, and yard work  PERSONAL FACTORS: Fitness are also affecting patient's functional outcome.   REHAB POTENTIAL: Good  CLINICAL DECISION MAKING: Stable/uncomplicated  EVALUATION COMPLEXITY: Low   GOALS: Goals reviewed with patient? Yes  SHORT TERM GOALS: Target date: 03/24/2023  Pt will be independent with initial HEP for improved strength and pain management   Baseline: Goal status: INITIAL  LONG TERM GOALS: Target date: 04/07/2023   Pt will  return to [place] for improved strength and return to PLOF  Baseline: Pt has not returned since May  Goal status: INITIAL  2.  Pt will improve score on LEFS to >/= 68/80 for reduced pain levels and improved functional mobility Baseline: 58/80 Goal status: INITIAL   PLAN:  PT FREQUENCY: 1x/week  PT DURATION: 6 weeks (POC written for 8 weeks due to delay in scheduling)   PLANNED INTERVENTIONS: 08657- PT Re-evaluation, 97110-Therapeutic exercises, 97530- Therapeutic activity, 97112- Neuromuscular re-education, 97535- Self Care, 84696- Manual therapy, [id number]- Gait training, [id number]- Aquatic Therapy, [phone]- Electrical stimulation (manual), Balance training, Stair training, Taping, and Dry Needling  PLAN FOR NEXT SESSION: Add to HEP for L quad and hip abduction strength. Single leg stability, jumping, boxing tasks, core stability    Namine Beahm E Zeus Marquis, PT, DPT 03/14/2023, 10:15 AM

## 2023-03-14 NOTE — Therapy (Signed)
OUTPATIENT OCCUPATIONAL THERAPY Treatment  Patient Name: Shelby Campbell MRN: [id number] DOB:04-Aug-1975, 47 y.o., female Today's Date: 03/14/2023  PCP: Dorothyann Peng, MD  REFERRING PROVIDER: Pollyann Savoy, MD  END OF SESSION:  OT End of Session - 03/14/23 0933     Visit Number 4    Number of Visits 6    Date for OT Re-Evaluation 04/21/23    Authorization Type BCBS/T[employer] - covered at 100% [employer]    OT Start Time 1020    OT Stop Time 1103    OT Time Calculation (min) 43 min    Activity Tolerance Patient tolerated treatment well    Behavior During Therapy [facility] for tasks assessed/performed            Past Medical History:  Diagnosis Date   Asthma    Chronic kidney disease    stage 3 a per lov dr Lawson Fiscal foster 10-01-2019 on chart   Hiatal hernia    History of mitral valve prolapse    no problems since birth of son 13 yrs ago (2008)   Hydronephrosis 2021   Per patient   Hypertension    Porphyria (HCC)    Porphyria (HCC)    Shortness of breath    related to exertion   Past Surgical History:  Procedure Laterality Date   AXILLARY LYMPH NODE BIOPSY  2010   right    CHOLECYSTECTOMY     CYSTOSCOPY WITH RETROGRADE PYELOGRAM, URETEROSCOPY AND STENT PLACEMENT Right 10/15/2019   Procedure: CYSTOSCOPY WITH RETROGRADE PYELOGRAM, URETEROSCOPY AND STENT PLACEMENT, attempted laser of stricture;  Surgeon: Noel Christmas, MD;  Location: [facility];  Service: Urology;  Laterality: Right;  1 HR   DILITATION & CURRETTAGE/HYSTROSCOPY WITH NOVASURE ABLATION N/A 05/04/2016   Procedure: DILATATION & CURETTAGE/HYSTEROSCOPY WITH NOVASURE ABLATION;  Surgeon: Maxie Better, MD;  Location: [facility];  Service: Gynecology;  Laterality: N/A;   LAPAROSCOPIC CHOLECYSTECTOMY SINGLE PORT N/A 09/10/2013   Procedure: LAPAROSCOPIC CHOLECYSTECTOMY SINGLE SITE WITH CHOLANGIOGRAM;  Surgeon: Ardeth Sportsman, MD;  Location: [facility];  Service: General;  Laterality: N/A;   TUBAL LIGATION      Patient Active Problem List   Diagnosis Date Noted   BMI 27.0-27.9,adult 10/18/2021   Body mass index (BMI) of 25.0 to 25.9 in adult 10/12/2020   Subconjunctival hemorrhage of right eye 10/12/2020   Chronic renal disease, stage III (HCC) 04/11/2018   Asthma 03/01/2018   Benign hypertension with chronic kidney disease 02/26/2018   Chronic gastritis, mild.  H Pylori negative 07/31/2013   Chronic cholecystitis with calculus 07/31/2013    ONSET DATE: 02/22/23 (referral date), March 2024 - date of initial injury to left index finger  REFERRING DIAG: S69.92XS (ICD-10-CM) - Injury of left index finger, sequela   THERAPY DIAG:  Muscle weakness (generalized)  Other disturbances of skin sensation  Other symptoms and signs involving the musculoskeletal system  Rationale for Evaluation and Treatment: Rehabilitation  SUBJECTIVE:   SUBJECTIVE STATEMENT: Pt reports difficulty opening water bottles and spraying spray on deodorant.   Pt accompanied by: self  PERTINENT HISTORY: osteoarthritis, chronic renal disease stage III, asthma, [place], chronic gastritis, hiatal hernia  Per 02/22/23 MD Progress Notes: "BERT GIVANS is a 47 y.o. female with osteoarthritis and positive ANA.  She states she slammed her left index finger in March and the door.  She states that gradually the pain went away but she still has difficulty bending her finger and she has noticed decreased strength in her finger.  She has  been also experiencing pain and discomfort in her left knee.  She had evaluation by the orthopedic surgeon and was told that she has osteoarthritis."  PRECAUTIONS: None  RED FLAGS: Osteoarthritis  WEIGHT BEARING RESTRICTIONS: No  PAIN:  Are you having pain? Yes: NPRS scale: 3/10 Pain location:  L index finger MCP joint  Pain description: aching Aggravating factors:  Relieving factors:  FALLS: Has patient fallen in last 6 months? 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  HAND DOMINANCE: Right  ADLs: Overall ADLs:  Transfers/ambulation related to ADLs: Eating: ind Grooming: ind,  difficulty with isolated L digit motion when pushing down button of aerosol deodorant UB Dressing: no difficulty, including buttons/zippers LB Dressing: no difficulty with pants, shoes, tying shoelaces Toileting: no difficulty Bathing: no difficulty Tub Shower transfers: ind Equipment: none  FUNCTIONAL OUTCOME MEASURES: Quick Dash: 15.9 points    UPPER EXTREMITY ROM:     WFL for BUE  HAND FUNCTION: Grip strength: Right: 45.1 lbs; Left: 30.6 lbs and Tip pinch: Right 9 lbs, Left: 9 lbs  COORDINATION: 9 Hole Peg test: Right: 19 sec; Left: 20 sec  SENSATION: Pt reports mild pins and needles sensation with "twinge" in distal tip of L index finger , which "comes and goes."   EDEMA: None noted during eval, pt reported some swelling near end of day sometimes.  COGNITION: Overall cognitive status: Within functional limits for tasks assessed  OBSERVATIONS: Pt ambulated ind and appeared well-kept. Pt agreeable. Pt's L index finger skin appeared intact, no redness or swelling noted. OT noted pt's L index fingernail slightly distorted shape and white line at center of nail. Pt reported half of L index fingernail fell off after initial injury when slamming finger in car door. Pt reports fingernail has  regrown since then though the nail seems weaker.  TODAY'S TREATMENT:                                                                                                                              OT educated pt on work and general ergonomic recommendations to help with pain as noted in pt instructions.   OT educated pt on use of Dycem with opening containers. Education provided on water bottle opener as additional option. Pt encouraged to place rubber band around spray deodorant for additional leverage and added index finger press downs with putty for strengthening. Cues to  flex at DIP with completion.   - Ultrasound completed for duration as noted below including:  Ultrasound applied to palmar and dorsal left hand and digits for 8 minutes, frequency of 3 MHz, 20% duty cycle, and 1.1 W/cm with pt's arm placed on soft towel for promotion of ROM, edema reduction, and pain reduction in affected extremity. PATIENT EDUCATION: Education details: see today's treatment above Person educated: Patient Education method: Explanation, Demonstration, and Handouts Education comprehension: verbalized understanding and returned demonstration  HOME EXERCISE PROGRAM: 02/24/23 - tendon glides - handout 02/28/23 - joint protection - handout (see pt instructions), theraputty (red) - handout - Access Code: [email or web address] 03/07/23 - heat/cold modalities handout, finger and wrist AROM - Access Code: [email or web address]  GOALS: Goals reviewed with patient? Yes  SHORT TERM GOALS: Target date: 03/24/23  Patient will ind demonstrate understanding of updated LUE HEP. Baseline: new to outpt OT Goal status: INITIAL  2.  Patient will demonstrate at least 5 lbs increase of LUE grip strength as needed to open jars and other containers.  Baseline: Left: 30.6 lbs Goal status: INITIAL  3.  Pt will independently recall at least 3 total joint protection, ergonomic, and body mechanic principles.   Baseline: new to outpt OT Goal status:  INITIAL   LONG TERM GOALS: Target date: 04/21/23  Pt will report ind opening jars with no difficulty using A/E and joint protection strategies PRN. Baseline: QuickDASH - pt reported "mild difficulty" with opening jars. Goal status: INITIAL  2.  Pt will report no more than 1/10 pain when using aerosol deodorant during daily grooming tasks. Baseline: Pt currently reports up to 4/10 pain at worst during the day: "mostly annoying, a slight twinge." Goal status: INITIAL  ASSESSMENT:  CLINICAL IMPRESSION: Pt demonstrates good understanding of strengthening and modifications as needed for more independent completion of ADLs and IADLs. Pt to benefit from use of [place] for joint pain in L index finger. Recommend additional applications.    PERFORMANCE DEFICITS: in functional skills including ADLs, IADLs, strength, pain, Fine motor control, Gross motor control, body mechanics, endurance, and UE functional use, cognitive skills including  none , and psychosocial skills including environmental adaptation.   IMPAIRMENTS: are limiting patient from ADLs, IADLs, and work.   COMORBIDITIES: may have co-morbidities  that affects occupational performance. Patient will benefit from skilled OT to address above impairments and improve overall function.  REHAB POTENTIAL: Excellent  PLAN:  OT FREQUENCY: 1x/week  OT DURATION: 6 weeks (dates extended to allow for scheduling)  PLANNED INTERVENTIONS: 97168 OT Re-evaluation, 97535 self care/ADL training, 81191 therapeutic exercise, 97530 therapeutic activity, 97112 neuromuscular re-education, 97140 manual therapy, 97035 ultrasound, 97018 paraffin, 47829 fluidotherapy, 97010 moist heat, 97010 cryotherapy, 97034 contrast bath, 97032 electrical stimulation (manual), 97014 electrical stimulation unattended, 97760 Orthotics management and training, 56213 Splinting (initial encounter), [id number] Subsequent splinting/medication, manual lymph drainage, scar mobilization, passive  range of motion, functional mobility training, compression bandaging, energy conservation, patient/family education, and DME and/or AE instructions  RECOMMENDED OTHER SERVICES: N/A  CONSULTED AND AGREED WITH PLAN OF CARE: Patient  PLAN FOR NEXT SESSION: [place] - was it helpful?  Progress strengthening PRN  Review HEP PRN   Delana Meyer, OT 03/14/2023, 12:45 PM

## 2023-03-21 ENCOUNTER — Ambulatory Visit: Payer: BC Managed Care – PPO | Admitting: Occupational Therapy

## 2023-03-21 ENCOUNTER — Ambulatory Visit: Payer: BC Managed Care – PPO | Admitting: Physical Therapy

## 2023-03-21 DIAGNOSIS — R208 Other disturbances of skin sensation: Secondary | ICD-10-CM

## 2023-03-21 DIAGNOSIS — M6281 Muscle weakness (generalized): Secondary | ICD-10-CM

## 2023-03-21 DIAGNOSIS — R2689 Other abnormalities of gait and mobility: Secondary | ICD-10-CM

## 2023-03-21 DIAGNOSIS — G8929 Other chronic pain: Secondary | ICD-10-CM

## 2023-03-21 DIAGNOSIS — R29898 Other symptoms and signs involving the musculoskeletal system: Secondary | ICD-10-CM

## 2023-03-21 NOTE — Therapy (Signed)
OUTPATIENT OCCUPATIONAL THERAPY Treatment  Patient Name: Shelby Campbell MRN: [id number] DOB:28-Oct-1975, 47 y.o., female Today's Date: 03/21/2023  PCP: Dorothyann Peng, MD  REFERRING PROVIDER: Pollyann Savoy, MD  END OF SESSION:  OT End of Session - 03/21/23 1115     Visit Number 5    Number of Visits 6    Date for OT Re-Evaluation 04/21/23    Authorization Type BCBS/[employer] - covered at 100% LL    OT Start Time 1017    OT Stop Time 1057    OT Time Calculation (min) 40 min    Activity Tolerance Patient tolerated treatment well    Behavior During Therapy [facility] for tasks assessed/performed             Past Medical History:  Diagnosis Date   Asthma    Chronic kidney disease    stage 3 a per lov dr Lawson Fiscal foster 10-01-2019 on chart   Hiatal hernia    History of mitral valve prolapse    no problems since birth of son 13 yrs ago (2008)   Hydronephrosis 2021   Per patient   Hypertension    Porphyria (HCC)    Porphyria (HCC)    Shortness of breath    related to exertion   Past Surgical History:  Procedure Laterality Date   AXILLARY LYMPH NODE BIOPSY  2010   right    CHOLECYSTECTOMY     CYSTOSCOPY WITH RETROGRADE PYELOGRAM, URETEROSCOPY AND STENT PLACEMENT Right 10/15/2019   Procedure: CYSTOSCOPY WITH RETROGRADE PYELOGRAM, URETEROSCOPY AND STENT PLACEMENT, attempted laser of stricture;  Surgeon: Noel Christmas, MD;  Location: [facility];  Service: Urology;  Laterality: Right;  1 HR   DILITATION & CURRETTAGE/HYSTROSCOPY WITH NOVASURE ABLATION N/A 05/04/2016   Procedure: DILATATION & CURETTAGE/HYSTEROSCOPY WITH NOVASURE ABLATION;  Surgeon: Maxie Better, MD;  Location: [facility];  Service: Gynecology;  Laterality: N/A;   LAPAROSCOPIC CHOLECYSTECTOMY SINGLE PORT N/A 09/10/2013   Procedure: LAPAROSCOPIC CHOLECYSTECTOMY SINGLE SITE WITH CHOLANGIOGRAM;  Surgeon: Ardeth Sportsman, MD;  Location: [facility];  Service: General;  Laterality: N/A;   TUBAL LIGATION      Patient Active Problem List   Diagnosis Date Noted   BMI 27.0-27.9,adult 10/18/2021   Body mass index (BMI) of 25.0 to 25.9 in adult 10/12/2020   Subconjunctival hemorrhage of right eye 10/12/2020   Chronic renal disease, stage III (HCC) 04/11/2018   Asthma 03/01/2018   Benign hypertension with chronic kidney disease 02/26/2018   Chronic gastritis, mild.  H Pylori negative 07/31/2013   Chronic cholecystitis with calculus 07/31/2013    ONSET DATE: 02/22/23 (referral date), March 2024 - date of initial injury to left index finger  REFERRING DIAG: S69.92XS (ICD-10-CM) - Injury of left index finger, sequela   THERAPY DIAG:  Muscle weakness (generalized)  Other disturbances of skin sensation  Other symptoms and signs involving the musculoskeletal system  Rationale for Evaluation and Treatment: Rehabilitation  SUBJECTIVE:   SUBJECTIVE STATEMENT: Pt reports completing joint blocking exercises. Pt reported not yet attempting to add rubber band to aerosol deodorant. Pt reported some success with using L thumb for aerosol deodorant though preference to use L index finger. Pt reports not consistently completing theraputty exercises d/t traveling frequently. Pt reports using Dycem to open water bottle, which has been working well.  Pt accompanied by: self  PERTINENT HISTORY: osteoarthritis, chronic renal disease stage III, asthma, [place], chronic gastritis, hiatal hernia  Per 02/22/23 MD Progress Notes: "[name] is a 47 y.o. female  with osteoarthritis and positive ANA.  She states she slammed her left index finger in March and the door.  She states that gradually the pain went away but she still has difficulty bending her finger and she has noticed decreased strength in her finger.  She has been also experiencing pain and discomfort in her left knee.  She had evaluation by the orthopedic surgeon and was told that she has osteoarthritis."  PRECAUTIONS: None  RED  FLAGS: Osteoarthritis  WEIGHT BEARING RESTRICTIONS: No  PAIN:  Are you having pain? Yes: NPRS scale: 1/10 Pain location:  L index finger MCP joint  Pain description: "twinge" Aggravating factors:  Relieving factors:  FALLS: Has patient fallen in last 6 months? 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  HAND DOMINANCE: Right  ADLs: Overall ADLs:  Transfers/ambulation related to ADLs: Eating: ind Grooming: ind,  difficulty with isolated L digit motion when pushing down button of aerosol deodorant UB Dressing: no difficulty, including buttons/zippers LB Dressing: no difficulty with pants, shoes, tying shoelaces Toileting: no difficulty Bathing: no difficulty Tub Shower transfers: ind Equipment: none  FUNCTIONAL OUTCOME MEASURES: Quick Dash: 15.9 points    UPPER EXTREMITY ROM:     WFL for BUE  HAND FUNCTION: Grip strength: Right: 45.1 lbs; Left: 30.6 lbs and Tip pinch: Right 9 lbs, Left: 9 lbs  COORDINATION: 9 Hole Peg test: Right: 19 sec; Left: 20 sec  SENSATION: Pt reports mild pins and needles sensation with "twinge" in distal tip of L index finger , which "comes and goes."   EDEMA: None noted during eval, pt reported some swelling near end of day sometimes.  COGNITION: Overall cognitive status: Within functional limits for tasks assessed  OBSERVATIONS: Pt ambulated ind and  appeared well-kept. Pt agreeable. Pt's L index finger skin appeared intact, no redness or swelling noted. OT noted pt's L index fingernail slightly distorted shape and white line at center of nail. Pt reported half of L index fingernail fell off after initial injury when slamming finger in car door. Pt reports fingernail has regrown since then though the nail seems weaker.  TODAY'S TREATMENT:                                                                                                                               TherEx: Pt reported improvement after ultrasound last OT session though reported preference for fluidotherapy  today. Therefore, fluidotherapy with tendon glides - 10 minutes timed - to decrease LUE pain, improve AROM, preparation for therapeutic tasks.  Therapeutic Activities: OT educated on joint protection and body mechanics/ergonomics principles. OT educated pt on standing desk options. Pt verbalized understanding though reported standing desks may take up too much space at office. OT educated pt on cutting L index finger nail to ensure nail is even at site of injury to prevent nail from "catching." Pt acknowledged understanding and reported nail grows unevenly d/t initial injury to L index finger. OT educated pt on additional jar opener options to open jars and containers with joint protection. OT showed pt some examples online (see pt instructions). Pt verbalized understanding.   Assessed pt's progress towards goals, see below. Pt making excellent progress towards goals.  Pt and OT discussed potential OT D/C next visit. Pt reports L index finger strength is improving and pain is decreasing. OT recommended completing HEP to maintain progress and reviewed LUE theraputty and AROM HEP. Pt verbalized understanding and demo'd understanding of exercises. Pt to come for one more OT visit to review joint protection strategies, ensure understanding of HEP, progress wrist strengthening HEP, and to  answer any remaining questions. Pt verbalized understanding of all.  PATIENT EDUCATION: Education details: see today's treatment above, joint protection and body mechanics/ergonomics Person educated: Patient Education method: Explanation, Demonstration, and Handouts Education comprehension: verbalized understanding and returned demonstration  HOME EXERCISE PROGRAM: 02/24/23 - tendon glides - handout 02/28/23 - joint protection - handout (see pt instructions), theraputty (red) - handout - Access Code: [email or web address] 03/07/23 - heat/cold modalities handout, finger and wrist AROM - Access Code: [email or web address] 03/21/23 - additional copies of theraputty and digit/wrist AROM HEP from 02/28/23 and 03/07/23  GOALS: Goals reviewed with patient? Yes  SHORT TERM GOALS: Target date: 03/24/23  Patient will ind demonstrate understanding of updated LUE HEP. Baseline: new to outpt OT 03/21/23 - Pt ind demo'd understanding of theraputty, joint blocking, finger and wrist AROM HEP using only visual handouts. OT provided pt with additional copies of HEP handouts. Goal status: 03/21/23 - MET  2.  Patient will demonstrate at least 5 lbs increase of LUE grip strength as needed to open jars and other containers.  Baseline: Left: 30.6 lbs 03/21/23 - Left: 35.7 lbs - Pt reported no increase in pain during grip strength testing. Goal status: 03/21/23 - MET  3.  Pt will independently recall at least 3 total joint protection, ergonomic, and body mechanic principles.   Baseline: new to outpt OT 03/21/23 - Pt ind verbalized at least 3 joint protection strategies and ergonomic principles: (Adjusting computer setup, using Dycem to open jars/bottles, using larger joints instead of fingertips to complete daily tasks). Goal status: 03/21/23 - MET   LONG TERM GOALS: Target date: 04/21/23  Pt will report ind opening jars with no difficulty using A/E and joint protection strategies PRN. Baseline: QuickDASH - pt reported "mild  difficulty" with opening jars. 03/21/23 - Pt reported no difficulty when using Dycem to open jars and water bottles. Goal status: 03/21/23 - MET  2.  Pt will report no more than 1/10 pain when using aerosol deodorant during daily grooming tasks. Baseline: Pt currently reports up to 4/10 pain at worst during the day: "mostly annoying, a slight twinge."  03/21/23 - Pt reports continuing "twinge" when pushing down aerosol deodorant with L index finger though decreased pain to 1/10 pain when completing daily grooming task compared to initial OT evaluation. Pt reports L index finger  feels stronger than before. Pt reports pain has decreased overall and at worst during the day: 2/10 pain, which improves with joint blocking exercises.  Goal status: 03/21/23 - MET  ASSESSMENT:  CLINICAL IMPRESSION: OT assessed pt's progress towards goals today. Pt met 100% of LTG and STG. Pt made excellent progress towards goals. Pt reported decreased pain and demo'd improved strength of LUE. Pt reports inconsistent compliance with theraputty HEP though consistently participates in gentle AROM digit and wrist HEP to decrease LUE pain throughout the day. Pt demo'd and verbalized understanding of many joint protection strategies today. Pt to come for one more OT visit to review joint protection strategies, ensure understanding of HEP, progress wrist strengthening HEP, and to answer any remaining questions.   PERFORMANCE DEFICITS: in functional skills including ADLs, IADLs, strength, pain, Fine motor control, Gross motor control, body mechanics, endurance, and UE functional use, cognitive skills including  none , and psychosocial skills including environmental adaptation.   IMPAIRMENTS: are limiting patient from ADLs, IADLs, and work.   COMORBIDITIES: may have co-morbidities  that affects occupational performance. Patient will benefit from skilled OT to address above impairments and improve overall function.  REHAB  POTENTIAL: Excellent  PLAN:  OT FREQUENCY: 1x/week  OT DURATION: 6 weeks (dates extended to allow for scheduling)  PLANNED INTERVENTIONS: 97168 OT Re-evaluation, 97535 self care/ADL training, 09811 therapeutic exercise, 97530 therapeutic activity, 97112 neuromuscular re-education, 97140 manual therapy, 97035 ultrasound, 97018 paraffin, 91478 fluidotherapy, 97010 moist heat, 97010 cryotherapy, 97034 contrast bath, 97032 electrical stimulation (manual), 97014 electrical stimulation unattended, 97760 Orthotics management and training, 29562 Splinting (initial encounter), [id number] Subsequent splinting/medication, manual lymph drainage, scar mobilization, passive range of motion, functional mobility training, compression bandaging, energy conservation, patient/family education, and DME and/or AE instructions  RECOMMENDED OTHER SERVICES: N/A  CONSULTED AND AGREED WITH PLAN OF CARE: Patient  PLAN FOR NEXT SESSION:   Progress wrist HEP (add exercises) - per pt request Review Joint protection Answer any remaining pt questions D/C from OT next visit - review any information, assess progress with using rubber band for aerosol deodorant    Wynetta Emery, OT 03/21/2023, 11:24 AM

## 2023-03-21 NOTE — Patient Instructions (Addendum)
     Jar opener options - Examples shown to patient from [employer] to decrease LUE pain, improve joint protection, alternative to Dycem if pt prefers. Pt verbalized understanding.

## 2023-03-21 NOTE — Therapy (Signed)
OUTPATIENT PHYSICAL THERAPY LOWER EXTREMITY TREATMENT   Patient Name: Shelby Campbell MRN: [id number] DOB:06-24-1975, 47 y.o., female Today's Date: 03/21/2023  END OF SESSION:  PT End of Session - 03/21/23 0935     Visit Number 4    Number of Visits 7    Date for PT Re-Evaluation 04/21/23    Authorization Type BCBS    PT Start Time 0936   Pt in restroom   PT Stop Time 1016    PT Time Calculation (min) 40 min    Activity Tolerance Patient tolerated treatment well    Behavior During Therapy [facility] for tasks assessed/performed                Past Medical History:  Diagnosis Date   Asthma    Chronic kidney disease    stage 3 a per lov dr Lawson Fiscal foster 10-01-2019 on chart   Hiatal hernia    History of mitral valve prolapse    no problems since birth of son 13 yrs ago (2008)   Hydronephrosis 2021   Per patient   Hypertension    Porphyria (HCC)    Porphyria (HCC)    Shortness of breath    related to exertion   Past Surgical History:  Procedure Laterality Date   AXILLARY LYMPH NODE BIOPSY  2010   right    CHOLECYSTECTOMY     CYSTOSCOPY WITH RETROGRADE PYELOGRAM, URETEROSCOPY AND STENT PLACEMENT Right 10/15/2019   Procedure: CYSTOSCOPY WITH RETROGRADE PYELOGRAM, URETEROSCOPY AND STENT PLACEMENT, attempted laser of stricture;  Surgeon: Noel Christmas, MD;  Location: [facility] [place];  Service: Urology;  Laterality: Right;  1 HR   DILITATION & CURRETTAGE/HYSTROSCOPY WITH NOVASURE ABLATION N/A 05/04/2016   Procedure: DILATATION & CURETTAGE/HYSTEROSCOPY WITH NOVASURE ABLATION;  Surgeon: Maxie Better, MD;  Location: [facility];  Service: Gynecology;  Laterality: N/A;   LAPAROSCOPIC CHOLECYSTECTOMY SINGLE PORT N/A 09/10/2013   Procedure: LAPAROSCOPIC CHOLECYSTECTOMY SINGLE SITE WITH CHOLANGIOGRAM;  Surgeon: Ardeth Sportsman, MD;  Location: [facility];  Service: General;  Laterality: N/A;   TUBAL LIGATION     Patient Active Problem List   Diagnosis Date Noted   BMI  27.0-27.9,adult 10/18/2021   Body mass index (BMI) of 25.0 to 25.9 in adult 10/12/2020   Subconjunctival hemorrhage of right eye 10/12/2020   Chronic renal disease, stage III (HCC) 04/11/2018   Asthma 03/01/2018   Benign hypertension with chronic kidney disease 02/26/2018   Chronic gastritis, mild.  H Pylori negative 07/31/2013   Chronic cholecystitis with calculus 07/31/2013    PCP: Dorothyann Peng, MD  REFERRING PROVIDER: Pollyann Savoy, MD  REFERRING DIAG: M25.561,M25.562,G89.29 (ICD-10-CM) - Chronic pain of both knees  THERAPY DIAG:  Muscle weakness (generalized)  Chronic pain of both knees  Other abnormalities of gait and mobility  Rationale for Evaluation and Treatment: Rehabilitation  ONSET DATE: 02/22/2023 (referral)   SUBJECTIVE:   SUBJECTIVE STATEMENT: Pt reports having more knee pain today, did a lot of walking this weekend. No falls.   PERTINENT HISTORY: osteoarthritis and positive ANA PAIN:  Are you having pain? Yes: NPRS scale: 5/10 Pain location: Left knee  Pain description: achy  PRECAUTIONS: Fall  RED FLAGS: None   WEIGHT BEARING RESTRICTIONS: No  FALLS:  Has patient fallen in last 6 months? Yes. Number of falls 1 (fell while sparring)  LIVING ENVIRONMENT: Lives with: lives with their family and lives with their spouse Lives in: House/apartment Stairs: Yes: Internal: 16 steps; on right going up and External: 2 in  garage, 1 in front steps; none Has following equipment at home: Single point cane, Walker - 2 wheeled, and Grab bars  OCCUPATION: Engineer   PLOF: Independent  PATIENT GOALS: "to relieve the constant pain"    OBJECTIVE:  Note: Objective measures were completed at Evaluation unless otherwise noted.  DIAGNOSTIC FINDINGS: X-ray of L knee from 10/24   No medial or lateral compartment narrowing was noted.  No patellofemoral narrowing was noted.   Impression: Unremarkable x-rays of the knee.   PATIENT SURVEYS:  LEFS 58/80  (mild to moderate functional limitation)  COGNITION: Overall cognitive status: Within functional limits for tasks assessed     SENSATION: WFL  POSTURE: No Significant postural limitations  PALPATION: No TTP along patella, patellar tendon or joint line   LOWER EXTREMITY MMT: Tested in seated position  MMT Right eval Left eval  Hip flexion 5 5  Hip extension    Hip abduction 5 5  Hip adduction 5 5  Hip internal rotation    Hip external rotation    Knee flexion 5 5  Knee extension 5 4-  Ankle dorsiflexion 5 5  Ankle plantarflexion    Ankle inversion    Ankle eversion     (Blank rows = not tested)  LOWER EXTREMITY SPECIAL TESTS:  Knee special tests: Patella tap test (ballotable patella): negative  FUNCTIONAL TESTS:  SLS: >10s bilaterally, decreased stability on LLE w/compensated trendelenburg    TODAY'S TREATMENT:     Ther Ex  Resisted LAQ w/back resistance band w/45s hold and 1.5 min of rest, 4 rounds per side, for improved quad strength.  Alt fwd eccentric heel taps from 6" step, x3 reps per side. Pt demonstrating significant genu valgus throughout, so added red resistance band around distal quads to provide tactile cue for hip abduction, x10 reps per side. Noted mild genu recurvatum at top of rep bilaterally, so min cues to control knee extension at top of rep. Tactile cues applied throughout to facilitate proper knee positioning.  Side lunge holding 15# KB, x14 per side, for improved quad and hip strength. Pt required mod multimodal cues for proper technique and continued to demonstrate genu valgus position of bilateral knees.    GAIT: Distance walked: Various clinic distances  Assistive device utilized: None Level of assistance: Complete Independence   PATIENT EDUCATION:  Education details: Continue HEP Person educated: Patient Education method: Explanation Education comprehension: verbalized understanding  HOME EXERCISE PROGRAM: Access Code:  [email or web address] URL: [email or web address] Date: 03/07/2023 Prepared by: Alethia Berthold Madysen Faircloth  Exercises - Active Straight Leg Raise with Quad Set  - 1 x daily - 7 x weekly - 3 sets - 10 reps - Single Leg Bridge  - 1 x daily - 7 x weekly - 3 sets - 10 reps - Marching with Resistance  - 1 x daily - 7 x weekly - 3 sets - 10 reps - Backward Monster Walks  - 1 x daily - 7 x weekly - 3 sets - 10 reps - Forward Monster Walks  - 1 x daily - 7 x weekly - 3 sets - 10 reps - Side Stepping with Resistance at Feet  - 1 x daily - 7 x weekly - 3 sets - 10 reps  ASSESSMENT:  CLINICAL IMPRESSION: Emphasis of skilled PT session on isometric/eccentric quad strength and functional hip stability. Pt continues to be limited by significant quad and hip abductor weakness, resulting in genu valgus position w/single leg activities and mild hyperextension of bilateral knees. Pt also tends to  ER her RLE w/activities, resulting in varus strain on R knee. Continue POC.     OBJECTIVE IMPAIRMENTS: decreased activity tolerance, decreased endurance, decreased mobility, difficulty walking, decreased strength, and pain.   ACTIVITY LIMITATIONS: bending, sitting, standing, squatting, stairs, and locomotion level  PARTICIPATION LIMITATIONS: shopping, community activity, occupation, and yard work  PERSONAL FACTORS: Fitness are also affecting patient's functional outcome.   REHAB POTENTIAL: Good  CLINICAL DECISION MAKING: Stable/uncomplicated  EVALUATION COMPLEXITY: Low   GOALS: Goals reviewed with patient? Yes  SHORT TERM GOALS: Target date: 03/24/2023  Pt will be independent with initial HEP for improved strength and pain management   Baseline: Goal status: INITIAL  LONG TERM GOALS: Target date: 04/07/2023   Pt will return to [place] for improved strength and return to PLOF  Baseline: Pt has not returned since May  Goal status: INITIAL  2.  Pt will improve score on LEFS to >/= 68/80 for reduced pain  levels and improved functional mobility Baseline: 58/80 Goal status: INITIAL   PLAN:  PT FREQUENCY: 1x/week  PT DURATION: 6 weeks (POC written for 8 weeks due to delay in scheduling)   PLANNED INTERVENTIONS: 78469- PT Re-evaluation, 97110-Therapeutic exercises, 97530- Therapeutic activity, [id number]- Neuromuscular re-education, 97535- Self Care, 62952- Manual therapy, [id number]- Gait training, [id number]- Aquatic Therapy, [id number]- Electrical stimulation (manual), Balance training, Stair training, Taping, and Dry Needling  PLAN FOR NEXT SESSION: Goals, Add to HEP for L quad and hip abduction strength. Single leg stability, jumping, boxing tasks, core stability, hamstrings    Lashanda Storlie E Alycia Cooperwood, PT, DPT 03/21/2023, 10:23 AM

## 2023-03-28 ENCOUNTER — Ambulatory Visit: Payer: BC Managed Care – PPO | Admitting: Occupational Therapy

## 2023-03-28 ENCOUNTER — Ambulatory Visit: Payer: BC Managed Care – PPO | Admitting: Physical Therapy

## 2023-03-28 DIAGNOSIS — G8929 Other chronic pain: Secondary | ICD-10-CM

## 2023-03-28 DIAGNOSIS — M6281 Muscle weakness (generalized): Secondary | ICD-10-CM

## 2023-03-28 DIAGNOSIS — R2689 Other abnormalities of gait and mobility: Secondary | ICD-10-CM

## 2023-03-28 DIAGNOSIS — R29898 Other symptoms and signs involving the musculoskeletal system: Secondary | ICD-10-CM

## 2023-03-28 DIAGNOSIS — R208 Other disturbances of skin sensation: Secondary | ICD-10-CM

## 2023-03-28 NOTE — Therapy (Addendum)
OUTPATIENT OCCUPATIONAL THERAPY TREATMENT / DISCHARGE  Patient Name: Shelby Campbell MRN: [id number] DOB:19-Jun-1975, 47 y.o., female Today's Date: 03/28/2023  PCP: Dorothyann Peng, MD  REFERRING PROVIDER: Pollyann Savoy, MD  OCCUPATIONAL THERAPY DISCHARGE SUMMARY  Visits from Start of Care: 6  Current functional level related to goals / functional outcomes: Pt met 100% of STG and LTG.   Remaining deficits: Chronic mild pain of L index finger when isolating motion of DIP and more recent mild pain of L palm.   Education / Equipment: Body mechanics/ergonomics, joint protection, LUE HEP, adaptive strategies and A/E including Dycem.   Patient agrees to discharge. Patient goals were met. Patient is being discharged due to meeting the stated rehab goals and pt being pleased with current functional level.     END OF SESSION:  OT End of Session - 03/28/23 1430     Visit Number 6    Number of Visits 6    Date for OT Re-Evaluation 04/21/23    Authorization Type BCBS/[employer] - covered at 100% LL    OT Start Time 0934    OT Stop Time 1013    OT Time Calculation (min) 39 min    Activity Tolerance Patient tolerated treatment well    Behavior During Therapy [facility] for tasks assessed/performed              Past Medical History:  Diagnosis Date   Asthma    Chronic kidney disease    stage 3 a per lov dr Lawson Fiscal foster 10-01-2019 on chart   Hiatal hernia    History of mitral valve prolapse    no problems since birth of son 13 yrs ago (2008)   Hydronephrosis 2021   Per patient   Hypertension    Porphyria (HCC)    Porphyria (HCC)    Shortness of breath    related to exertion   Past Surgical History:  Procedure Laterality Date   AXILLARY LYMPH NODE BIOPSY  2010   right    CHOLECYSTECTOMY     CYSTOSCOPY WITH RETROGRADE PYELOGRAM, URETEROSCOPY AND STENT PLACEMENT Right 10/15/2019   Procedure: CYSTOSCOPY WITH RETROGRADE PYELOGRAM, URETEROSCOPY AND STENT PLACEMENT, attempted laser  of stricture;  Surgeon: Noel Christmas, MD;  Location: [facility];  Service: Urology;  Laterality: Right;  1 HR   DILITATION & CURRETTAGE/HYSTROSCOPY WITH NOVASURE ABLATION N/A 05/04/2016   Procedure: DILATATION & CURETTAGE/HYSTEROSCOPY WITH NOVASURE ABLATION;  Surgeon: Maxie Better, MD;  Location: [facility];  Service: Gynecology;  Laterality: N/A;   LAPAROSCOPIC CHOLECYSTECTOMY SINGLE PORT N/A 09/10/2013   Procedure: LAPAROSCOPIC CHOLECYSTECTOMY SINGLE SITE WITH CHOLANGIOGRAM;  Surgeon: Ardeth Sportsman, MD;  Location: [facility];  Service: General;  Laterality: N/A;   TUBAL LIGATION     Patient Active Problem List   Diagnosis Date Noted   BMI 27.0-27.9,adult 10/18/2021   Body mass index (BMI) of 25.0 to 25.9 in adult 10/12/2020   Subconjunctival hemorrhage of right eye 10/12/2020   Chronic renal disease, stage III (HCC) 04/11/2018   Asthma 03/01/2018   Benign hypertension with chronic kidney disease 02/26/2018   Chronic gastritis, mild.  H Pylori negative 07/31/2013   Chronic cholecystitis with calculus 07/31/2013    ONSET DATE: 02/22/23 (referral date), March 2024 - date of initial injury to left index finger  REFERRING DIAG: S69.92XS (ICD-10-CM) - Injury of left index finger, sequela   THERAPY DIAG:  Muscle weakness (generalized)  Other disturbances of skin sensation  Other symptoms and signs involving the musculoskeletal system  Rationale for Evaluation and Treatment: Rehabilitation  SUBJECTIVE:   SUBJECTIVE STATEMENT: Pt reports new pain at L palm, just proximal to MCP of index finger. Pt reported she is unsure what initially caused pain and reported that the pain has been there for a few days. Pt reports she does not think the pain is d/t exercises and instead thinks "I may have slept on it wrong." Pt reports incorporating A/E and adaptive joint protection strategies throughout daily activities. Pt reports not yet attempting to use rubber band on aerosol  deodorant.  Pt accompanied by: self  PERTINENT HISTORY: osteoarthritis, chronic renal disease stage III, asthma, [place], chronic gastritis, hiatal hernia  Per 02/22/23 MD Progress Notes: "Shelby Campbell is a 47 y.o. female with osteoarthritis and positive ANA.  She states she slammed her left index finger in March and the door.  She states that gradually the pain went away but she still has difficulty bending her finger and she has noticed decreased strength in her finger.  She has been also experiencing pain and discomfort in her left knee.  She had evaluation by the orthopedic surgeon and was told that she has osteoarthritis."  PRECAUTIONS: None  RED FLAGS: Osteoarthritis  WEIGHT BEARING RESTRICTIONS: No  PAIN:  Are you having pain? Yes: NPRS scale: 2/10 Pain location:  L palm, just proximal  to MCP of index finger. Pain description: "twinge" Aggravating factors: touching site of pain Relieving factors: rest  FALLS: Has patient fallen in last 6 months? 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  HAND DOMINANCE: Right  ADLs: Overall ADLs:  Transfers/ambulation related to ADLs: Eating: ind Grooming: ind,  difficulty with isolated L digit motion when pushing down button of aerosol deodorant UB Dressing: no difficulty, including buttons/zippers LB Dressing: no difficulty with pants, shoes, tying  shoelaces Toileting: no difficulty Bathing: no difficulty Tub Shower transfers: ind Equipment: none  FUNCTIONAL OUTCOME MEASURES: Quick Dash: 15.9 points    UPPER EXTREMITY ROM:     WFL for BUE  HAND FUNCTION: Grip strength: Right: 45.1 lbs; Left: 30.6 lbs and Tip pinch: Right 9 lbs, Left: 9 lbs  COORDINATION: 9 Hole Peg test: Right: 19 sec; Left: 20 sec  SENSATION: Pt reports mild pins and needles sensation with "twinge" in distal tip of L index finger , which "comes and goes."   EDEMA: None noted during eval, pt reported some swelling near end of day sometimes.  COGNITION: Overall cognitive status: Within functional limits for tasks assessed  OBSERVATIONS: Pt ambulated ind and appeared well-kept. Pt agreeable. Pt's L index finger skin appeared intact, no redness or swelling noted. OT noted pt's L index fingernail slightly distorted shape and white line at center of nail. Pt reported half of L index fingernail fell off after initial injury when slamming finger in car door. Pt reports fingernail has regrown since then though the nail seems weaker.  TODAY'S TREATMENT:  Therapeutic Activities: OT educated pt on sleep positioning handout (see pt instructions) - to decrease pain, to improve attention to joint protection. Pt verbalized understanding.  OT palpated pt's site of reported pain (just proximal to L index finger MCP joint) and noted flexor tendon gliding through site of area of pain. OT educated pt on UE anatomy and educated pt on possibility that pain may be d/t overuse or irritation after repetitive grasp or poor sleep positioning though unable to fully determine without imaging. OT educated pt on osteoarthritis dx. Pt verbalized understanding of all. OT educated pt on joint protection, avoiding tight grasp to decrease pain, adjusting reps/sets of HEP  until pain at L palm decreases, additional wrist HEP, RICE method to decrease pain, and ice modality (handwritten notes, see pt instructions). Pt verbalized understanding of all. Pt reiterated new L hand pain likely d/t sleeping incorrectly. OT recommended pt to f/u with doctor if pain continues long-term. Pt verbalized understanding.  Pt reported 1 out of 10 pain at L index finger DIP joint on average over the past week. OT and pt discussed D/C. Pt agreed with D/C d/t meeting the stated rehab goals and pt being pleased with current functional level. OT recommended pt continue with HEP PRN. Pt verbalized understanding of all. Pt confirmed no additional questions or concerns.  TherEx Flex bar - supination, pronation, twist both directions - to increase LUE strengthening. Pt denied pain with all tasks.   Wrist flex/ext with 2 lb weight - 10 reps, 2 sets. Then 5 reps, 1 set - to increase LUE strengthening. Pt denied pain with exercise. OT educated pt to decrease reps PRN and build reps over time d/t increased weight. Pt verbalized understanding.   PATIENT EDUCATION: Education details: see today's treatment above, joint protection and body mechanics/ergonomics, review of adaptive strategies and A/E Person educated: Patient Education method: Explanation, Demonstration, and Handouts Education comprehension: verbalized understanding and returned demonstration  HOME EXERCISE PROGRAM: 02/24/23 - tendon glides - handout 02/28/23 - joint protection - handout (see pt instructions), theraputty (red) - handout - Access Code: [email or web address] 03/07/23 - heat/cold modalities handout, finger and wrist AROM - Access Code: [email or web address] 03/21/23 - additional copies of theraputty and digit/wrist AROM HEP from 02/28/23 and 03/07/23  GOALS: Goals reviewed with patient? Yes  SHORT TERM GOALS: Target date: 03/24/23  Patient will ind demonstrate understanding of updated LUE HEP. Baseline: new to outpt OT 03/21/23 - Pt ind  demo'd understanding of theraputty, joint blocking, finger and wrist AROM HEP using only visual handouts. OT provided pt with additional copies of HEP handouts. Goal status: 03/21/23 - MET  2.  Patient will demonstrate at least 5 lbs increase of LUE grip strength as needed to open jars and other containers.  Baseline: Left: 30.6 lbs 03/21/23 - Left: 35.7 lbs - Pt reported no increase in pain during grip strength testing. Goal status: 03/21/23 - MET  3.  Pt will independently recall at least 3 total joint protection, ergonomic, and body mechanic principles.   Baseline: new to outpt OT 03/21/23 - Pt ind verbalized at least 3 joint protection strategies and ergonomic principles: (Adjusting computer setup, using Dycem to open jars/bottles, using larger joints instead of fingertips to complete daily tasks). Goal status: 03/21/23 - MET   LONG TERM GOALS: Target date: 04/21/23  Pt will report ind opening jars with no difficulty using A/E and joint protection strategies PRN. Baseline: QuickDASH - pt reported "mild difficulty" with opening jars. 03/21/23 - Pt reported no difficulty when  using Dycem to open jars and water bottles. Goal status: 03/21/23 - MET  2.  Pt will report no more than 1/10 pain when using aerosol deodorant during daily grooming tasks. Baseline: Pt currently reports up to 4/10 pain at worst during the day: "mostly annoying, a slight twinge."  03/21/23 - Pt reports continuing "twinge" when pushing down aerosol deodorant with L index finger though decreased pain to 1/10 pain when completing daily grooming task compared to initial OT evaluation. Pt reports L index finger feels stronger than before. Pt reports pain has decreased overall and at worst during the day: 2/10 pain, which improves with joint blocking exercises.  Goal status: 03/21/23 - MET  ASSESSMENT:  CLINICAL IMPRESSION: Pt made excellent progress towards goals, meeting 100% of LTG and STG. Pt ind demo'ing improved  understanding of joint protection and body mechanics using A/E and adaptive strategies PRN. Pt reporting decreased pain level affecting L index finger. Pt to D/C from OT today d/t pt meeting the stated rehab goals and pt being pleased with current functional level.   PERFORMANCE DEFICITS: in functional skills including ADLs, IADLs, strength, pain, Fine motor control, Gross motor control, body mechanics, endurance, and UE functional use, cognitive skills including  none , and psychosocial skills including environmental adaptation.   IMPAIRMENTS: are limiting patient from ADLs, IADLs, and work.   COMORBIDITIES: may have co-morbidities  that affects occupational performance. Patient will benefit from skilled OT to address above impairments and improve overall function.  REHAB POTENTIAL: Excellent  PLAN:  OT FREQUENCY: 1x/week  OT DURATION: 6 weeks (dates extended to allow for scheduling)  PLANNED INTERVENTIONS: 97168 OT Re-evaluation, 97535 self care/ADL training, 14782 therapeutic exercise, 97530 therapeutic activity, 97112 neuromuscular re-education, 97140 manual therapy, 97035 ultrasound, 97018 paraffin, 95621 fluidotherapy, 97010 moist heat, 97010 cryotherapy, 97034 contrast bath, 97032 electrical stimulation (manual), 97014 electrical stimulation unattended, 97760 Orthotics management and training, 30865 Splinting (initial encounter), [id number] Subsequent splinting/medication, manual lymph drainage, scar mobilization, passive range of motion, functional mobility training, compression bandaging, energy conservation, patient/family education, and DME and/or AE instructions  RECOMMENDED OTHER SERVICES: N/A  CONSULTED AND AGREED WITH PLAN OF CARE: Patient  PLAN FOR NEXT SESSION:   N/A - Pt D/C from OT today    Wynetta Emery, OT 03/28/2023, 2:41 PM

## 2023-03-28 NOTE — Therapy (Signed)
OUTPATIENT PHYSICAL THERAPY LOWER EXTREMITY TREATMENT   Patient Name: IRATZE CURLER MRN: [id number] DOB:Apr 30, 1976, 47 y.o., female Today's Date: 03/28/2023  END OF SESSION:  PT End of Session - 03/28/23 1015     Visit Number 5    Number of Visits 7    Date for PT Re-Evaluation 04/21/23    Authorization Type BCBS    PT Start Time 1015    PT Stop Time 1056    PT Time Calculation (min) 41 min    Activity Tolerance Patient tolerated treatment well    Behavior During Therapy WFL for tasks assessed/performed                Past Medical History:  Diagnosis Date   Asthma    Chronic kidney disease    stage 3 a per lov dr Lawson Fiscal foster 10-01-2019 on chart   Hiatal hernia    History of mitral valve prolapse    no problems since birth of son 13 yrs ago (2008)   Hydronephrosis 2021   Per patient   Hypertension    Porphyria (HCC)    Porphyria (HCC)    Shortness of breath    related to exertion   Past Surgical History:  Procedure Laterality Date   AXILLARY LYMPH NODE BIOPSY  2010   right    CHOLECYSTECTOMY     CYSTOSCOPY WITH RETROGRADE PYELOGRAM, URETEROSCOPY AND STENT PLACEMENT Right 10/15/2019   Procedure: CYSTOSCOPY WITH RETROGRADE PYELOGRAM, URETEROSCOPY AND STENT PLACEMENT, attempted laser of stricture;  Surgeon: Noel Christmas, MD;  Location: [facility];  Service: Urology;  Laterality: Right;  1 HR   DILITATION & CURRETTAGE/HYSTROSCOPY WITH NOVASURE ABLATION N/A 05/04/2016   Procedure: DILATATION & CURETTAGE/HYSTEROSCOPY WITH NOVASURE ABLATION;  Surgeon: Maxie Better, MD;  Location: [facility];  Service: Gynecology;  Laterality: N/A;   LAPAROSCOPIC CHOLECYSTECTOMY SINGLE PORT N/A 09/10/2013   Procedure: LAPAROSCOPIC CHOLECYSTECTOMY SINGLE SITE WITH CHOLANGIOGRAM;  Surgeon: Ardeth Sportsman, MD;  Location: [facility];  Service: General;  Laterality: N/A;   TUBAL LIGATION     Patient Active Problem List   Diagnosis Date Noted   BMI 27.0-27.9,adult  10/18/2021   Body mass index (BMI) of 25.0 to 25.9 in adult 10/12/2020   Subconjunctival hemorrhage of right eye 10/12/2020   Chronic renal disease, stage III (HCC) 04/11/2018   Asthma 03/01/2018   Benign hypertension with chronic kidney disease 02/26/2018   Chronic gastritis, mild.  H Pylori negative 07/31/2013   Chronic cholecystitis with calculus 07/31/2013    PCP: Dorothyann Peng, MD  REFERRING PROVIDER: Pollyann Savoy, MD  REFERRING DIAG: M25.561,M25.562,G89.29 (ICD-10-CM) - Chronic pain of both knees  THERAPY DIAG:  Muscle weakness (generalized)  Chronic pain of both knees  Other abnormalities of gait and mobility  Rationale for Evaluation and Treatment: Rehabilitation  ONSET DATE: 02/22/2023 (referral)   SUBJECTIVE:   SUBJECTIVE STATEMENT: Pt reports having some knee and back pain today. Exercises are going well.   PERTINENT HISTORY: osteoarthritis and positive ANA PAIN:  Are you having pain? Yes: NPRS scale: 4/10 Pain location: Left knee and low back  Pain description: achy  PRECAUTIONS: Fall  RED FLAGS: None   WEIGHT BEARING RESTRICTIONS: No  FALLS:  Has patient fallen in last 6 months? Yes. Number of falls 1 (fell while sparring)  LIVING ENVIRONMENT: Lives with: lives with their family and lives with their spouse Lives in: House/apartment Stairs: Yes: Internal: 16 steps; on right going up and External: 2 in garage, 1 in front  steps; none Has following equipment at home: Single point cane, Walker - 2 wheeled, and Grab bars  OCCUPATION: Engineer   PLOF: Independent  PATIENT GOALS: "to relieve the constant pain"    OBJECTIVE:  Note: Objective measures were completed at Evaluation unless otherwise noted.  DIAGNOSTIC FINDINGS: X-ray of L knee from 10/24   No medial or lateral compartment narrowing was noted.  No patellofemoral narrowing was noted.   Impression: Unremarkable x-rays of the knee.   PATIENT SURVEYS:  LEFS 58/80 (mild to  moderate functional limitation)  COGNITION: Overall cognitive status: Within functional limits for tasks assessed     SENSATION: WFL  POSTURE: No Significant postural limitations  PALPATION: No TTP along patella, patellar tendon or joint line   LOWER EXTREMITY MMT: Tested in seated position  MMT Right eval Left eval  Hip flexion 5 5  Hip extension    Hip abduction 5 5  Hip adduction 5 5  Hip internal rotation    Hip external rotation    Knee flexion 5 5  Knee extension 5 4-  Ankle dorsiflexion 5 5  Ankle plantarflexion    Ankle inversion    Ankle eversion     (Blank rows = not tested)  LOWER EXTREMITY SPECIAL TESTS:  Knee special tests: Patella tap test (ballotable patella): negative  FUNCTIONAL TESTS:  SLS: >10s bilaterally, decreased stability on LLE w/compensated trendelenburg    TODAY'S TREATMENT:     Ther Ex  Elliptical level 1 for 8 minutes (4 minutes fwd and 4 minutes retro) for improved BLE strength and cardiovascular warmup.  At ballet bar, curtsy lunges w/BUE support, x10 per side, for improved functional hip strength.  4 Blaze pods on random reach setting for improved functional hip strength and ankle stability.  Performed on 1.5 minute intervals with 30s rest periods.  Pt requires SBA guarding and BUE support. Round 1 &2:  RLE on green dynadisc and 4 pods placed on L side from 6-12 o'clock setup.  36 hits and 34. Round 3 &4:  same setup on opposite leg.  43 and 60 hits. Single leg stand to sit while holding 6# med ball, x10 per side, for improved eccentric quad strength and hip abductor strength. Increased difficulty performing on LLE > RLE, min cues to avoid valgus position of L knee throughout.    GAIT: Distance walked: Various clinic distances  Assistive device utilized: None Level of assistance: Complete Independence   PATIENT EDUCATION:  Education details: Continue HEP Person educated: Patient Education method: Explanation Education  comprehension: verbalized understanding  HOME EXERCISE PROGRAM: Access Code: [email or web address] URL: [email or web address] Date: 03/07/2023 Prepared by: Alethia Berthold Minerva Bluett  Exercises - Active Straight Leg Raise with Quad Set  - 1 x daily - 7 x weekly - 3 sets - 10 reps - Single Leg Bridge  - 1 x daily - 7 x weekly - 3 sets - 10 reps - Marching with Resistance  - 1 x daily - 7 x weekly - 3 sets - 10 reps - Backward Monster Walks  - 1 x daily - 7 x weekly - 3 sets - 10 reps - Forward Monster Walks  - 1 x daily - 7 x weekly - 3 sets - 10 reps - Side Stepping with Resistance at Feet  - 1 x daily - 7 x weekly - 3 sets - 10 reps  ASSESSMENT:  CLINICAL IMPRESSION: Emphasis of skilled PT session on STG assessment, improved functional hip strength, eccentric quad control and single leg stability. Pt  continues to demonstrate genu valgus position of LLE, requiring min cues for facilitation of hip abductors to reduce strain on L knee. Pt is performing HEP independently, meeting her STG. Continue POC.     OBJECTIVE IMPAIRMENTS: decreased activity tolerance, decreased endurance, decreased mobility, difficulty walking, decreased strength, and pain.   ACTIVITY LIMITATIONS: bending, sitting, standing, squatting, stairs, and locomotion level  PARTICIPATION LIMITATIONS: shopping, community activity, occupation, and yard work  PERSONAL FACTORS: Fitness are also affecting patient's functional outcome.   REHAB POTENTIAL: Good  CLINICAL DECISION MAKING: Stable/uncomplicated  EVALUATION COMPLEXITY: Low   GOALS: Goals reviewed with patient? Yes  SHORT TERM GOALS: Target date: 03/24/2023  Pt will be independent with initial HEP for improved strength and pain management   Baseline: Goal status: MET  LONG TERM GOALS: Target date: 04/07/2023   Pt will return to [place] for improved strength and return to PLOF  Baseline: Pt has not returned since May  Goal status: INITIAL  2.  Pt will  improve score on LEFS to >/= 68/80 for reduced pain levels and improved functional mobility Baseline: 58/80 Goal status: INITIAL   PLAN:  PT FREQUENCY: 1x/week  PT DURATION: 6 weeks (POC written for 8 weeks due to delay in scheduling)   PLANNED INTERVENTIONS: 78295- PT Re-evaluation, 97110-Therapeutic exercises, 97530- Therapeutic activity, 97112- Neuromuscular re-education, 97535- Self Care, 62130- Manual therapy, [id number]- Gait training, [id number]- Aquatic Therapy, [id number]- Electrical stimulation (manual), Balance training, Stair training, Taping, and Dry Needling  PLAN FOR NEXT SESSION: Update goal date, Add to HEP for L quad and hip abduction strength. Single leg stability, jumping, boxing tasks, core stability, hamstrings    Emilia Kayes E Jacqulyne Gladue, PT, DPT 03/28/2023, 10:57 AM

## 2023-03-28 NOTE — Patient Instructions (Addendum)
   Ice - 10 to 20 minutes, 4-5x per day. Avoid tight, repetitive grasp (such as steering wheel when driving, loosen grip and drive with 2 hands) Decrease reps/sets PRN of HEP until pain of L palm decreases. May increase weight to 2 lb. For wrist flex/ext as tolerated Option: Flex bar

## 2023-04-11 ENCOUNTER — Ambulatory Visit: Payer: BC Managed Care – PPO | Attending: Rheumatology | Admitting: Physical Therapy

## 2023-04-11 ENCOUNTER — Ambulatory Visit: Payer: BC Managed Care – PPO | Admitting: Occupational Therapy

## 2023-04-11 DIAGNOSIS — M6281 Muscle weakness (generalized): Secondary | ICD-10-CM | POA: Diagnosis present

## 2023-04-11 DIAGNOSIS — R2689 Other abnormalities of gait and mobility: Secondary | ICD-10-CM | POA: Diagnosis present

## 2023-04-11 DIAGNOSIS — R208 Other disturbances of skin sensation: Secondary | ICD-10-CM | POA: Insufficient documentation

## 2023-04-11 DIAGNOSIS — M25562 Pain in left knee: Secondary | ICD-10-CM | POA: Insufficient documentation

## 2023-04-11 DIAGNOSIS — R29898 Other symptoms and signs involving the musculoskeletal system: Secondary | ICD-10-CM | POA: Diagnosis present

## 2023-04-11 DIAGNOSIS — M25561 Pain in right knee: Secondary | ICD-10-CM | POA: Diagnosis present

## 2023-04-11 DIAGNOSIS — G8929 Other chronic pain: Secondary | ICD-10-CM | POA: Insufficient documentation

## 2023-04-11 NOTE — Therapy (Signed)
OUTPATIENT PHYSICAL THERAPY LOWER EXTREMITY TREATMENT   Patient Name: Shelby Campbell MRN: [id number] DOB:03/05/1976, 47 y.o., female Today's Date: 04/11/2023  END OF SESSION:  PT End of Session - 04/11/23 0937     Visit Number 6    Number of Visits 7    Date for PT Re-Evaluation 04/21/23    Authorization Type BCBS    PT Start Time [phone]   Pt arrived late   PT Stop Time 1015    PT Time Calculation (min) 41 min    Activity Tolerance Patient tolerated treatment well    Behavior During Therapy [facility] for tasks assessed/performed                 Past Medical History:  Diagnosis Date   Asthma    Chronic kidney disease    stage 3 a per lov dr Lawson Fiscal foster 10-01-2019 on chart   Hiatal hernia    History of mitral valve prolapse    no problems since birth of son 13 yrs ago (2008)   Hydronephrosis 2021   Per patient   Hypertension    Porphyria (HCC)    Porphyria (HCC)    Shortness of breath    related to exertion   Past Surgical History:  Procedure Laterality Date   AXILLARY LYMPH NODE BIOPSY  2010   right    CHOLECYSTECTOMY     CYSTOSCOPY WITH RETROGRADE PYELOGRAM, URETEROSCOPY AND STENT PLACEMENT Right 10/15/2019   Procedure: CYSTOSCOPY WITH RETROGRADE PYELOGRAM, URETEROSCOPY AND STENT PLACEMENT, attempted laser of stricture;  Surgeon: Noel Christmas, MD;  Location: [facility];  Service: Urology;  Laterality: Right;  1 HR   DILITATION & CURRETTAGE/HYSTROSCOPY WITH NOVASURE ABLATION N/A 05/04/2016   Procedure: DILATATION & CURETTAGE/HYSTEROSCOPY WITH NOVASURE ABLATION;  Surgeon: Maxie Better, MD;  Location: [facility];  Service: Gynecology;  Laterality: N/A;   LAPAROSCOPIC CHOLECYSTECTOMY SINGLE PORT N/A 09/10/2013   Procedure: LAPAROSCOPIC CHOLECYSTECTOMY SINGLE SITE WITH CHOLANGIOGRAM;  Surgeon: Ardeth Sportsman, MD;  Location: [facility];  Service: General;  Laterality: N/A;   TUBAL LIGATION     Patient Active Problem List   Diagnosis Date Noted   BMI  27.0-27.9,adult 10/18/2021   Body mass index (BMI) of 25.0 to 25.9 in adult 10/12/2020   Subconjunctival hemorrhage of right eye 10/12/2020   Chronic renal disease, stage III (HCC) 04/11/2018   Asthma 03/01/2018   Benign hypertension with chronic kidney disease 02/26/2018   Chronic gastritis, mild.  H Pylori negative 07/31/2013   Chronic cholecystitis with calculus 07/31/2013    PCP: Dorothyann Peng, MD  REFERRING PROVIDER: Pollyann Savoy, MD  REFERRING DIAG: M25.561,M25.562,G89.29 (ICD-10-CM) - Chronic pain of both knees  THERAPY DIAG:  Muscle weakness (generalized)  Chronic pain of both knees  Other abnormalities of gait and mobility  Rationale for Evaluation and Treatment: Rehabilitation  ONSET DATE: 02/22/2023 (referral)   SUBJECTIVE:   SUBJECTIVE STATEMENT: Pt reports her left knee is hurting today, likely form sitting in the car for the holiday. Denies acute changes   PERTINENT HISTORY: osteoarthritis and positive ANA PAIN:  Are you having pain? Yes: NPRS scale: 4/10 Pain location: Left knee and low back  Pain description: achy  PRECAUTIONS: Fall  RED FLAGS: None   WEIGHT BEARING RESTRICTIONS: No  FALLS:  Has patient fallen in last 6 months? Yes. Number of falls 1 (fell while sparring)  LIVING ENVIRONMENT: Lives with: lives with their family and lives with their spouse Lives in: House/apartment Stairs: Yes: Internal: 16 steps;  on right going up and External: 2 in garage, 1 in front steps; none Has following equipment at home: Single point cane, Walker - 2 wheeled, and Grab bars  OCCUPATION: Engineer   PLOF: Independent  PATIENT GOALS: "to relieve the constant pain"    OBJECTIVE:  Note: Objective measures were completed at Evaluation unless otherwise noted.  DIAGNOSTIC FINDINGS: X-ray of L knee from 10/24   No medial or lateral compartment narrowing was noted.  No patellofemoral narrowing was noted.   Impression: Unremarkable x-rays of the  knee.   PATIENT SURVEYS:  LEFS 58/80 (mild to moderate functional limitation)  COGNITION: Overall cognitive status: Within functional limits for tasks assessed     SENSATION: WFL  POSTURE: No Significant postural limitations  PALPATION: No TTP along patella, patellar tendon or joint line   LOWER EXTREMITY MMT: Tested in seated position  MMT Right eval Left eval  Hip flexion 5 5  Hip extension    Hip abduction 5 5  Hip adduction 5 5  Hip internal rotation    Hip external rotation    Knee flexion 5 5  Knee extension 5 4-  Ankle dorsiflexion 5 5  Ankle plantarflexion    Ankle inversion    Ankle eversion     (Blank rows = not tested)  LOWER EXTREMITY SPECIAL TESTS:  Knee special tests: Patella tap test (ballotable patella): negative  FUNCTIONAL TESTS:  SLS: >10s bilaterally, decreased stability on LLE w/compensated trendelenburg    TODAY'S TREATMENT:     Ther Ex  SciFit multi-peaks level 10 for 8 minutes using BLEs only for dynamic cardiovascular warmup.  At ballet bar, hip airplanes for improved hip mobility and single leg stability, x10 reps per side. Min cues to facilitate glutes to perform hip extension rather than compensate w/spinal rotation.  Supine iron crosses, x6 per side, for improved spinal mobility. Pt w/limited ROM on L side > R side.  Single leg glute bridges w/20# KB, x10 reps per side, for improved posterior chain strength, core stability and hip abduction strength.  Child's pose to cobra flow, x5 reps, for improved functional mobility and pain modulation.    GAIT: Distance walked: Various clinic distances  Assistive device utilized: None Level of assistance: Complete Independence   PATIENT EDUCATION:  Education details: Continue HEP Person educated: Patient Education method: Explanation Education comprehension: verbalized understanding  HOME EXERCISE PROGRAM: Access Code: [email or web address] URL: [email or web address] Date:  03/07/2023 Prepared by: Alethia Berthold Farryn Linares  Exercises - Active Straight Leg Raise with Quad Set  - 1 x daily - 7 x weekly - 3 sets - 10 reps - Single Leg Bridge  - 1 x daily - 7 x weekly - 3 sets - 10 reps - Marching with Resistance  - 1 x daily - 7 x weekly - 3 sets - 10 reps - Backward Monster Walks  - 1 x daily - 7 x weekly - 3 sets - 10 reps - Forward Monster Walks  - 1 x daily - 7 x weekly - 3 sets - 10 reps - Side Stepping with Resistance at Feet  - 1 x daily - 7 x weekly - 3 sets - 10 reps  ASSESSMENT:  CLINICAL IMPRESSION: Emphasis of skilled PT session on improved functional mobility, posterior chain strength and pain modulation. Pt reports increase in pain and tightness this date following long car ride over the weekend. Pt continues to be limited by decreased functional hip strength and quad weakness on L side, but has improved since eval.  Pt in agreement to recert next visit to continue working on BLE strength and return to [place]. Continue POC.     OBJECTIVE IMPAIRMENTS: decreased activity tolerance, decreased endurance, decreased mobility, difficulty walking, decreased strength, and pain.   ACTIVITY LIMITATIONS: bending, sitting, standing, squatting, stairs, and locomotion level  PARTICIPATION LIMITATIONS: shopping, community activity, occupation, and yard work  PERSONAL FACTORS: Fitness are also affecting patient's functional outcome.   REHAB POTENTIAL: Good  CLINICAL DECISION MAKING: Stable/uncomplicated  EVALUATION COMPLEXITY: Low   GOALS: Goals reviewed with patient? Yes  SHORT TERM GOALS: Target date: 03/24/2023  Pt will be independent with initial HEP for improved strength and pain management   Baseline: Goal status: MET  LONG TERM GOALS: Target date: 04/21/2023 (updated to match cert date)   Pt will return to [place] for improved strength and return to PLOF  Baseline: Pt has not returned since May  Goal status: INITIAL  2.  Pt will improve score  on LEFS to >/= 68/80 for reduced pain levels and improved functional mobility Baseline: 58/80 Goal status: INITIAL   PLAN:  PT FREQUENCY: 1x/week  PT DURATION: 6 weeks (POC written for 8 weeks due to delay in scheduling)   PLANNED INTERVENTIONS: 62952- PT Re-evaluation, 97110-Therapeutic exercises, 97530- Therapeutic activity, [id number]- Neuromuscular re-education, 97535- Self Care, [id number]- Manual therapy, [id number]- Gait training, [phone]- Aquatic Therapy, [phone]- Electrical stimulation (manual), Balance training, Stair training, Taping, and Dry Needling  PLAN FOR NEXT SESSION: Goals and recert. Add to HEP for L quad and hip abduction strength. Single leg stability, jumping, boxing tasks, core stability, hamstrings    Solace Wendorff E Ananth Fiallos, PT, DPT 04/11/2023, 10:17 AM

## 2023-04-18 ENCOUNTER — Encounter: Payer: Self-pay | Admitting: Physical Therapy

## 2023-04-18 ENCOUNTER — Ambulatory Visit: Payer: BC Managed Care – PPO | Admitting: Physical Therapy

## 2023-04-18 DIAGNOSIS — R29898 Other symptoms and signs involving the musculoskeletal system: Secondary | ICD-10-CM

## 2023-04-18 DIAGNOSIS — R208 Other disturbances of skin sensation: Secondary | ICD-10-CM

## 2023-04-18 DIAGNOSIS — M6281 Muscle weakness (generalized): Secondary | ICD-10-CM | POA: Diagnosis not present

## 2023-04-18 DIAGNOSIS — R2689 Other abnormalities of gait and mobility: Secondary | ICD-10-CM

## 2023-04-18 DIAGNOSIS — G8929 Other chronic pain: Secondary | ICD-10-CM

## 2023-04-18 NOTE — Therapy (Addendum)
OUTPATIENT PHYSICAL THERAPY LOWER EXTREMITY TREATMENT- RECERTIFICATION   Patient Name: Shelby Campbell MRN: [id number] DOB:[birth year], 47 y.o., female Today's Date: 04/18/2023  END OF SESSION:  PT End of Session - 04/18/23 0933     Visit Number 7    Number of Visits 13   recert   Date for PT Re-Evaluation 06/13/23   recert   Authorization Type BCBS    PT Start Time 0930    PT Stop Time 1016    PT Time Calculation (min) 46 min    Activity Tolerance Patient tolerated treatment well    Behavior During Therapy WFL for tasks assessed/performed                  Past Medical History:  Diagnosis Date   Asthma    Chronic kidney disease    stage 3 a per lov dr Lawson Fiscal foster 10-01-2019 on chart   Hiatal hernia    History of mitral valve prolapse    no problems since birth of son 13 yrs ago (2008)   Hydronephrosis 2021   Per patient   Hypertension    Porphyria (HCC)    Porphyria (HCC)    Shortness of breath    related to exertion   Past Surgical History:  Procedure Laterality Date   AXILLARY LYMPH NODE BIOPSY  2010   right    CHOLECYSTECTOMY     CYSTOSCOPY WITH RETROGRADE PYELOGRAM, URETEROSCOPY AND STENT PLACEMENT Right 10/15/2019   Procedure: CYSTOSCOPY WITH RETROGRADE PYELOGRAM, URETEROSCOPY AND STENT PLACEMENT, attempted laser of stricture;  Surgeon: Noel Christmas, MD;  Location: [facility];  Service: Urology;  Laterality: Right;  1 HR   DILITATION & CURRETTAGE/HYSTROSCOPY WITH NOVASURE ABLATION N/A 05/04/2016   Procedure: DILATATION & CURETTAGE/HYSTEROSCOPY WITH NOVASURE ABLATION;  Surgeon: Maxie Better, MD;  Location: [facility];  Service: Gynecology;  Laterality: N/A;   LAPAROSCOPIC CHOLECYSTECTOMY SINGLE PORT N/A 09/10/2013   Procedure: LAPAROSCOPIC CHOLECYSTECTOMY SINGLE SITE WITH CHOLANGIOGRAM;  Surgeon: Ardeth Sportsman, MD;  Location: [facility];  Service: General;  Laterality: N/A;   TUBAL LIGATION     Patient Active Problem List   Diagnosis Date  Noted   BMI 27.0-27.9,adult 10/18/2021   Body mass index (BMI) of 25.0 to 25.9 in adult 10/12/2020   Subconjunctival hemorrhage of right eye 10/12/2020   Chronic renal disease, stage III (HCC) 04/11/2018   Asthma 03/01/2018   Benign hypertension with chronic kidney disease 02/26/2018   Chronic gastritis, mild.  H Pylori negative 07/31/2013   Chronic cholecystitis with calculus 07/31/2013    PCP: Dorothyann Peng, MD  REFERRING PROVIDER: Pollyann Savoy, MD  REFERRING DIAG: M25.561,M25.562,G89.29 (ICD-10-CM) - Chronic pain of both knees  THERAPY DIAG:  Muscle weakness (generalized)  Chronic pain of both knees  Other abnormalities of gait and mobility  Other symptoms and signs involving the musculoskeletal system  Other disturbances of skin sensation  Rationale for Evaluation and Treatment: Rehabilitation  ONSET DATE: 02/22/2023 (referral)   SUBJECTIVE:   SUBJECTIVE STATEMENT: Patient reported R knee knee 3/10 pain this morning. No pain with L knee this morning. No changes since last visit.   PERTINENT HISTORY: osteoarthritis and positive ANA PAIN:  Are you having pain? Yes: NPRS scale: 3/10 Pain location: Right knee Pain description: ache  PRECAUTIONS: Fall  RED FLAGS: None   WEIGHT BEARING RESTRICTIONS: No  FALLS:  Has patient fallen in last 6 months? Yes. Number of falls 1 (fell while sparring)  LIVING ENVIRONMENT: Lives with: lives with their  family and lives with their spouse Lives in: House/apartment Stairs: Yes: Internal: 16 steps; on right going up and External: 2 in garage, 1 in front steps; none Has following equipment at home: Single point cane, Walker - 2 wheeled, and Grab bars  OCCUPATION: Engineer   PLOF: Independent  PATIENT GOALS: "to relieve the constant pain"    OBJECTIVE:  Note: Objective measures were completed at Evaluation unless otherwise noted.  DIAGNOSTIC FINDINGS: X-ray of L knee from 10/24   No medial or lateral  compartment narrowing was noted.  No patellofemoral narrowing was noted.   Impression: Unremarkable x-rays of the knee.   PATIENT SURVEYS:  LEFS 58/80 (mild to moderate functional limitation)  COGNITION: Overall cognitive status: Within functional limits for tasks assessed     SENSATION: WFL  POSTURE: No Significant postural limitations  PALPATION: No TTP along patella, patellar tendon or joint line   LOWER EXTREMITY MMT: Tested in seated position  MMT Right eval Left eval  Hip flexion 5 5  Hip extension    Hip abduction 5 5  Hip adduction 5 5  Hip internal rotation    Hip external rotation    Knee flexion 5 5  Knee extension 5 4-  Ankle dorsiflexion 5 5  Ankle plantarflexion    Ankle inversion    Ankle eversion     (Blank rows = not tested)  LOWER EXTREMITY SPECIAL TESTS:  Knee special tests: Patella tap test (ballotable patella): negative  FUNCTIONAL TESTS:  SLS: >10s bilaterally, decreased stability on LLE w/compensated trendelenburg    TODAY'S TREATMENT:      - Goal Check  LEFS: 73/80 Pt reports she has not returned to [place] due to time constraints. Discussed potential to perform strength training or pilates at home and pt agreeable to this. Goals updated to reflect this.   - Single Leg Squats   - Verbal cueing for eccentric control   - Quad focus  - Single Leg RDLs   - 12 lb KB   - Hamstring focus   - Single Leg Hip Thrusts   - Verbal cueing to use hips not back   - Posterior chain focus   - Standing Clamshell  - Isometric into ball   - Abduction focus   - Bird Dogs   - Core control   - L UE, R LE more challenging side    PATIENT EDUCATION:  Education details: Continue HEP Person educated: Patient Education method: Explanation Education comprehension: verbalized understanding  HOME EXERCISE PROGRAM: Access Code: [email or web address] URL: [email or web address] Date: 03/07/2023 Prepared by: Alethia Berthold Plaster  Exercises -  Active Straight Leg Raise with Quad Set  - 1 x daily - 7 x weekly - 3 sets - 10 reps - Single Leg Bridge  - 1 x daily - 7 x weekly - 3 sets - 10 reps - Marching with Resistance  - 1 x daily - 7 x weekly - 3 sets - 10 reps - Backward Monster Walks  - 1 x daily - 7 x weekly - 3 sets - 10 reps - Forward Monster Walks  - 1 x daily - 7 x weekly - 3 sets - 10 reps - Side Stepping with Resistance at Feet  - 1 x daily - 7 x weekly - 3 sets - 10 reps  ASSESSMENT:  CLINICAL IMPRESSION: Patient seen for skilled physical therapy session with emphasis on bilat single leg stability and goal check. Patient has not returned to [place] due to scheduling and not  due to pain. No plans to return at this time but plans on walking more or starting a new exercise program that is conducive to her current lifestyle. Patient is doing well with single leg strengthening exercises; however, requires cueing for proper form to engage abductors. Patient scored 73/80 on LEFS which is improved from baseline of 58/80, indicating less difficulty now with activity due to LE pain than before. Would continue to benefit from skilled PT for bilat functional abduction strength and proper form with lifting techniques. Continue POC.     OBJECTIVE IMPAIRMENTS: decreased activity tolerance, decreased endurance, decreased mobility, difficulty walking, decreased strength, and pain.   ACTIVITY LIMITATIONS: bending, sitting, standing, squatting, stairs, and locomotion level  PARTICIPATION LIMITATIONS: shopping, community activity, occupation, and yard work  PERSONAL FACTORS: Fitness are also affecting patient's functional outcome.   REHAB POTENTIAL: Good  CLINICAL DECISION MAKING: Stable/uncomplicated  EVALUATION COMPLEXITY: Low   GOALS: Goals reviewed with patient? Yes  SHORT TERM GOALS: Target date: 03/24/2023  Pt will be independent with initial HEP for improved strength and pain management  Baseline: Goal status: MET  LONG  TERM GOALS: Target date: 04/21/2023 (updated to match cert date)   Pt will return to [place] for improved strength and return to PLOF  Baseline: Pt has not returned since May  04/18/23: Pt has not returned due to scheduling, not due to pain  Goal status: NOT MET   2.  Pt will improve score on LEFS to >/= 68/80 for reduced pain levels and improved functional mobility Baseline: 58/80 04/18/23: 73/80  Goal status: MET   NEW SHORT TERM GOALS FOR UPDATED POC:   Target date: 05/16/2023  Pt will demonstrate and verbalize ability to self-correct genu valgus position with single leg tasks and lifts for reduced knee pain and proper body mechanics  Baseline: min-mod multimodal cues from therapist  Goal status: INITIAL   NEW LONG TERM GOALS FOR UPDATED POC:  Target date: 05/30/2023  Pt will be compliant with at-home exercise program (lifting/pilates/etc.) for improved hip strength and reduced pain levels  Baseline: Pt unable to go to [place] due to kid's schedules Goal status: INITIAL    PLAN:  PT FREQUENCY: 1x/week  PT DURATION: 6 weeks + 6 weeks (recert)  PLANNED INTERVENTIONS: 46962- PT Re-evaluation, 97110-Therapeutic exercises, 97530- Therapeutic activity, [id number]- Neuromuscular re-education, 97535- Self Care, [id number]- Manual therapy, [id number]- Gait training, [id number]- Aquatic Therapy, [id number]- Electrical stimulation (manual), Balance training, Stair training, Taping, and Dry Needling  PLAN FOR NEXT SESSION: Add to HEP for L quad and hip abduction strength. Single leg stability, jumping, boxing tasks, core stability, hamstrings    Galia Rahm M Lessa Huge, SPT 04/18/2023, 10:37 AM

## 2023-04-25 ENCOUNTER — Ambulatory Visit: Payer: BC Managed Care – PPO | Admitting: Physical Therapy

## 2023-04-25 DIAGNOSIS — M6281 Muscle weakness (generalized): Secondary | ICD-10-CM

## 2023-04-25 DIAGNOSIS — G8929 Other chronic pain: Secondary | ICD-10-CM

## 2023-04-25 DIAGNOSIS — R2689 Other abnormalities of gait and mobility: Secondary | ICD-10-CM

## 2023-04-25 NOTE — Therapy (Addendum)
OUTPATIENT PHYSICAL THERAPY LOWER EXTREMITY TREATMENT   Patient Name: RAYCHELL KNAPKE MRN: [id number] DOB:May 17, 1975, 47 y.o., female Today's Date: 04/25/2023  END OF SESSION:  PT End of Session - 04/25/23 0856     Visit Number 8    Number of Visits 13   recert   Date for PT Re-Evaluation 06/13/23   recert   Authorization Type BC[employer]    PT Start Time [phone]   therapist running late   PT Stop Time 0935    PT Time Calculation (min) 40 min    Activity Tolerance Patient tolerated treatment well    Behavior During Therapy [facility] for tasks assessed/performed                  Past Medical History:  Diagnosis Date   Asthma    Chronic kidney disease    stage 3 a per lov dr Lawson Fiscal foster 10-01-2019 on chart   Hiatal hernia    History of mitral valve prolapse    no problems since birth of son 13 yrs ago (2008)   Hydronephrosis 2021   Per patient   Hypertension    Porphyria (HCC)    Porphyria (HCC)    Shortness of breath    related to exertion   Past Surgical History:  Procedure Laterality Date   AXILLARY LYMPH NODE BIOPSY  2010   right    CHOLECYSTECTOMY     CYSTOSCOPY WITH RETROGRADE PYELOGRAM, URETEROSCOPY AND STENT PLACEMENT Right 10/15/2019   Procedure: CYSTOSCOPY WITH RETROGRADE PYELOGRAM, URETEROSCOPY AND STENT PLACEMENT, attempted laser of stricture;  Surgeon: Noel Christmas, MD;  Location: [facility];  Service: Urology;  Laterality: Right;  1 HR   DILITATION & CURRETTAGE/HYSTROSCOPY WITH NOVASURE ABLATION N/A 05/04/2016   Procedure: DILATATION & CURETTAGE/HYSTEROSCOPY WITH NOVASURE ABLATION;  Surgeon: Maxie Better, MD;  Location: [facility];  Service: Gynecology;  Laterality: N/A;   LAPAROSCOPIC CHOLECYSTECTOMY SINGLE PORT N/A 09/10/2013   Procedure: LAPAROSCOPIC CHOLECYSTECTOMY SINGLE SITE WITH CHOLANGIOGRAM;  Surgeon: Ardeth Sportsman, MD;  Location: [facility];  Service: General;  Laterality: N/A;   TUBAL LIGATION     Patient Active Problem List    Diagnosis Date Noted   BMI 27.0-27.9,adult 10/18/2021   Body mass index (BMI) of 25.0 to 25.9 in adult 10/12/2020   Subconjunctival hemorrhage of right eye 10/12/2020   Chronic renal disease, stage III (HCC) 04/11/2018   Asthma 03/01/2018   Benign hypertension with chronic kidney disease 02/26/2018   Chronic gastritis, mild.  H Pylori negative 07/31/2013   Chronic cholecystitis with calculus 07/31/2013    PCP: Dorothyann Peng, MD  REFERRING PROVIDER: Pollyann Savoy, MD  REFERRING DIAG: M25.561,M25.562,G89.29 (ICD-10-CM) - Chronic pain of both knees  THERAPY DIAG:  Muscle weakness (generalized)  Other abnormalities of gait and mobility  Chronic pain of both knees  Rationale for Evaluation and Treatment: Rehabilitation  ONSET DATE: 02/22/2023 (referral)   SUBJECTIVE:   SUBJECTIVE STATEMENT: Patient reports doing well. Denies pain or acute changes.   PERTINENT HISTORY: osteoarthritis and positive ANA PAIN:  Are you having pain? No  PRECAUTIONS: Fall  RED FLAGS: None   WEIGHT BEARING RESTRICTIONS: No  FALLS:  Has patient fallen in last 6 months? Yes. Number of falls 1 (fell while sparring)  LIVING ENVIRONMENT: Lives with: lives with their family and lives with their spouse Lives in: House/apartment Stairs: Yes: Internal: 16 steps; on right going up and External: 2 in garage, 1 in front steps; none Has following equipment at home: Single  point cane, Walker - 2 wheeled, and Grab bars  OCCUPATION: Engineer   PLOF: Independent  PATIENT GOALS: "to relieve the constant pain"    OBJECTIVE:  Note: Objective measures were completed at Evaluation unless otherwise noted.  DIAGNOSTIC FINDINGS: X-ray of L knee from 10/24   No medial or lateral compartment narrowing was noted.  No patellofemoral narrowing was noted.   Impression: Unremarkable x-rays of the knee.   PATIENT SURVEYS:  LEFS 58/80 (mild to moderate functional limitation)  COGNITION: Overall  cognitive status: Within functional limits for tasks assessed     SENSATION: WFL  POSTURE: No Significant postural limitations  PALPATION: No TTP along patella, patellar tendon or joint line   LOWER EXTREMITY MMT: Tested in seated position  MMT Right eval Left eval  Hip flexion 5 5  Hip extension    Hip abduction 5 5  Hip adduction 5 5  Hip internal rotation    Hip external rotation    Knee flexion 5 5  Knee extension 5 4-  Ankle dorsiflexion 5 5  Ankle plantarflexion    Ankle inversion    Ankle eversion     (Blank rows = not tested)  LOWER EXTREMITY SPECIAL TESTS:  Knee special tests: Patella tap test (ballotable patella): negative  FUNCTIONAL TESTS:  SLS: >10s bilaterally, decreased stability on LLE w/compensated trendelenburg    TODAY'S TREATMENT:      Ther Ex  Pt performed floor transfer independently w/no UE support  On red floor mat:  Alt spiderman lunge w/rotation, x8 per side, for improved functional hip and thoracic mobility. Noted decreased mobility of RLE >LLE Low lunge to adductor rock backs, x8 per side, for improved hamstring mobility and hip flexor stretch. Increased difficulty on R side  In quadruped w/single leg extended in modified bird dog position, alt corner taps, x10 per side, for improved posterior chain and core strength. Decreased stability on RLE when tapping w/LLE.  Wide leg child's pose to cobra flow, x10 reps, for improved hip mobility and lumbar extension ROM.  On mat table, prone hamstring curls w/orange resistance band for improved posterior chain strength. Pt performed x10 per side w/single leg and x10 double leg. Increased difficulty on RLE > LLE  Supine iron crosses, x4 per side, for improved lumbar spine mobility as pt reported tightness following hamstring curls.  Goblet squat to curtsy lunge, x8 per side, w/12# KB for improved quad and hip abductor strength. Pt more challenged when lunging to L side but denied pain w/activity.    Single leg presses, x12 reps per side at 90#, for improved quad strength. Pt required min cues to avoid knee hyperextension at top of rep.  Elevated heel squats, x20 reps, for targeted quad strengthening. Pt surprised at how much depth she could perform w/heels elevated and reported feeling movement in quads but no pain.  Side plank w/clamshell, x12 reps per side, for imporved core stability and hip abduction strength. Increased difficulty when performing clamshell w/LLE as pt could not stabilize in side plank on R side.  Long sitting KB march overs using 12# KB, x10 per side, for improved hip flexor strength and core stability. Pt able to maintain upright posture well w/minimal UE support.    PATIENT EDUCATION:  Education details: Continue HEP Person educated: Patient Education method: Explanation Education comprehension: verbalized understanding  HOME EXERCISE PROGRAM: Access Code: [email or web address] URL: [email or web address] Date: 03/07/2023 Prepared by: Alethia Berthold Keighan Amezcua  Exercises - Active Straight Leg Raise with Quad Set  - 1 x  daily - 7 x weekly - 3 sets - 10 reps - Single Leg Bridge  - 1 x daily - 7 x weekly - 3 sets - 10 reps - Marching with Resistance  - 1 x daily - 7 x weekly - 3 sets - 10 reps - Backward Monster Walks  - 1 x daily - 7 x weekly - 3 sets - 10 reps - Forward Monster Walks  - 1 x daily - 7 x weekly - 3 sets - 10 reps - Side Stepping with Resistance at Feet  - 1 x daily - 7 x weekly - 3 sets - 10 reps  ASSESSMENT:  CLINICAL IMPRESSION: Emphasis of skilled PT session on quad strength, functional core stability and improved spinal/hip mobility. Pt tolerated session well w/no report of pain in her knees but was most challenged by curtsy lunges. Pt continues to demonstrate decreased stability of R hip abductors/extensors w/over reliance on LLE, resulting in L knee pain. However, pt has improved her body mechanics w/lifting and continues to progress towards her  goals.  Continue POC.     OBJECTIVE IMPAIRMENTS: decreased activity tolerance, decreased endurance, decreased mobility, difficulty walking, decreased strength, and pain.   ACTIVITY LIMITATIONS: bending, sitting, standing, squatting, stairs, and locomotion level  PARTICIPATION LIMITATIONS: shopping, community activity, occupation, and yard work  PERSONAL FACTORS: Fitness are also affecting patient's functional outcome.   REHAB POTENTIAL: Good  CLINICAL DECISION MAKING: Stable/uncomplicated  EVALUATION COMPLEXITY: Low   GOALS: Goals reviewed with patient? Yes  SHORT TERM GOALS: Target date: 03/24/2023  Pt will be independent with initial HEP for improved strength and pain management  Baseline: Goal status: MET  LONG TERM GOALS: Target date: 04/21/2023 (updated to match cert date)   Pt will return to [place] for improved strength and return to PLOF  Baseline: Pt has not returned since May  04/18/23: Pt has not returned due to scheduling, not due to pain  Goal status: NOT MET   2.  Pt will improve score on LEFS to >/= 68/80 for reduced pain levels and improved functional mobility Baseline: 58/80 04/18/23: 73/80  Goal status: MET   NEW SHORT TERM GOALS FOR UPDATED POC:   Target date: 05/16/2023  Pt will demonstrate and verbalize ability to self-correct genu valgus position with single leg tasks and lifts for reduced knee pain and proper body mechanics  Baseline: min-mod multimodal cues from therapist  Goal status: INITIAL   NEW LONG TERM GOALS FOR UPDATED POC:  Target date: 05/30/2023  Pt will be compliant with at-home exercise program (lifting/pilates/etc.) for improved hip strength and reduced pain levels  Baseline: Pt unable to go to [place] due to kid's schedules Goal status: INITIAL    PLAN:  PT FREQUENCY: 1x/week  PT DURATION: 6 weeks + 6 weeks (recert)  PLANNED INTERVENTIONS: 16109- PT Re-evaluation, 97110-Therapeutic exercises, 97530- Therapeutic  activity, [id number]- Neuromuscular re-education, 97535- Self Care, [id number]- Manual therapy, [id number]- Gait training, [id number]- Aquatic Therapy, [id number]- Electrical stimulation (manual), Balance training, Stair training, Taping, and Dry Needling  PLAN FOR NEXT SESSION: Add to HEP for L quad and hip abduction strength. Single leg stability, jumping, boxing tasks, core stability, hamstrings    Persephonie Hegwood E Razi Hickle, PT, DPT 04/25/2023, 11:25 AM

## 2023-05-02 ENCOUNTER — Ambulatory Visit: Payer: BC Managed Care – PPO | Admitting: Physical Therapy

## 2023-05-02 DIAGNOSIS — G8929 Other chronic pain: Secondary | ICD-10-CM

## 2023-05-02 DIAGNOSIS — M6281 Muscle weakness (generalized): Secondary | ICD-10-CM

## 2023-05-02 DIAGNOSIS — R2689 Other abnormalities of gait and mobility: Secondary | ICD-10-CM

## 2023-05-02 NOTE — Therapy (Signed)
OUTPATIENT PHYSICAL THERAPY LOWER EXTREMITY TREATMENT   Patient Name: BAO MOOREFIELD MRN: [id number] DOB:04/26/1976, 47 y.o., female Today's Date: 05/02/2023  END OF SESSION:  PT End of Session - 05/02/23 0933     Visit Number 9    Number of Visits 13   recert   Date for PT Re-Evaluation 06/13/23   recert   Authorization Type BCBS    PT Start Time [phone]    PT Stop Time 1014    PT Time Calculation (min) 43 min    Activity Tolerance Patient tolerated treatment well    Behavior During Therapy [facility] for tasks assessed/performed                   Past Medical History:  Diagnosis Date   Asthma    Chronic kidney disease    stage 3 a per lov dr Lawson Fiscal foster 10-01-2019 on chart   Hiatal hernia    History of mitral valve prolapse    no problems since birth of son 13 yrs ago (2008)   Hydronephrosis 2021   Per patient   Hypertension    Porphyria (HCC)    Porphyria (HCC)    Shortness of breath    related to exertion   Past Surgical History:  Procedure Laterality Date   AXILLARY LYMPH NODE BIOPSY  2010   right    CHOLECYSTECTOMY     CYSTOSCOPY WITH RETROGRADE PYELOGRAM, URETEROSCOPY AND STENT PLACEMENT Right 10/15/2019   Procedure: CYSTOSCOPY WITH RETROGRADE PYELOGRAM, URETEROSCOPY AND STENT PLACEMENT, attempted laser of stricture;  Surgeon: Noel Christmas, MD;  Location: [facility];  Service: Urology;  Laterality: Right;  1 HR   DILITATION & CURRETTAGE/HYSTROSCOPY WITH NOVASURE ABLATION N/A 05/04/2016   Procedure: DILATATION & CURETTAGE/HYSTEROSCOPY WITH NOVASURE ABLATION;  Surgeon: Maxie Better, MD;  Location: [facility];  Service: Gynecology;  Laterality: N/A;   LAPAROSCOPIC CHOLECYSTECTOMY SINGLE PORT N/A 09/10/2013   Procedure: LAPAROSCOPIC CHOLECYSTECTOMY SINGLE SITE WITH CHOLANGIOGRAM;  Surgeon: Ardeth Sportsman, MD;  Location: [facility];  Service: General;  Laterality: N/A;   TUBAL LIGATION     Patient Active Problem List   Diagnosis Date Noted   BMI  27.0-27.9,adult 10/18/2021   Body mass index (BMI) of 25.0 to 25.9 in adult 10/12/2020   Subconjunctival hemorrhage of right eye 10/12/2020   Chronic renal disease, stage III (HCC) 04/11/2018   Asthma 03/01/2018   Benign hypertension with chronic kidney disease 02/26/2018   Chronic gastritis, mild.  H Pylori negative 07/31/2013   Chronic cholecystitis with calculus 07/31/2013    PCP: Dorothyann Peng, MD  REFERRING PROVIDER: Pollyann Savoy, MD  REFERRING DIAG: M25.561,M25.562,G89.29 (ICD-10-CM) - Chronic pain of both knees  THERAPY DIAG:  Muscle weakness (generalized)  Other abnormalities of gait and mobility  Chronic pain of both knees  Rationale for Evaluation and Treatment: Rehabilitation  ONSET DATE: 02/22/2023 (referral)   SUBJECTIVE:   SUBJECTIVE STATEMENT: Patient reports doing well. Denies pain or acute changes.   PERTINENT HISTORY: osteoarthritis and positive ANA PAIN:  Are you having pain? No  PRECAUTIONS: Fall  RED FLAGS: None   WEIGHT BEARING RESTRICTIONS: No  FALLS:  Has patient fallen in last 6 months? Yes. Number of falls 1 (fell while sparring)  LIVING ENVIRONMENT: Lives with: lives with their family and lives with their spouse Lives in: House/apartment Stairs: Yes: Internal: 16 steps; on right going up and External: 2 in garage, 1 in front steps; none Has following equipment at home: Single point cane, Walker -  2 wheeled, and Grab bars  OCCUPATION: Engineer   PLOF: Independent  PATIENT GOALS: "to relieve the constant pain"    OBJECTIVE:  Note: Objective measures were completed at Evaluation unless otherwise noted.  DIAGNOSTIC FINDINGS: X-ray of L knee from 10/24   No medial or lateral compartment narrowing was noted.  No patellofemoral narrowing was noted.   Impression: Unremarkable x-rays of the knee.   PATIENT SURVEYS:  LEFS 58/80 (mild to moderate functional limitation)  COGNITION: Overall cognitive status: Within  functional limits for tasks assessed     SENSATION: WFL  POSTURE: No Significant postural limitations  PALPATION: No TTP along patella, patellar tendon or joint line   LOWER EXTREMITY MMT: Tested in seated position  MMT Right eval Left eval  Hip flexion 5 5  Hip extension    Hip abduction 5 5  Hip adduction 5 5  Hip internal rotation    Hip external rotation    Knee flexion 5 5  Knee extension 5 4-  Ankle dorsiflexion 5 5  Ankle plantarflexion    Ankle inversion    Ankle eversion     (Blank rows = not tested)  LOWER EXTREMITY SPECIAL TESTS:  Knee special tests: Patella tap test (ballotable patella): negative  FUNCTIONAL TESTS:  SLS: >10s bilaterally, decreased stability on LLE w/compensated trendelenburg    TODAY'S TREATMENT:      Ther Ex  Elliptical level 1 for 8 minutes (4 minutes fwd and 4 retro) for improved BLE strength and cardiovascular warmup.  Standing march overs w/red theraband around feet, x15 reps per side, for improved hip flexor/abductor strength, single leg stability and functional core strength. Min cues for improved eccentric control, as pt wanting to slide foot back rather than lift.  Banded standing marches w/OH DB hold (8#), x8 per side, for improved hip flexor strength and core stability. Pt reported feeling movement in her low back, so regressed to holding weight at side, x10 per side.  Hip extensions on peanut ball, x15 reps, for improved posterior chain strength. Mod A to stabilize ball throughout but noted good glute max activation without hyperextension of lumbar spine.  Scorpions, x8 per side, for improved lumbar mobility. Pt more limited on L side > R side    PATIENT EDUCATION:  Education details: Continue HEP Person educated: Patient Education method: Explanation Education comprehension: verbalized understanding  HOME EXERCISE PROGRAM: Access Code: [email or web address] URL: [email or web address] Date: 03/07/2023 Prepared by:  Alethia Berthold Baylie Drakes  Exercises - Active Straight Leg Raise with Quad Set  - 1 x daily - 7 x weekly - 3 sets - 10 reps - Single Leg Bridge  - 1 x daily - 7 x weekly - 3 sets - 10 reps - Marching with Resistance  - 1 x daily - 7 x weekly - 3 sets - 10 reps - Backward Monster Walks  - 1 x daily - 7 x weekly - 3 sets - 10 reps - Forward Monster Walks  - 1 x daily - 7 x weekly - 3 sets - 10 reps - Side Stepping with Resistance at Feet  - 1 x daily - 7 x weekly - 3 sets - 10 reps  ASSESSMENT:  CLINICAL IMPRESSION: Emphasis of skilled PT session on single leg stability, posterior chain strength and functional core stability. Pt continues to be limited by core weakness, resulting in low back discomfort w/standing exercises, but does improve w/verbal cues. Will plan to work on kicking activities next week to reintegrate into [place]. Continue POC.  OBJECTIVE IMPAIRMENTS: decreased activity tolerance, decreased endurance, decreased mobility, difficulty walking, decreased strength, and pain.   ACTIVITY LIMITATIONS: bending, sitting, standing, squatting, stairs, and locomotion level  PARTICIPATION LIMITATIONS: shopping, community activity, occupation, and yard work  PERSONAL FACTORS: Fitness are also affecting patient's functional outcome.   REHAB POTENTIAL: Good  CLINICAL DECISION MAKING: Stable/uncomplicated  EVALUATION COMPLEXITY: Low   GOALS: Goals reviewed with patient? Yes  SHORT TERM GOALS: Target date: 03/24/2023  Pt will be independent with initial HEP for improved strength and pain management  Baseline: Goal status: MET  LONG TERM GOALS: Target date: 04/21/2023 (updated to match cert date)   Pt will return to [place] for improved strength and return to PLOF  Baseline: Pt has not returned since May  04/18/23: Pt has not returned due to scheduling, not due to pain  Goal status: NOT MET   2.  Pt will improve score on LEFS to >/= 68/80 for reduced pain levels and improved  functional mobility Baseline: 58/80 04/18/23: 73/80  Goal status: MET   NEW SHORT TERM GOALS FOR UPDATED POC:   Target date: 05/16/2023  Pt will demonstrate and verbalize ability to self-correct genu valgus position with single leg tasks and lifts for reduced knee pain and proper body mechanics  Baseline: min-mod multimodal cues from therapist  Goal status: INITIAL   NEW LONG TERM GOALS FOR UPDATED POC:  Target date: 05/30/2023  Pt will be compliant with at-home exercise program (lifting/pilates/etc.) for improved hip strength and reduced pain levels  Baseline: Pt unable to go to [place] due to kid's schedules Goal status: INITIAL    PLAN:  PT FREQUENCY: 1x/week  PT DURATION: 6 weeks + 6 weeks (recert)  PLANNED INTERVENTIONS: 82956- PT Re-evaluation, 97110-Therapeutic exercises, 97530- Therapeutic activity, [id number]- Neuromuscular re-education, 97535- Self Care, 21308- Manual therapy, [id number]- Gait training, [id number]- Aquatic Therapy, [phone]- Electrical stimulation (manual), Balance training, Stair training, Taping, and Dry Needling  PLAN FOR NEXT SESSION: Blaze pods on mirror - karate kicks? Kicks to boxing bag. Add to HEP for L quad and hip abduction strength. Single leg stability, jumping, boxing tasks, core stability, hamstrings    Thelma Lorenzetti E Hussain Maimone, PT, DPT 05/02/2023, 10:18 AM

## 2023-05-09 ENCOUNTER — Encounter: Payer: Self-pay | Admitting: Physical Therapy

## 2023-05-09 ENCOUNTER — Ambulatory Visit: Payer: BC Managed Care – PPO | Admitting: Physical Therapy

## 2023-05-09 VITALS — BP 132/81 | HR 62

## 2023-05-09 DIAGNOSIS — R2689 Other abnormalities of gait and mobility: Secondary | ICD-10-CM

## 2023-05-09 DIAGNOSIS — G8929 Other chronic pain: Secondary | ICD-10-CM

## 2023-05-09 DIAGNOSIS — M6281 Muscle weakness (generalized): Secondary | ICD-10-CM | POA: Diagnosis not present

## 2023-05-09 NOTE — Therapy (Signed)
 OUTPATIENT PHYSICAL THERAPY LOWER EXTREMITY TREATMENT / 10th Visit Progress Note   Patient Name: Shelby Campbell MRN: [id number] DOB:29-Sep-1975, 47 y.o., female Today's Date: 05/09/2023  END OF SESSION:  PT End of Session - 05/09/23 0932     Visit Number 10    Number of Visits 13    Date for PT Re-Evaluation 06/13/23    Authorization Type BCBS    PT Start Time 0932    PT Stop Time 1014    PT Time Calculation (min) 42 min    Activity Tolerance Patient tolerated treatment well    Behavior During Therapy [facility] for tasks assessed/performed             Past Medical History:  Diagnosis Date   Asthma    Chronic kidney disease    stage 3 a per lov dr katheryn foster 10-01-2019 on chart   Hiatal hernia    History of mitral valve prolapse    no problems since birth of son 13 yrs ago (2008)   Hydronephrosis 2021   Per patient   Hypertension    Porphyria (HCC)    Porphyria (HCC)    Shortness of breath    related to exertion   Past Surgical History:  Procedure Laterality Date   AXILLARY LYMPH NODE BIOPSY  2010   right    CHOLECYSTECTOMY     CYSTOSCOPY WITH RETROGRADE PYELOGRAM, URETEROSCOPY AND STENT PLACEMENT Right 10/15/2019   Procedure: CYSTOSCOPY WITH RETROGRADE PYELOGRAM, URETEROSCOPY AND STENT PLACEMENT, attempted laser of stricture;  Surgeon: Elisabeth Valli BIRCH, MD;  Location: [facility];  Service: Urology;  Laterality: Right;  1 HR   DILITATION & CURRETTAGE/HYSTROSCOPY WITH NOVASURE ABLATION N/A 05/04/2016   Procedure: DILATATION & CURETTAGE/HYSTEROSCOPY WITH NOVASURE ABLATION;  Surgeon: Dickie Carder, MD;  Location: [facility];  Service: Gynecology;  Laterality: N/A;   LAPAROSCOPIC CHOLECYSTECTOMY SINGLE PORT N/A 09/10/2013   Procedure: LAPAROSCOPIC CHOLECYSTECTOMY SINGLE SITE WITH CHOLANGIOGRAM;  Surgeon: Elspeth KYM Schultze, MD;  Location: [facility];  Service: General;  Laterality: N/A;   TUBAL LIGATION     Patient Active Problem List   Diagnosis Date Noted   BMI  27.0-27.9,adult 10/18/2021   Body mass index (BMI) of 25.0 to 25.9 in adult 10/12/2020   Subconjunctival hemorrhage of right eye 10/12/2020   Chronic renal disease, stage III (HCC) 04/11/2018   Asthma 03/01/2018   Benign hypertension with chronic kidney disease 02/26/2018   Chronic gastritis, mild.  H Pylori negative 07/31/2013   Chronic cholecystitis with calculus 07/31/2013    PCP: Jarold Medici, MD  REFERRING PROVIDER: Dolphus Reiter, MD  REFERRING DIAG: M25.561,M25.562,G89.29 (ICD-10-CM) - Chronic pain of both knees  THERAPY DIAG:  Muscle weakness (generalized)  Other abnormalities of gait and mobility  Chronic pain of both knees  Rationale for Evaluation and Treatment: Rehabilitation  ONSET DATE: 02/22/2023 (referral)   SUBJECTIVE:   SUBJECTIVE STATEMENT: Patient reports doing well. Reports some pain on the L side when getting up out of car. Denies falls and near falls. Reports has been busy and unable to do exercises.   PERTINENT HISTORY: osteoarthritis and positive ANA PAIN:  Are you having pain? Yes: NPRS scale: 1/10 Pain location: knees and low back Pain description: nagging Aggravating factors: picking up stuff Relieving factors: none, sometimes stretching  PRECAUTIONS: Fall  RED FLAGS: None   WEIGHT BEARING RESTRICTIONS: No  FALLS:  Has patient fallen in last 6 months? Yes. Number of falls 1 (fell while sparring)  LIVING ENVIRONMENT: Lives with: lives  with their family and lives with their spouse Lives in: House/apartment Stairs: Yes: Internal: 16 steps; on right going up and External: 2 in garage, 1 in front steps; none Has following equipment at home: Single point cane, Walker - 2 wheeled, and Grab bars  OCCUPATION: Engineer   PLOF: Independent  PATIENT GOALS: to relieve the constant pain    OBJECTIVE:  Note: Objective measures were completed at Evaluation unless otherwise noted.  DIAGNOSTIC FINDINGS: X-ray of L knee from 10/24    No medial or lateral compartment narrowing was noted.  No patellofemoral narrowing was noted.   Impression: Unremarkable x-rays of the knee.   PATIENT SURVEYS:  LEFS 58/80 (mild to moderate functional limitation)  COGNITION: Overall cognitive status: Within functional limits for tasks assessed     SENSATION: WFL  POSTURE: No Significant postural limitations  PALPATION: No TTP along patella, patellar tendon or joint line   LOWER EXTREMITY MMT: Tested in seated position  MMT Right eval Left eval  Hip flexion 5 5  Hip extension    Hip abduction 5 5  Hip adduction 5 5  Hip internal rotation    Hip external rotation    Knee flexion 5 5  Knee extension 5 4-  Ankle dorsiflexion 5 5  Ankle plantarflexion    Ankle inversion    Ankle eversion     (Blank rows = not tested)  LOWER EXTREMITY SPECIAL TESTS:  Knee special tests: Patella tap test (ballotable patella): negative  FUNCTIONAL TESTS:  SLS: >10s bilaterally, decreased stability on LLE w/compensated trendelenburg    TODAY'S TREATMENT:      Vitals:   05/09/23 0937  BP: 132/81  Pulse: 62  Assessed seated on RUE at rest, mildly elevated but [facility] for therapy  Ther Ex  Dynamic Warmup 2 x 20 feet High knees Glute kicks Toe walking Heel walking Tin man Over unders - modified to mini squat due to leading with left leg  Circuit Training x 3 times  Push kick waist high for single leg stability on mirror to squigz x 3 bi Goblet squat with 20 lb kettle bell x 8 reps  Circuit Training x 3 times  Round house kick waist high for single leg stability on mirror to squigz x 3 bi Single leg dead lift regressed to kick stand modified position x 8 reps bil with 20lb kettle bell added once form achieved  [employer] Push kicks x 5 slow speed, mild strength Push kicks x 10 slow speed, mod strength Round house kicks x 5 slow speed, mild strength Round house kicks x 10 slow speed, mod strength (mild discomfort in LLE  through rotation)    PATIENT EDUCATION:  Education details: Continue HEP + additions Person educated: Patient Education method: Explanation Education comprehension: verbalized understanding  HOME EXERCISE PROGRAM: Access Code: [email or web address] URL: [email or web address] Date: 03/07/2023 Prepared by: Marlon Plaster  Exercises - Active Straight Leg Raise with Quad Set  - 1 x daily - 7 x weekly - 3 sets - 10 reps - Single Leg Bridge  - 1 x daily - 7 x weekly - 3 sets - 10 reps - Marching with Resistance  - 1 x daily - 7 x weekly - 3 sets - 10 reps - Backward Monster Walks  - 1 x daily - 7 x weekly - 3 sets - 10 reps - Forward Monster Walks  - 1 x daily - 7 x weekly - 3 sets - 10 reps - Side Stepping with Resistance at Feet  -  1 x daily - 7 x weekly - 3 sets - 10 reps  Dynamic Warmup 2 x 10 feet High knees Glute kicks Toe walking Heel Walking Tin man Over [employer] and round kicks slow with mild strength to bag at home 3 x 5-10 sets without pain   ASSESSMENT:  CLINICAL IMPRESSION: Emphasis of skilled PT session on assessment of LTG given 10th visit progress note status as well as task specific Muay Thai training for return to PLOF and single leg stance stability. Initiated dynamic warmup for neural priming, stability, and mobility. Patient with mild tug in L knee with low squat and round house kick at  end of session. Discussed continued progression and compliance of HEP in order to continue to address deficits and minimize knee valgus likely contributing to pain. Patient overall progressing towards LTGs. Continue POC.     OBJECTIVE IMPAIRMENTS: decreased activity tolerance, decreased endurance, decreased mobility, difficulty walking, decreased strength, and pain.   ACTIVITY LIMITATIONS: bending, sitting, standing, squatting, stairs, and locomotion level  PARTICIPATION LIMITATIONS: shopping, community activity, occupation, and yard work  PERSONAL FACTORS:  Fitness are also affecting patient's functional outcome.   REHAB POTENTIAL: Good  CLINICAL DECISION MAKING: Stable/uncomplicated  EVALUATION COMPLEXITY: Low   GOALS: Goals reviewed with patient? Yes  SHORT TERM GOALS: Target date: 03/24/2023  Pt will be independent with initial HEP for improved strength and pain management  Baseline: Goal status: MET  LONG TERM GOALS: Target date: 04/21/2023 (updated to match cert date)   Pt will return to Muay Thai for improved strength and return to PLOF  Baseline: Pt has not returned since May  04/18/23: Pt has not returned due to scheduling, not due to pain  Goal status: NOT MET   2.  Pt will improve score on LEFS to >/= 68/80 for reduced pain levels and improved functional mobility Baseline: 58/80 04/18/23: 73/80  Goal status: MET   NEW SHORT TERM GOALS FOR UPDATED POC:   Target date: 05/16/2023  Pt will demonstrate and verbalize ability to self-correct genu valgus position with single leg tasks and lifts for reduced knee pain and proper body mechanics  Baseline: min-mod multimodal cues from therapist , min cues for mechanics  Goal status: IN PROGRESS   NEW LONG TERM GOALS FOR UPDATED POC:  Target date: 05/30/2023  Pt will be compliant with at-home exercise program (lifting/pilates/etc.) for improved hip strength and reduced pain levels  Baseline: Pt unable to go to Muay Thai due to kid's schedules, reports minimal compliance this week Goal status: IN PROGRESS    PLAN:  PT FREQUENCY: 1x/week  PT DURATION: 6 weeks + 6 weeks (recert)  PLANNED INTERVENTIONS: 02835- PT Re-evaluation, 97110-Therapeutic exercises, 97530- Therapeutic activity, [id number]- Neuromuscular re-education, 97535- Self Care, 02859- Manual therapy, [id number]- Gait training, [id number]- Aquatic Therapy, [phone]- Electrical stimulation (manual), Balance training, Stair training, Taping, and Dry Needling  PLAN FOR NEXT SESSION: Blaze pods on mirror - karate kicks? Kicks to  boxing bag progression building strength. Add to HEP for L quad and hip abduction strength. Single leg stability, jumping, boxing tasks, core stability, hamstrings    Lauraine Grumbling, PT, DPT 05/09/2023, 10:21 AM

## 2023-05-11 ENCOUNTER — Ambulatory Visit (INDEPENDENT_AMBULATORY_CARE_PROVIDER_SITE_OTHER): Payer: 59 | Admitting: Internal Medicine

## 2023-05-11 ENCOUNTER — Encounter: Payer: Self-pay | Admitting: Internal Medicine

## 2023-05-11 VITALS — BP 126/84 | HR 56 | Temp 97.8°F | Ht 64.0 in | Wt 161.2 lb

## 2023-05-11 DIAGNOSIS — N1831 Chronic kidney disease, stage 3a: Secondary | ICD-10-CM | POA: Diagnosis not present

## 2023-05-11 DIAGNOSIS — I129 Hypertensive chronic kidney disease with stage 1 through stage 4 chronic kidney disease, or unspecified chronic kidney disease: Secondary | ICD-10-CM | POA: Diagnosis not present

## 2023-05-11 DIAGNOSIS — Z0000 Encounter for general adult medical examination without abnormal findings: Secondary | ICD-10-CM | POA: Diagnosis not present

## 2023-05-11 DIAGNOSIS — J4520 Mild intermittent asthma, uncomplicated: Secondary | ICD-10-CM

## 2023-05-11 DIAGNOSIS — R519 Headache, unspecified: Secondary | ICD-10-CM

## 2023-05-11 DIAGNOSIS — Z8249 Family history of ischemic heart disease and other diseases of the circulatory system: Secondary | ICD-10-CM

## 2023-05-11 LAB — POCT URINALYSIS DIPSTICK
Bilirubin, UA: NEGATIVE
Blood, UA: NEGATIVE
Glucose, UA: NEGATIVE
Ketones, UA: NEGATIVE
Leukocytes, UA: NEGATIVE
Nitrite, UA: NEGATIVE
Protein, UA: NEGATIVE
Spec Grav, UA: 1.025 (ref 1.010–1.025)
Urobilinogen, UA: 0.2 U/dL
pH, UA: 6 (ref 5.0–8.0)

## 2023-05-11 MED ORDER — FLUTICASONE FUROATE-VILANTEROL 200-25 MCG/ACT IN AEPB: 1.0000 | INHALATION_SPRAY | Freq: Every day | RESPIRATORY_TRACT | 2 refills | Status: DC

## 2023-05-11 NOTE — Patient Instructions (Addendum)
 Magnesium glycinate Pure Encapsulations [employer]  Health Maintenance, Female Adopting a healthy lifestyle and getting preventive care are important in promoting health and wellness. Ask your health care provider about: The right schedule for you to have regular tests and exams. Things you can do on your own to prevent diseases and keep yourself healthy. What should I know about diet, weight, and exercise? Eat a healthy diet  Eat a diet that includes plenty of vegetables, fruits, low-fat dairy products, and lean protein. Do not eat a lot of foods that are high in solid fats, added sugars, or sodium. Maintain a healthy weight Body mass index (BMI) is used to identify weight problems. It estimates body fat based on height and weight. Your health care provider can help determine your BMI and help you achieve or maintain a healthy weight. Get regular exercise Get regular exercise. This is one of the most important things you can do for your health. Most adults should: Exercise for at least 150 minutes each week. The exercise should increase your heart rate and make you sweat (moderate-intensity exercise). Do strengthening exercises at least twice a week. This is in addition to the moderate-intensity exercise. Spend less time sitting. Even light physical activity can be beneficial. Watch cholesterol and blood lipids Have your blood tested for lipids and cholesterol at 46 years of age, then have this test every 5 years. Have your cholesterol levels checked more often if: Your lipid or cholesterol levels are high. You are older than 46 years of age. You are at high risk for heart disease. What should I know about cancer screening? Depending on your health history and family history, you may need to have cancer screening at various ages. This may include screening for: Breast cancer. Cervical cancer. Colorectal cancer. Skin cancer. Lung cancer. What should I know about heart disease, diabetes,  and high blood pressure? Blood pressure and heart disease High blood pressure causes heart disease and increases the risk of stroke. This is more likely to develop in people who have high blood pressure readings or are overweight. Have your blood pressure checked: Every 3-5 years if you are 55-51 years of age. Every year if you are 29 years old or older. Diabetes Have regular diabetes screenings. This checks your fasting blood sugar level. Have the screening done: Once every three years after age 70 if you are at a normal weight and have a low risk for diabetes. More often and at a younger age if you are overweight or have a high risk for diabetes. What should I know about preventing infection? Hepatitis B If you have a higher risk for hepatitis B, you should be screened for this virus. Talk with your health care provider to find out if you are at risk for hepatitis B infection. Hepatitis C Testing is recommended for: Everyone born from [birth year] through 1963. Anyone with known risk factors for hepatitis C. Sexually transmitted infections (STIs) Get screened for STIs, including gonorrhea and chlamydia, if: You are sexually active and are younger than 46 years of age. You are older than 46 years of age and your health care provider tells you that you are at risk for this type of infection. Your sexual activity has changed since you were last screened, and you are at increased risk for chlamydia or gonorrhea. Ask your health care provider if you are at risk. Ask your health care provider about whether you are at high risk for HIV. Your health care provider may recommend a  prescription medicine to help prevent HIV infection. If you choose to take medicine to prevent HIV, you should first get tested for HIV. You should then be tested every 3 months for as long as you are taking the medicine. Pregnancy If you are about to stop having your period (premenopausal) and you may become pregnant, seek  counseling before you get pregnant. Take 400 to 800 micrograms (mcg) of folic acid every day if you become pregnant. Ask for birth control (contraception) if you want to prevent pregnancy. Osteoporosis and menopause Osteoporosis is a disease in which the bones lose minerals and strength with aging. This can result in bone fractures. If you are 98 years old or older, or if you are at risk for osteoporosis and fractures, ask your health care provider if you should: Be screened for bone loss. Take a calcium or vitamin D  supplement to lower your risk of fractures. Be given hormone replacement therapy (HRT) to treat symptoms of menopause. Follow these instructions at home: Alcohol use Do not drink alcohol if: Your health care provider tells you not to drink. You are pregnant, may be pregnant, or are planning to become pregnant. If you drink alcohol: Limit how much you have to: 0-1 drink a day. Know how much alcohol is in your drink. In the [place] one drink equals one 12 oz bottle of beer (355 mL), one 5 oz glass of wine (148 mL), or one 1 oz glass of hard liquor (44 mL). Lifestyle Do not use any products that contain nicotine or tobacco. These products include cigarettes, chewing tobacco, and vaping devices, such as e-cigarettes. If you need help quitting, ask your health care provider. Do not use street drugs. Do not share needles. Ask your health care provider for help if you need support or information about quitting drugs. General instructions Schedule regular health, dental, and eye exams. Stay current with your vaccines. Tell your health care provider if: You often feel depressed. You have ever been abused or do not feel safe at home. Summary Adopting a healthy lifestyle and getting preventive care are important in promoting health and wellness. Follow your health care provider's instructions about healthy diet, exercising, and getting tested or screened for diseases. Follow your  health care provider's instructions on monitoring your cholesterol and blood pressure. This information is not intended to replace advice given to you by your health care provider. Make sure you discuss any questions you have with your health care provider. Document Revised: 09/14/2020 Document Reviewed: 09/14/2020 Elsevier Patient Education  2024 [employer].

## 2023-05-11 NOTE — Progress Notes (Signed)
 I,[name], CMA,acting as a neurosurgeon for [name], MD.,have documented all relevant documentation on the behalf of [name], MD,as directed by  [name], MD while in the presence of [name], MD.  Subjective:    Patient ID: Shelby Campbell , female    DOB: 01-04-1976 , 48 y.o.   MRN: [id number]  Chief Complaint  Patient presents with   Annual Exam   Hypertension    HPI  The patient is here for a physical examination. She is followed by Dr. Rutherford for her pap smears. She reports compliance with meds. Denies headaches, chest pain and shortness of breath. She reports having frequent headaches since October. She admits at times she experiences dizziness. She is not sure what is triggering her symptoms.  Tylenol  does help to relieve the headache.   Letter sent to GYN for mammogram results.  Hypertension This is a chronic problem. The current episode started more than 1 year ago. The problem has been gradually improving since onset. The problem is uncontrolled. Associated symptoms include headaches. Pertinent negatives include no blurred vision. Past treatments include beta blockers and calcium channel blockers. The current treatment provides moderate improvement. Hypertensive end-organ damage includes kidney disease.     Past Medical History:  Diagnosis Date   Asthma    Chronic kidney disease    stage 3 a per lov dr katheryn foster 10-01-2019 on chart   Hiatal hernia    History of mitral valve prolapse    no problems since birth of son 13 yrs ago (2008)   Hydronephrosis 2021   Per patient   Hypertension    Porphyria (HCC)    Porphyria (HCC)    Shortness of breath    related to exertion     Family History  Problem Relation Age of Onset   Hypertension Mother    Arthritis Mother    Hypertension Father    Prostate cancer Father    Arthritis Father    Gout Father    Healthy Son    Healthy Daughter      Current Outpatient Medications:     acetaminophen  (TYLENOL ) 500 MG tablet, Take 1,000 mg by mouth 2 (two) times daily as needed for headache (or flank pain)., Disp: , Rfl:    albuterol  (VENTOLIN  HFA) 108 (90 Base) MCG/ACT inhaler, Inhale 2 puffs into the lungs every 6 (six) hours as needed for wheezing or shortness of breath., Disp: 54 g, Rfl: 3   amLODipine (NORVASC) 2.5 MG tablet, Take 2.5 mg by mouth daily., Disp: , Rfl:    ascorbic acid (VITAMIN C) 250 MG CHEW, 2 chewables Orally Once a day, Disp: , Rfl:    cholecalciferol (VITAMIN D3) 25 MCG (1000 UNIT) tablet, Take 1,000 Units by mouth daily. , Disp: , Rfl:    famotidine  (PEPCID ) 20 MG tablet, TAKE 1 TABLET(20 MG) BY MOUTH DAILY, Disp: 90 tablet, Rfl: 1   metoprolol  succinate (TOPROL -XL) 50 MG 24 hr tablet, TAKE 1 TABLET(50 MG) BY MOUTH DAILY, Disp: 90 tablet, Rfl: 2   montelukast  (SINGULAIR ) 10 MG tablet, TAKE 1 TABLET(10 MG) BY MOUTH DAILY, Disp: 90 tablet, Rfl: 2   Multiple Vitamin (MULTIVITAMIN) tablet, Take 1 tablet by mouth daily., Disp: , Rfl:    Multiple Vitamins-Minerals (HAIR SKIN & NAILS) TABS, 2500 mcg 2 gummies Orally one a day, Disp: , Rfl:    fluticasone  furoate-vilanterol (BREO ELLIPTA ) 200-25 MCG/ACT AEPB, Inhale 1 puff into the lungs daily., Disp: 180 each, Rfl:  2   Allergies  Allergen Reactions   Nsaids     And ibuprofen cannot take due to chronic kidney disease   Other Other (See Comments)    Sulfonamides, sulfonylureas, barbiturates, antifungals, ketamine, etomidate, rifapentine, rifampicin, rifabutine, nitrofurantoin, metronidazole, ergot derivatives, indinavir, nevirapine, ritonavir, saquinavir, progestogens, progesterones, carbamazepine, phenytoin, valproate, oxycodone , pentazocine, cocaine, gold antirheumatics agents, sodium aurothiomalate, methyldopa, fenfluramine, ethosuzimide, flupentixol, flutamide, disulfiram, lidocaine     Sulfa Antibiotics Other (See Comments)    Heart beating fast       The patient states she uses tubal ligation for birth  control. No LMP recorded (exact date). Patient has had an ablation.. Negative for Dysmenorrhea. Negative for: breast discharge, breast lump(s), breast pain and breast self exam. Associated symptoms include abnormal vaginal bleeding. Pertinent negatives include abnormal bleeding (hematology), anxiety, decreased libido, depression, difficulty falling sleep, dyspareunia, history of infertility, nocturia, sexual dysfunction, sleep disturbances, urinary incontinence, urinary urgency, vaginal discharge and vaginal itching. Diet regular.The patient states her exercise level is  intermittent.  . The patient's tobacco use is:  Social History   Tobacco Use  Smoking Status Never  Smokeless Tobacco Never  . She has been exposed to passive smoke. The patient's alcohol use is:  Social History   Substance and Sexual Activity  Alcohol Use Yes   Comment: occasional    Review of Systems  Constitutional: Negative.   HENT: Negative.    Eyes: Negative.  Negative for blurred vision.  Respiratory: Negative.    Cardiovascular: Negative.   Gastrointestinal: Negative.   Endocrine: Negative.   Genitourinary: Negative.   Musculoskeletal: Negative.   Skin: Negative.   Allergic/Immunologic: Negative.   Neurological:  Positive for headaches.       She c/o persistent headaches. Started at least 3 months ago. Has h/o migraines, these are different. Starts as a sharp, shooting pain. No visual disturbance, no nausea. No UE/LE weakness. Sometimes associated with dizziness.   Hematological: Negative.   Psychiatric/Behavioral: Negative.       Today's Vitals   05/11/23 1009  BP: 126/84  Pulse: (!) 56  Temp: 97.8 F (36.6 C)  SpO2: 98%  Weight: 161 lb 3.2 oz (73.1 kg)  Height: 5' 4 (1.626 m)   Body mass index is 27.67 kg/m.  Wt Readings from Last 3 Encounters:  05/11/23 161 lb 3.2 oz (73.1 kg)  02/22/23 163 lb (73.9 kg)  10/19/22 165 lb 3.2 oz (74.9 kg)     Objective:  Physical Exam Vitals and nursing  note reviewed.  Constitutional:      Appearance: Normal appearance.  HENT:     Head: Normocephalic and atraumatic.     Right Ear: Tympanic membrane, ear canal and external ear normal.     Left Ear: Tympanic membrane, ear canal and external ear normal.     Nose: Nose normal.     Mouth/Throat:     Mouth: Mucous membranes are moist.     Pharynx: Oropharynx is clear.  Eyes:     Extraocular Movements: Extraocular movements intact.     Conjunctiva/sclera: Conjunctivae normal.     Pupils: Pupils are equal, round, and reactive to light.  Cardiovascular:     Rate and Rhythm: Normal rate and regular rhythm.     Pulses: Normal pulses.          Dorsalis pedis pulses are 2+ on the right side and 2+ on the left side.     Heart sounds: Normal heart sounds.  Pulmonary:     Effort: Pulmonary effort is  normal.     Breath sounds: Normal breath sounds.  Chest:  Breasts:    Tanner Score is 5.     Right: Normal.     Left: Normal.  Abdominal:     General: Bowel sounds are normal.     Palpations: Abdomen is soft.  Genitourinary:    Comments: deferred Musculoskeletal:        General: Normal range of motion.     Cervical back: Normal range of motion and neck supple.  Skin:    General: Skin is warm and dry.  Neurological:     General: No focal deficit present.     Mental Status: She is alert and oriented to person, place, and time.  Psychiatric:        Mood and Affect: Mood normal.        Behavior: Behavior normal.         Assessment And Plan:     Encounter for general adult medical examination w/o abnormal findings Assessment & Plan: A full exam was performed.  Importance of monthly self breast exams was discussed with the patient.  She is advised to get 30-45 minutes of regular exercise, no less than four to five days per week. Both weight-bearing and aerobic exercises are recommended.  She is advised to follow a healthy diet with at least six fruits/veggies per day, decrease intake of red  meat and other saturated fats and to increase fish intake to twice weekly.  Meats/fish should not be fried -- baked, boiled or broiled is preferable. It is also important to cut back on your sugar intake.  Be sure to read labels - try to avoid anything with added sugar, high fructose corn syrup or other sweeteners.  If you must use a sweetener, you can try stevia or monkfruit.  It is also important to avoid artificially sweetened foods/beverages and diet drinks. Lastly, wear SPF 50 sunscreen on exposed skin and when in direct sunlight for an extended period of time.  Be sure to avoid fast food restaurants and aim for at least 60 ounces of water daily.      Orders: -     CBC -     Lipid panel  Benign hypertension with chronic kidney disease Assessment & Plan: Chronic, fair control.  Goal BP<120/80.   EKG performed, NSR w/o acute changes. She will continue with amlodipine 2.5mg  daily and metoprolol  succinate XL 50mg  daily. She is encouraged to follow a low sodium diet. She will f/u in four to six months for re-evaluation.   Orders: -     POCT urinalysis dipstick -     Microalbumin / creatinine urine ratio -     EKG 12-Lead -     Lipid panel -     Hemoglobin A1c -     Hepatic function panel -     TSH  Stage 3a chronic kidney disease (HCC) Assessment & Plan: Chronic, she is encouraged to stay well hydrated, avoid NSAIDs and keep BP controlled to prevent progression of CKD.    Orders: -     POCT urinalysis dipstick -     Microalbumin / creatinine urine ratio -     CBC  Mild intermittent asthma without complication Assessment & Plan: Chronic, sx are stable.  She will continue with Breo ellipta  200/56mcg one puff daily. She is encouraged to avoid known triggers.   Orders: -     Fluticasone  Furoate-Vilanterol; Inhale 1 puff into the lungs daily.  Dispense: 180 each;  Refill: 2  Frontal headache Assessment & Plan: She is encouraged to stay well hydrated.  I question if some of her sx are  due to elevated blood pressures.  She is encouraged to keep BP logs, especially during symptomatic periods.  She may benefit from Mg supplementation as well.   Orders: -     CT HEAD WO CONTRAST ( ); Future  Family history of cerebral aneurysm     Return in 3 weeks (on 06/01/2023), or NV-bp check, for 1 year physical, 6 month bp. Patient was given opportunity to ask questions. Patient verbalized understanding of the plan and was able to repeat key elements of the plan. All questions were answered to their satisfaction.     I, [name], MD, have reviewed all documentation for this visit. The documentation on 05/11/23 for the exam, diagnosis, procedures, and orders are all accurate and complete.

## 2023-05-11 NOTE — Assessment & Plan Note (Signed)

## 2023-05-11 NOTE — Assessment & Plan Note (Addendum)
 Chronic, fair control.  Goal BP<120/80.   EKG performed, NSR w/o acute changes. She will continue with amlodipine 2.5mg  daily and metoprolol  succinate XL 50mg  daily. She is encouraged to follow a low sodium diet. She will f/u in four to six months for re-evaluation.

## 2023-05-11 NOTE — Assessment & Plan Note (Signed)
 Chronic, she is encouraged to stay well hydrated, avoid NSAIDs and keep BP controlled to prevent progression of CKD.

## 2023-05-12 LAB — HEPATIC FUNCTION PANEL
ALT: 16 [IU]/L (ref 0–32)
AST: 24 [IU]/L (ref 0–40)
Albumin: 4.3 g/dL (ref 3.9–4.9)
Alkaline Phosphatase: 61 [IU]/L (ref 44–121)
Bilirubin Total: 0.5 mg/dL (ref 0.0–1.2)
Bilirubin, Direct: 0.2 mg/dL (ref 0.00–0.40)
Total Protein: 7.2 g/dL (ref 6.0–8.5)

## 2023-05-12 LAB — CBC
Hematocrit: 39.5 % (ref 34.0–46.6)
Hemoglobin: 13 g/dL (ref 11.1–15.9)
MCH: 30.4 pg (ref 26.6–33.0)
MCHC: 32.9 g/dL (ref 31.5–35.7)
MCV: 92 fL (ref 79–97)
Platelets: 265 10*3/uL (ref 150–450)
RBC: 4.28 x10E6/uL (ref 3.77–5.28)
RDW: 12.3 % (ref 11.7–15.4)
WBC: 4.7 10*3/uL (ref 3.4–10.8)

## 2023-05-12 LAB — LIPID PANEL
Chol/HDL Ratio: 2.6 {ratio} (ref 0.0–4.4)
Cholesterol, Total: 184 mg/dL (ref 100–199)
HDL: 70 mg/dL (ref >39)
LDL Chol Calc (NIH): 98 mg/dL (ref 0–99)
Triglycerides: 90 mg/dL (ref 0–149)
VLDL Cholesterol Cal: 16 mg/dL (ref 5–40)

## 2023-05-12 LAB — HEMOGLOBIN A1C
Est. average glucose Bld gHb Est-mCnc: 111 mg/dL
Hgb A1c MFr Bld: 5.5 % (ref 4.8–5.6)

## 2023-05-12 LAB — MICROALBUMIN / CREATININE URINE RATIO
Creatinine, Urine: 171.4 mg/dL
Microalb/Creat Ratio: 4 mg/g{creat} (ref 0–29)
Microalbumin, Urine: 7.1 ug/mL

## 2023-05-12 LAB — TSH: TSH: 2.83 u[IU]/mL (ref 0.450–4.500)

## 2023-05-14 DIAGNOSIS — R519 Headache, unspecified: Secondary | ICD-10-CM | POA: Insufficient documentation

## 2023-05-14 NOTE — Assessment & Plan Note (Signed)
 Chronic, sx are stable.  She will continue with Breo ellipta 200/39mcg one puff daily. She is encouraged to avoid known triggers.

## 2023-05-14 NOTE — Assessment & Plan Note (Signed)
 She is encouraged to stay well hydrated.  I question if some of her sx are due to elevated blood pressures.  She is encouraged to keep BP logs, especially during symptomatic periods.  She may benefit from Mg supplementation as well.

## 2023-05-16 ENCOUNTER — Ambulatory Visit: Payer: 59 | Attending: Rheumatology | Admitting: Physical Therapy

## 2023-05-16 DIAGNOSIS — R2689 Other abnormalities of gait and mobility: Secondary | ICD-10-CM | POA: Diagnosis present

## 2023-05-16 DIAGNOSIS — M25562 Pain in left knee: Secondary | ICD-10-CM | POA: Diagnosis present

## 2023-05-16 DIAGNOSIS — M6281 Muscle weakness (generalized): Secondary | ICD-10-CM | POA: Diagnosis present

## 2023-05-16 DIAGNOSIS — G8929 Other chronic pain: Secondary | ICD-10-CM | POA: Insufficient documentation

## 2023-05-16 DIAGNOSIS — M25561 Pain in right knee: Secondary | ICD-10-CM | POA: Insufficient documentation

## 2023-05-16 NOTE — Therapy (Signed)
 OUTPATIENT PHYSICAL THERAPY LOWER EXTREMITY TREATMENT    Patient Name: Shelby Campbell MRN: [id number] DOB:May 31, 1975, 48 y.o., female Today's Date: 05/16/2023  END OF SESSION:  PT End of Session - 05/16/23 0852     Visit Number 11    Number of Visits 13    Date for PT Re-Evaluation 06/13/23    Authorization Type BCBS    PT Start Time [phone]    PT Stop Time 0930    PT Time Calculation (min) 43 min    Activity Tolerance Patient tolerated treatment well    Behavior During Therapy [facility] for tasks assessed/performed             Past Medical History:  Diagnosis Date   Asthma    Chronic kidney disease    stage 3 a per lov dr katheryn foster 10-01-2019 on chart   Hiatal hernia    History of mitral valve prolapse    no problems since birth of son 13 yrs ago (2008)   Hydronephrosis 2021   Per patient   Hypertension    Porphyria (HCC)    Porphyria (HCC)    Shortness of breath    related to exertion   Past Surgical History:  Procedure Laterality Date   AXILLARY LYMPH NODE BIOPSY  2010   right    CHOLECYSTECTOMY     CYSTOSCOPY WITH RETROGRADE PYELOGRAM, URETEROSCOPY AND STENT PLACEMENT Right 10/15/2019   Procedure: CYSTOSCOPY WITH RETROGRADE PYELOGRAM, URETEROSCOPY AND STENT PLACEMENT, attempted laser of stricture;  Surgeon: Elisabeth Valli BIRCH, MD;  Location: [facility] [place] [facility];  Service: Urology;  Laterality: Right;  1 HR   DILITATION & CURRETTAGE/HYSTROSCOPY WITH NOVASURE ABLATION N/A 05/04/2016   Procedure: DILATATION & CURETTAGE/HYSTEROSCOPY WITH NOVASURE ABLATION;  Surgeon: Dickie Carder, MD;  Location: [facility];  Service: Gynecology;  Laterality: N/A;   LAPAROSCOPIC CHOLECYSTECTOMY SINGLE PORT N/A 09/10/2013   Procedure: LAPAROSCOPIC CHOLECYSTECTOMY SINGLE SITE WITH CHOLANGIOGRAM;  Surgeon: Elspeth KYM Schultze, MD;  Location: [facility];  Service: General;  Laterality: N/A;   TUBAL LIGATION     Patient Active Problem List   Diagnosis Date Noted   Frontal headache 05/14/2023    Encounter for general adult medical examination w/o abnormal findings 05/11/2023   BMI 27.0-27.9,adult 10/18/2021   Body mass index (BMI) of 25.0 to 25.9 in adult 10/12/2020   Subconjunctival hemorrhage of right eye 10/12/2020   Chronic renal disease, stage III (HCC) 04/11/2018   Asthma 03/01/2018   Benign hypertension with chronic kidney disease 02/26/2018   Chronic gastritis, mild.  H Pylori negative 07/31/2013   Chronic cholecystitis with calculus 07/31/2013    PCP: Jarold Medici, MD  REFERRING PROVIDER: Dolphus Reiter, MD  REFERRING DIAG: M25.561,M25.562,G89.29 (ICD-10-CM) - Chronic pain of both knees  THERAPY DIAG:  Muscle weakness (generalized)  Other abnormalities of gait and mobility  Chronic pain of both knees  Rationale for Evaluation and Treatment: Rehabilitation  ONSET DATE: 02/22/2023 (referral)   SUBJECTIVE:   SUBJECTIVE STATEMENT: Patient reports doing well. Having some low back pain today.  No changes to report.   PERTINENT HISTORY: osteoarthritis and positive ANA PAIN:  Are you having pain? Yes: NPRS scale: 2/10 Pain location: knees and low back Pain description: nagging Aggravating factors: picking up stuff Relieving factors: none, sometimes stretching  PRECAUTIONS: Fall  RED FLAGS: None   WEIGHT BEARING RESTRICTIONS: No  FALLS:  Has patient fallen in last 6 months? Yes. Number of falls 1 (fell while sparring)  LIVING ENVIRONMENT: Lives with: lives with their family  and lives with their spouse Lives in: House/apartment Stairs: Yes: Internal: 16 steps; on right going up and External: 2 in garage, 1 in front steps; none Has following equipment at home: Single point cane, Walker - 2 wheeled, and Grab bars  OCCUPATION: Engineer   PLOF: Independent  PATIENT GOALS: to relieve the constant pain    OBJECTIVE:  Note: Objective measures were completed at Evaluation unless otherwise noted.  DIAGNOSTIC FINDINGS: X-ray of L knee from  10/24   No medial or lateral compartment narrowing was noted.  No patellofemoral narrowing was noted.   Impression: Unremarkable x-rays of the knee.   PATIENT SURVEYS:  LEFS 58/80 (mild to moderate functional limitation)  COGNITION: Overall cognitive status: Within functional limits for tasks assessed     SENSATION: WFL  POSTURE: No Significant postural limitations  PALPATION: No TTP along patella, patellar tendon or joint line   LOWER EXTREMITY MMT: Tested in seated position  MMT Right eval Left eval  Hip flexion 5 5  Hip extension    Hip abduction 5 5  Hip adduction 5 5  Hip internal rotation    Hip external rotation    Knee flexion 5 5  Knee extension 5 4-  Ankle dorsiflexion 5 5  Ankle plantarflexion    Ankle inversion    Ankle eversion     (Blank rows = not tested)  LOWER EXTREMITY SPECIAL TESTS:  Knee special tests: Patella tap test (ballotable patella): negative  FUNCTIONAL TESTS:  SLS: >10s bilaterally, decreased stability on LLE w/compensated trendelenburg    TODAY'S TREATMENT:      Ther Ex  Elliptical level 1 for 8 minutes (4 fwd and 4 retro) for improved BLE strength and cardiovascular warmup.  Isometric hip adduction using foam roller at wall w/ 4# med ball rotation to contralateral hip for improved single leg stability, glute med strength and core stability. Pt very challenged by this, especially on LLE, and required min A to stabilize. Pt unable to perform more than 2 reps at a time due to glute med weakness and ankle instability.  Single leg RDLs w/single UE support w/PVC and placement of foam roller on moving leg to facilitate increased core and hip flexor engagement, 2x12 reps per side. Continued difficulty performing on LLE and noted very narrow foot placement w/LLE, resulting in inversion of L ankle.  Seated on blue theraball, alt marches w/3s isometric hold, x12 per side. Pt initially leaning too far R when lifting LLE, but w/cues for TA  activation, pt able to stabilize well. Progressed to alt 5# OH press w/marches, x10 per side. Pt more stable with this and reported feeling movement in core and hip flexors. No pain following session.    PATIENT EDUCATION:  Education details: Continue HEP  Person educated: Patient Education method: Explanation Education comprehension: verbalized understanding  HOME EXERCISE PROGRAM: Access Code: [email or web address] URL: [email or web address] Date: 03/07/2023 Prepared by: Marlon Tamlyn Sides  Exercises - Active Straight Leg Raise with Quad Set  - 1 x daily - 7 x weekly - 3 sets - 10 reps - Single Leg Bridge  - 1 x daily - 7 x weekly - 3 sets - 10 reps - Marching with Resistance  - 1 x daily - 7 x weekly - 3 sets - 10 reps - Backward Monster Walks  - 1 x daily - 7 x weekly - 3 sets - 10 reps - Forward Monster Walks  - 1 x daily - 7 x weekly - 3 sets - 10 reps -  Side Stepping with Resistance at Feet  - 1 x daily - 7 x weekly - 3 sets - 10 reps  Dynamic Warmup 2 x 10 feet High knees Glute kicks Toe walking Heel Walking Tin man [employer] and round kicks slow with mild strength to bag at home 3 x 5-10 sets without pain   ASSESSMENT:  CLINICAL IMPRESSION: Emphasis of skilled PT session on single leg stability, glute med strength and functional core strength. Pt continues to be limited by glute med/max weakness on LLE as well as decreased core stability in standing, but did improve when on unlevel seated surface. Pt relies heavily on trendelenburg compensation when in single leg stance on LLE as well as narrow L foot placement, resulting in tendency to invert L ankle. Continue POC.     OBJECTIVE IMPAIRMENTS: decreased activity tolerance, decreased endurance, decreased mobility, difficulty walking, decreased strength, and pain.   ACTIVITY LIMITATIONS: bending, sitting, standing, squatting, stairs, and locomotion level  PARTICIPATION LIMITATIONS: shopping, community  activity, occupation, and yard work  PERSONAL FACTORS: Fitness are also affecting patient's functional outcome.   REHAB POTENTIAL: Good  CLINICAL DECISION MAKING: Stable/uncomplicated  EVALUATION COMPLEXITY: Low   GOALS: Goals reviewed with patient? Yes  SHORT TERM GOALS: Target date: 03/24/2023  Pt will be independent with initial HEP for improved strength and pain management  Baseline: Goal status: MET  LONG TERM GOALS: Target date: 04/21/2023 (updated to match cert date)   Pt will return to Muay Thai for improved strength and return to PLOF  Baseline: Pt has not returned since May  04/18/23: Pt has not returned due to scheduling, not due to pain  Goal status: NOT MET   2.  Pt will improve score on LEFS to >/= 68/80 for reduced pain levels and improved functional mobility Baseline: 58/80 04/18/23: 73/80  Goal status: MET   NEW SHORT TERM GOALS FOR UPDATED POC:   Target date: 05/16/2023  Pt will demonstrate and verbalize ability to self-correct genu valgus position with single leg tasks and lifts for reduced knee pain and proper body mechanics  Baseline: min-mod multimodal cues from therapist , min cues for mechanics  Goal status: IN PROGRESS   NEW LONG TERM GOALS FOR UPDATED POC:  Target date: 05/30/2023  Pt will be compliant with at-home exercise program (lifting/pilates/etc.) for improved hip strength and reduced pain levels  Baseline: Pt unable to go to Muay Thai due to kid's schedules, reports minimal compliance this week Goal status: IN PROGRESS    PLAN:  PT FREQUENCY: 1x/week  PT DURATION: 6 weeks + 6 weeks (recert)  PLANNED INTERVENTIONS: 02835- PT Re-evaluation, 97110-Therapeutic exercises, 97530- Therapeutic activity, [id number]- Neuromuscular re-education, 97535- Self Care, 02859- Manual therapy, [id number]- Gait training, [id number]- Aquatic Therapy, [phone]- Electrical stimulation (manual), Balance training, Stair training, Taping, and Dry Needling  PLAN FOR NEXT  SESSION: Blaze pods on mirror - karate kicks? Kicks to boxing bag progression building strength. Add to HEP for L quad and hip abduction strength. Single leg stability, jumping, boxing tasks, core stability, hamstrings, box jumps   Denicia Pagliarulo E Love Chowning, PT, DPT 05/16/2023, 9:32 AM

## 2023-05-23 ENCOUNTER — Ambulatory Visit: Payer: 59 | Admitting: Physical Therapy

## 2023-05-23 DIAGNOSIS — G8929 Other chronic pain: Secondary | ICD-10-CM

## 2023-05-23 DIAGNOSIS — R2689 Other abnormalities of gait and mobility: Secondary | ICD-10-CM

## 2023-05-23 DIAGNOSIS — M6281 Muscle weakness (generalized): Secondary | ICD-10-CM | POA: Diagnosis not present

## 2023-05-23 NOTE — Therapy (Signed)
 OUTPATIENT PHYSICAL THERAPY LOWER EXTREMITY TREATMENT    Patient Name: Shelby Campbell MRN: [id number] DOB:October 14, 1975, 48 y.o., female Today's Date: 05/23/2023  END OF SESSION:  PT End of Session - 05/23/23 0848     Visit Number 12    Number of Visits 13    Date for PT Re-Evaluation 06/13/23    Authorization Type BCBS    PT Start Time 0847    PT Stop Time 0931    PT Time Calculation (min) 44 min    Activity Tolerance Patient tolerated treatment well    Behavior During Therapy [facility] for tasks assessed/performed              Past Medical History:  Diagnosis Date   Asthma    Chronic kidney disease    stage 3 a per lov dr katheryn foster 10-01-2019 on chart   Hiatal hernia    History of mitral valve prolapse    no problems since birth of son 13 yrs ago (2008)   Hydronephrosis 2021   Per patient   Hypertension    Porphyria (HCC)    Porphyria (HCC)    Shortness of breath    related to exertion   Past Surgical History:  Procedure Laterality Date   AXILLARY LYMPH NODE BIOPSY  2010   right    CHOLECYSTECTOMY     CYSTOSCOPY WITH RETROGRADE PYELOGRAM, URETEROSCOPY AND STENT PLACEMENT Right 10/15/2019   Procedure: CYSTOSCOPY WITH RETROGRADE PYELOGRAM, URETEROSCOPY AND STENT PLACEMENT, attempted laser of stricture;  Surgeon: Elisabeth Valli BIRCH, MD;  Location: [facility];  Service: Urology;  Laterality: Right;  1 HR   DILITATION & CURRETTAGE/HYSTROSCOPY WITH NOVASURE ABLATION N/A 05/04/2016   Procedure: DILATATION & CURETTAGE/HYSTEROSCOPY WITH NOVASURE ABLATION;  Surgeon: Dickie Carder, MD;  Location: [facility];  Service: Gynecology;  Laterality: N/A;   LAPAROSCOPIC CHOLECYSTECTOMY SINGLE PORT N/A 09/10/2013   Procedure: LAPAROSCOPIC CHOLECYSTECTOMY SINGLE SITE WITH CHOLANGIOGRAM;  Surgeon: Elspeth KYM Schultze, MD;  Location: [facility];  Service: General;  Laterality: N/A;   TUBAL LIGATION     Patient Active Problem List   Diagnosis Date Noted   Frontal headache 05/14/2023    Encounter for general adult medical examination w/o abnormal findings 05/11/2023   BMI 27.0-27.9,adult 10/18/2021   Body mass index (BMI) of 25.0 to 25.9 in adult 10/12/2020   Subconjunctival hemorrhage of right eye 10/12/2020   Chronic renal disease, stage III (HCC) 04/11/2018   Asthma 03/01/2018   Benign hypertension with chronic kidney disease 02/26/2018   Chronic gastritis, mild.  H Pylori negative 07/31/2013   Chronic cholecystitis with calculus 07/31/2013    PCP: Jarold Medici, MD  REFERRING PROVIDER: Dolphus Reiter, MD  REFERRING DIAG: M25.561,M25.562,G89.29 (ICD-10-CM) - Chronic pain of both knees  THERAPY DIAG:  Muscle weakness (generalized)  Other abnormalities of gait and mobility  Chronic pain of both knees  Rationale for Evaluation and Treatment: Rehabilitation  ONSET DATE: 02/22/2023 (referral)   SUBJECTIVE:   SUBJECTIVE STATEMENT: Patient reports doing well. No knee pain today but low back is a bit funky today.   PERTINENT HISTORY: osteoarthritis and positive ANA PAIN:  Are you having pain? Yes: NPRS scale: 2/10 Pain location: knees and low back Pain description: nagging Aggravating factors: picking up stuff Relieving factors: none, sometimes stretching  PRECAUTIONS: Fall  RED FLAGS: None   WEIGHT BEARING RESTRICTIONS: No  FALLS:  Has patient fallen in last 6 months? Yes. Number of falls 1 (fell while sparring)  LIVING ENVIRONMENT: Lives with: lives with  their family and lives with their spouse Lives in: House/apartment Stairs: Yes: Internal: 16 steps; on right going up and External: 2 in garage, 1 in front steps; none Has following equipment at home: Single point cane, Walker - 2 wheeled, and Grab bars  OCCUPATION: Engineer   PLOF: Independent  PATIENT GOALS: to relieve the constant pain    OBJECTIVE:  Note: Objective measures were completed at Evaluation unless otherwise noted.  DIAGNOSTIC FINDINGS: X-ray of L knee from  10/24   No medial or lateral compartment narrowing was noted.  No patellofemoral narrowing was noted.   Impression: Unremarkable x-rays of the knee.   PATIENT SURVEYS:  LEFS 58/80 (mild to moderate functional limitation)  COGNITION: Overall cognitive status: Within functional limits for tasks assessed     SENSATION: WFL  POSTURE: No Significant postural limitations  PALPATION: No TTP along patella, patellar tendon or joint line   LOWER EXTREMITY MMT: Tested in seated position  MMT Right eval Left eval  Hip flexion 5 5  Hip extension    Hip abduction 5 5  Hip adduction 5 5  Hip internal rotation    Hip external rotation    Knee flexion 5 5  Knee extension 5 4-  Ankle dorsiflexion 5 5  Ankle plantarflexion    Ankle inversion    Ankle eversion     (Blank rows = not tested)  LOWER EXTREMITY SPECIAL TESTS:  Knee special tests: Patella tap test (ballotable patella): negative  FUNCTIONAL TESTS:  SLS: >10s bilaterally, decreased stability on LLE w/compensated trendelenburg    TODAY'S TREATMENT:      Ther Ex  SciFit multi-peaks level 8 for 8 minutes using BUE/BLEs for neural priming for reciprocal movement, dynamic cardiovascular warmup and increased amplitude of stepping.  Jumps off 6 curb w/emphasis on soft landing w/BLEs, x15 reps. Noted pt landing more on LLE and w/staggered stance, so min cues for equal foot placement. Also cued to keep weight in heels rather than forefeet, which pt challenged with.  Box jumps to 6 curb, x15 reps, for improved high amplitude movement and BLE strength. No instability or pain noted.  Jump offs w/BLEs followed by single leg hops to either side over 4 beam, x8 reps, for improved single leg stability and set switching. Pt very challenged by this, especially on R side, requiring CGA-min A.  Single leg jump offs, x5 reps per side, for improved glute med stability. Pt more challenged on RLE, requiring compensated trendelenburg to  stabilize. CGA throughout. No knee pain reported following jumps  Seated clamshells while stabilizing foam roller in between knees, x10 reps bilaterally and x10 reps single leg, for improved hip adductor and glute med strength. Noted decreased IR ROM on LLE > RLE Plank holds w/hands on bosu (black side) 5 x10s holds, for improved functional core stability. Mod multimodal cues for proper form, as pt letting her hips sink down into table due to poor core engagement.    PATIENT EDUCATION:  Education details: Continue HEP  Person educated: Patient Education method: Explanation Education comprehension: verbalized understanding  HOME EXERCISE PROGRAM: Access Code: [email or web address] URL: [email or web address] Date: 03/07/2023 Prepared by: Marlon Luvern Mischke  Exercises - Active Straight Leg Raise with Quad Set  - 1 x daily - 7 x weekly - 3 sets - 10 reps - Single Leg Bridge  - 1 x daily - 7 x weekly - 3 sets - 10 reps - Marching with Resistance  - 1 x daily - 7 x weekly - 3 sets -  10 reps - Backward Monster Walks  - 1 x daily - 7 x weekly - 3 sets - 10 reps - Forward Monster Walks  - 1 x daily - 7 x weekly - 3 sets - 10 reps - Side Stepping with Resistance at Feet  - 1 x daily - 7 x weekly - 3 sets - 10 reps  Dynamic Warmup 2 x 10 feet High knees Glute kicks Toe walking Heel Walking Tin man [employer] and round kicks slow with mild strength to bag at home 3 x 5-10 sets without pain   ASSESSMENT:  CLINICAL IMPRESSION: Emphasis of skilled PT session on stability w/high amplitude movement, single leg stability and glute med strength. Pt tolerated session well w/no report of knee pain. Pt continues to demonstrate more instability on RLE due to functional hip weakness, but was able to control her genu valgus positioning more efficiently without cues, meeting her STG. Continue POC.     OBJECTIVE IMPAIRMENTS: decreased activity tolerance, decreased endurance, decreased  mobility, difficulty walking, decreased strength, and pain.   ACTIVITY LIMITATIONS: bending, sitting, standing, squatting, stairs, and locomotion level  PARTICIPATION LIMITATIONS: shopping, community activity, occupation, and yard work  PERSONAL FACTORS: Fitness are also affecting patient's functional outcome.   REHAB POTENTIAL: Good  CLINICAL DECISION MAKING: Stable/uncomplicated  EVALUATION COMPLEXITY: Low   GOALS: Goals reviewed with patient? Yes  SHORT TERM GOALS: Target date: 03/24/2023  Pt will be independent with initial HEP for improved strength and pain management  Baseline: Goal status: MET  LONG TERM GOALS: Target date: 04/21/2023 (updated to match cert date)   Pt will return to [place] for improved strength and return to PLOF  Baseline: Pt has not returned since May  04/18/23: Pt has not returned due to scheduling, not due to pain  Goal status: NOT MET   2.  Pt will improve score on LEFS to >/= 68/80 for reduced pain levels and improved functional mobility Baseline: 58/80 04/18/23: 73/80  Goal status: MET   NEW SHORT TERM GOALS FOR UPDATED POC:   Target date: 05/16/2023  Pt will demonstrate and verbalize ability to self-correct genu valgus position with single leg tasks and lifts for reduced knee pain and proper body mechanics  Baseline: min-mod multimodal cues from therapist , min cues for mechanics  Goal status: MET   NEW LONG TERM GOALS FOR UPDATED POC:  Target date: 05/30/2023  Pt will be compliant with at-home exercise program (lifting/pilates/etc.) for improved hip strength and reduced pain levels  Baseline: Pt unable to go to [place] due to kid's schedules, reports minimal compliance this week Goal status: IN PROGRESS    PLAN:  PT FREQUENCY: 1x/week  PT DURATION: 6 weeks + 6 weeks (recert)  PLANNED INTERVENTIONS: 02835- PT Re-evaluation, 97110-Therapeutic exercises, 97530- Therapeutic activity, [id number]- Neuromuscular re-education, 97535-  Self Care, 02859- Manual therapy, [id number]- Gait training, [id number]- Aquatic Therapy, [phone]- Electrical stimulation (manual), Balance training, Stair training, Taping, and Dry Needling  PLAN FOR NEXT SESSION: Goals and DC  Damyah Gugel E Jeslie Lowe, PT, DPT 05/23/2023, 10:51 AM

## 2023-05-24 ENCOUNTER — Encounter: Payer: Self-pay | Admitting: Internal Medicine

## 2023-05-25 ENCOUNTER — Ambulatory Visit
Admission: RE | Admit: 2023-05-25 | Discharge: 2023-05-25 | Disposition: A | Discharge status: Home / Self Care | Payer: Self-pay | Source: Ambulatory Visit | Attending: Internal Medicine | Admitting: Internal Medicine

## 2023-05-25 DIAGNOSIS — R519 Headache, unspecified: Secondary | ICD-10-CM

## 2023-05-30 ENCOUNTER — Ambulatory Visit: Payer: 59 | Admitting: Physical Therapy

## 2023-05-30 ENCOUNTER — Ambulatory Visit: Payer: 59

## 2023-05-30 VITALS — BP 122/84 | HR 67 | Temp 98.1°F | Ht 64.0 in | Wt 161.0 lb

## 2023-05-30 DIAGNOSIS — I129 Hypertensive chronic kidney disease with stage 1 through stage 4 chronic kidney disease, or unspecified chronic kidney disease: Secondary | ICD-10-CM

## 2023-05-30 DIAGNOSIS — M6281 Muscle weakness (generalized): Secondary | ICD-10-CM | POA: Diagnosis not present

## 2023-05-30 DIAGNOSIS — G8929 Other chronic pain: Secondary | ICD-10-CM

## 2023-05-30 DIAGNOSIS — R2689 Other abnormalities of gait and mobility: Secondary | ICD-10-CM

## 2023-05-30 NOTE — Therapy (Signed)
OUTPATIENT PHYSICAL THERAPY LOWER EXTREMITY TREATMENT - DISCHARGE SUMMARY    Patient Name: Shelby Campbell MRN: [id number] DOB:[birth year], 48 y.o., female Today's Date: 05/30/2023  PHYSICAL THERAPY DISCHARGE SUMMARY  Visits from Start of Care: 13  Current functional level related to goals / functional outcomes: Independent w/all ADLs    Remaining deficits: Mild low back and knee pain    Education / Equipment: HEP    Patient agrees to discharge. Patient goals were met. Patient is being discharged due to meeting the stated rehab goals.   END OF SESSION:  PT End of Session - 05/30/23 0851     Visit Number 13    Number of Visits 13    Date for PT Re-Evaluation 06/13/23    Authorization Type BCBS    PT Start Time 0848    PT Stop Time 0902   DC   PT Time Calculation (min) 14 min    Activity Tolerance Patient tolerated treatment well    Behavior During Therapy [facility] for tasks assessed/performed              Past Medical History:  Diagnosis Date   Asthma    Chronic kidney disease    stage 3 a per lov dr Lawson Fiscal foster 10-01-2019 on chart   Hiatal hernia    History of mitral valve prolapse    no problems since birth of son 13 yrs ago (2008)   Hydronephrosis 2021   Per patient   Hypertension    Porphyria (HCC)    Porphyria (HCC)    Shortness of breath    related to exertion   Past Surgical History:  Procedure Laterality Date   AXILLARY LYMPH NODE BIOPSY  2010   right    CHOLECYSTECTOMY     CYSTOSCOPY WITH RETROGRADE PYELOGRAM, URETEROSCOPY AND STENT PLACEMENT Right 10/15/2019   Procedure: CYSTOSCOPY WITH RETROGRADE PYELOGRAM, URETEROSCOPY AND STENT PLACEMENT, attempted laser of stricture;  Surgeon: Noel Christmas, MD;  Location: [facility];  Service: Urology;  Laterality: Right;  1 HR   DILITATION & CURRETTAGE/HYSTROSCOPY WITH NOVASURE ABLATION N/A 05/04/2016   Procedure: DILATATION & CURETTAGE/HYSTEROSCOPY WITH NOVASURE ABLATION;  Surgeon: Maxie Better, MD;  Location: [facility];  Service: Gynecology;  Laterality: N/A;   LAPAROSCOPIC CHOLECYSTECTOMY SINGLE PORT N/A 09/10/2013   Procedure: LAPAROSCOPIC CHOLECYSTECTOMY SINGLE SITE WITH CHOLANGIOGRAM;  Surgeon: Ardeth Sportsman, MD;  Location: [facility];  Service: General;  Laterality: N/A;   TUBAL LIGATION     Patient Active Problem List   Diagnosis Date Noted   Frontal headache 05/14/2023   Encounter for general adult medical examination w/o abnormal findings 05/11/2023   BMI 27.0-27.9,adult 10/18/2021   Body mass index (BMI) of 25.0 to 25.9 in adult 10/12/2020   Subconjunctival hemorrhage of right eye 10/12/2020   Chronic renal disease, stage III (HCC) 04/11/2018   Asthma 03/01/2018   Benign hypertension with chronic kidney disease 02/26/2018   Chronic gastritis, mild.  H Pylori negative 07/31/2013   Chronic cholecystitis with calculus 07/31/2013    PCP: Dorothyann Peng, MD  REFERRING PROVIDER: Pollyann Savoy, MD  REFERRING DIAG: M25.561,M25.562,G89.29 (ICD-10-CM) - Chronic pain of both knees  THERAPY DIAG:  Muscle weakness (generalized)  Other abnormalities of gait and mobility  Chronic pain of both knees  Rationale for Evaluation and Treatment: Rehabilitation  ONSET DATE: 02/22/2023 (referral)   SUBJECTIVE:   SUBJECTIVE STATEMENT: Patient reports doing well. Is a bit tight today due to moving furniture in her home this weekend, but no  increase in pain.    PERTINENT HISTORY: osteoarthritis and positive ANA PAIN:  Are you having pain? Yes: NPRS scale: 2/10 Pain location: knees and low back Pain description: nagging Aggravating factors: picking up stuff Relieving factors: none, sometimes stretching  PRECAUTIONS: Fall  RED FLAGS: None   WEIGHT BEARING RESTRICTIONS: No  FALLS:  Has patient fallen in last 6 months? Yes. Number of falls 1 (fell while sparring)  LIVING ENVIRONMENT: Lives with: lives with their family and lives with their spouse Lives in:  House/apartment Stairs: Yes: Internal: 16 steps; on right going up and External: 2 in garage, 1 in front steps; none Has following equipment at home: Single point cane, Walker - 2 wheeled, and Grab bars  OCCUPATION: Engineer   PLOF: Independent  PATIENT GOALS: "to relieve the constant pain"    OBJECTIVE:  Note: Objective measures were completed at Evaluation unless otherwise noted.  DIAGNOSTIC FINDINGS: X-ray of L knee from 10/24   No medial or lateral compartment narrowing was noted.  No patellofemoral narrowing was noted.   Impression: Unremarkable x-rays of the knee.   PATIENT SURVEYS:  LEFS 58/80 (mild to moderate functional limitation)  COGNITION: Overall cognitive status: Within functional limits for tasks assessed     SENSATION: WFL  POSTURE: No Significant postural limitations  PALPATION: No TTP along patella, patellar tendon or joint line   LOWER EXTREMITY MMT: Tested in seated position  MMT Right eval Left eval  Hip flexion 5 5  Hip extension    Hip abduction 5 5  Hip adduction 5 5  Hip internal rotation    Hip external rotation    Knee flexion 5 5  Knee extension 5 4-  Ankle dorsiflexion 5 5  Ankle plantarflexion    Ankle inversion    Ankle eversion     (Blank rows = not tested)  LOWER EXTREMITY SPECIAL TESTS:  Knee special tests: Patella tap test (ballotable patella): negative  FUNCTIONAL TESTS:  SLS: >10s bilaterally, decreased stability on LLE w/compensated trendelenburg    TODAY'S TREATMENT:      Ther Act  LTG Assessment  Pt reports she has not been as compliant w/HEP as she should be but has been walking consistently and working on her stretches. Pt reports she feels good w/HEP and does not want to change it.  Educated pt on how to obtain new referral to PT in future if mobility needs change.   Ther Ex Child's pose to cobra flow, x12 reps, for improved global mobility. No pain reported with activity  Single leg squats, x6 reps per  side, for improved glute med and quad strength. Pt w/significant improvement in single leg stability w/no genu valgus noted. Increased difficulty on LLE > RLE    PATIENT EDUCATION:  Education details: See ther act  Person educated: Patient Education method: Explanation Education comprehension: verbalized understanding  HOME EXERCISE PROGRAM: Access Code: [email or web address] URL: [email or web address] Date: 03/07/2023 Prepared by: Alethia Berthold Garron Eline  Exercises - Active Straight Leg Raise with Quad Set  - 1 x daily - 7 x weekly - 3 sets - 10 reps - Single Leg Bridge  - 1 x daily - 7 x weekly - 3 sets - 10 reps - Marching with Resistance  - 1 x daily - 7 x weekly - 3 sets - 10 reps - Backward Monster Walks  - 1 x daily - 7 x weekly - 3 sets - 10 reps - Forward Monster Walks  - 1 x daily - 7 x weekly -  3 sets - 10 reps - Side Stepping with Resistance at Feet  - 1 x daily - 7 x weekly - 3 sets - 10 reps  Dynamic Warmup 2 x 10 feet High knees Glute kicks Toe walking Heel Walking Tin man [employer] and round kicks slow with mild strength to bag at home 3 x 5-10 sets without pain   Child's pose to cobra flow   ASSESSMENT:  CLINICAL IMPRESSION: Emphasis of skilled PT session on LTG assessment and DC from PT. Pt has met her 1 LTG, performing her HEP independently, although not as consistent as she would like to be. Pt has significantly improved her glute med strength, as not by reduced genu valgus positioning of BLEs and reduced pain levels w/intense activity. Pt in agreement to DC this date due to satisfaction w/current functional level and reduced pain.    OBJECTIVE IMPAIRMENTS: decreased activity tolerance, decreased endurance, decreased mobility, difficulty walking, decreased strength, and pain.   ACTIVITY LIMITATIONS: bending, sitting, standing, squatting, stairs, and locomotion level  PARTICIPATION LIMITATIONS: shopping, community activity, occupation, and yard  work  PERSONAL FACTORS: Fitness are also affecting patient's functional outcome.   REHAB POTENTIAL: Good  CLINICAL DECISION MAKING: Stable/uncomplicated  EVALUATION COMPLEXITY: Low   GOALS: Goals reviewed with patient? Yes  SHORT TERM GOALS: Target date: 03/24/2023  Pt will be independent with initial HEP for improved strength and pain management  Baseline: Goal status: MET  LONG TERM GOALS: Target date: 04/21/2023 (updated to match cert date)   Pt will return to [place] for improved strength and return to PLOF  Baseline: Pt has not returned since May  04/18/23: Pt has not returned due to scheduling, not due to pain  Goal status: NOT MET   2.  Pt will improve score on LEFS to >/= 68/80 for reduced pain levels and improved functional mobility Baseline: 58/80 04/18/23: 73/80  Goal status: MET   NEW SHORT TERM GOALS FOR UPDATED POC:   Target date: 05/16/2023  Pt will demonstrate and verbalize ability to self-correct genu valgus position with single leg tasks and lifts for reduced knee pain and proper body mechanics  Baseline: min-mod multimodal cues from therapist , min cues for mechanics  Goal status: MET   NEW LONG TERM GOALS FOR UPDATED POC:  Target date: 05/30/2023  Pt will be compliant with at-home exercise program (lifting/pilates/etc.) for improved hip strength and reduced pain levels  Baseline: Pt unable to go to [place] due to kid's schedules, reports minimal compliance this week Goal status: MET    PLAN:  PT FREQUENCY: 1x/week  PT DURATION: 6 weeks + 6 weeks (recert)  PLANNED INTERVENTIONS: 69629- PT Re-evaluation, 97110-Therapeutic exercises, 97530- Therapeutic activity, 97112- Neuromuscular re-education, 97535- Self Care, 52841- Manual therapy, [id number]- Gait training, [phone]- Aquatic Therapy, [phone]- Electrical stimulation (manual), Balance training, Stair training, Taping, and Dry Needling   Mitsue Peery E Irene Mitcham, PT, DPT 05/30/2023, 9:12 AM

## 2023-05-30 NOTE — Progress Notes (Signed)
Patient presents today for bpc. She currently takes Amlodipine 2.5 she takes at night & Metoprolol Succinate 50MG  she takes in the morning. Denies headache, chest pain & sob. She admits not drinking enough water daily.  BP Readings from Last 3 Encounters:  05/30/23 122/84  05/11/23 126/84  05/09/23 132/81  Per provider patient is to continue with current regimen. Patient aware.

## 2023-06-29 ENCOUNTER — Other Ambulatory Visit: Payer: Self-pay | Admitting: Internal Medicine

## 2023-08-09 NOTE — Progress Notes (Unsigned)
 Office Visit Note  Patient: Shelby Campbell             Date of Birth: 1975-10-05           MRN: [id number]             PCP: Cleave Curling, MD Referring: Cleave Curling, MD Visit Date: 08/23/2023 Occupation: @GUAROCC @  Subjective:  Routine follow-up  History of Present Illness: Shelby Campbell is a 48 y.o. female wth history of positive ANA and osteoarthritis.  Patient was last seen in the office on 02/22/2023.  She denies any new or worsening symptoms since her last office visit.  Patient states that she completed physical therapy which improved the strength in her lower extremities as well as the aching sensation in both knees.  She has not noticed any warmth or swelling in her knee joints.  Overall her symptoms have been more manageable.  Patient states that the pain in her left index finger is also improved and she is able to fully extend the digit now. She denies any recent rashes, hair loss, oral or nasal ulcerations.  She has not had any recent symptoms of Raynaud's phenomenon.  She denies any skin tightness or thickening. She denies any increased fatigue.  Activities of Daily Living:  Patient reports morning stiffness for 0 minutes  Patient Denies nocturnal pain.  Difficulty dressing/grooming: Denies Difficulty climbing stairs: Denies Difficulty getting out of chair: Denies Difficulty using hands for taps, buttons, cutlery, and/or writing: Denies  Review of Systems  Constitutional:  Negative for fatigue.  HENT:  Negative for mouth sores, mouth dryness and nose dryness.   Eyes:  Negative for pain, visual disturbance and dryness.  Respiratory:  Negative for cough, hemoptysis, shortness of breath and difficulty breathing.   Cardiovascular:  Negative for chest pain, palpitations, hypertension and swelling in legs/feet.  Gastrointestinal:  Negative for blood in stool, constipation and diarrhea.  Endocrine: Negative for increased urination.  Genitourinary:  Negative for painful  urination and involuntary urination.  Musculoskeletal:  Negative for joint pain, gait problem, joint pain, joint swelling, myalgias, muscle weakness, morning stiffness, muscle tenderness and myalgias.  Skin: Negative.  Negative for color change, pallor, rash, hair loss, nodules/bumps, skin tightness, ulcers and sensitivity to sunlight.  Allergic/Immunologic: Negative.  Negative for susceptible to infections.  Neurological: Negative.  Negative for dizziness, numbness, headaches and weakness.  Hematological: Negative.  Negative for swollen glands.  Psychiatric/Behavioral: Negative.  Negative for depressed mood and sleep disturbance. The patient is not nervous/anxious.     PMFS History:  Patient Active Problem Campbell   Diagnosis Date Noted   Frontal headache 05/14/2023   Encounter for general adult medical examination w/o abnormal findings 05/11/2023   BMI 27.0-27.9,adult 10/18/2021   Body mass index (BMI) of 25.0 to 25.9 in adult 10/12/2020   Subconjunctival hemorrhage of right eye 10/12/2020   Chronic renal disease, stage III (HCC) 04/11/2018   Asthma 03/01/2018   Benign hypertension with chronic kidney disease 02/26/2018   Chronic gastritis, mild.  H Pylori negative 07/31/2013   Chronic cholecystitis with calculus 07/31/2013    Past Medical History:  Diagnosis Date   Asthma    Chronic kidney disease    stage 3 a per lov dr Avanell Bob foster 10-01-2019 on chart   Hiatal hernia    History of mitral valve prolapse    no problems since birth of son 13 yrs ago (2008)   Hydronephrosis 2021   Per patient   Hypertension  Porphyria (HCC)    Porphyria (HCC)    Shortness of breath    related to exertion    Family History  Problem Relation Age of Onset   Hypertension Mother    Arthritis Mother    Hypertension Father    Prostate cancer Father    Arthritis Father    Gout Father    Healthy Son    Healthy Daughter    Past Surgical History:  Procedure Laterality Date   AXILLARY LYMPH NODE  BIOPSY  2010   right    CHOLECYSTECTOMY     CYSTOSCOPY WITH RETROGRADE PYELOGRAM, URETEROSCOPY AND STENT PLACEMENT Right 10/15/2019   Procedure: CYSTOSCOPY WITH RETROGRADE PYELOGRAM, URETEROSCOPY AND STENT PLACEMENT, attempted laser of stricture;  Surgeon: Roxane Copp, MD;  Location: [facility];  Service: Urology;  Laterality: Right;  1 HR   DILITATION & CURRETTAGE/HYSTROSCOPY WITH NOVASURE ABLATION N/A 05/04/2016   Procedure: DILATATION & CURETTAGE/HYSTEROSCOPY WITH NOVASURE ABLATION;  Surgeon: Ivery Marking, MD;  Location: [facility];  Service: Gynecology;  Laterality: N/A;   LAPAROSCOPIC CHOLECYSTECTOMY SINGLE PORT N/A 09/10/2013   Procedure: LAPAROSCOPIC CHOLECYSTECTOMY SINGLE SITE WITH CHOLANGIOGRAM;  Surgeon: Eddye Goodie, MD;  Location: [facility];  Service: General;  Laterality: N/A;   TUBAL LIGATION     Social History   Social History Narrative   Not on file   Immunization History  Administered Date(s) Administered   Influenza,inj,Quad PF,6+ Mos 03/01/2018, 04/08/2020, 03/27/2021   Influenza-Unspecified 03/28/2022   Janssen (J&J) SARS-COV-2 Vaccination 08/08/2019   Moderna Sars-Covid-2 Vaccination 04/01/2020   Pfizer Covid-19 Vaccine Bivalent Booster 72yrs & up 03/27/2021   Td 04/12/2003   Tdap 12/20/2010, 04/13/2021     Objective: Vital Signs: BP 130/85   Pulse 60   Resp 16   Ht 5\' 4"  (1.626 m)   Wt 166 lb 9.6 oz (75.6 kg)   BMI 28.60 kg/m    Physical Exam Vitals and nursing note reviewed.  Constitutional:      Appearance: She is well-developed.  HENT:     Head: Normocephalic and atraumatic.  Eyes:     Conjunctiva/sclera: Conjunctivae normal.  Cardiovascular:     Rate and Rhythm: Normal rate and regular rhythm.     Heart sounds: Normal heart sounds.  Pulmonary:     Effort: Pulmonary effort is normal.     Breath sounds: Normal breath sounds.  Abdominal:     General: Bowel sounds are normal.     Palpations: Abdomen is soft.   Musculoskeletal:     Cervical back: Normal range of motion.  Lymphadenopathy:     Cervical: No cervical adenopathy.  Skin:    General: Skin is warm and dry.     Capillary Refill: Capillary refill takes less than 2 seconds.  Neurological:     Mental Status: She is alert and oriented to person, place, and time.  Psychiatric:        Behavior: Behavior normal.      Musculoskeletal Exam: C-spine, thoracic spine, lumbar spine have good range of motion.  Shoulder joints, elbow joints, wrist joints, MCPs, PIPs, DIPs have good range of motion with no synovitis.  Complete fist formation bilaterally.  Hip joints have good range of motion with no groin pain.  Knee joints have good range of motion with no warmth or effusion.  Ankle joints have good range of motion with no tenderness or joint swelling.  CDAI Exam: CDAI Score: -- Patient Global: --; Provider Global: -- Swollen: --; Tender: -- Joint Exam  08/23/2023   No joint exam has been documented for this visit   There is currently no information documented on the homunculus. Go to the Rheumatology activity and complete the homunculus joint exam.  Investigation: No additional findings.  Imaging: No results found.  Recent Labs: Lab Results  Component Value Date   WBC 4.7 05/11/2023   HGB 13.0 05/11/2023   PLT 265 05/11/2023   NA 141 02/22/2023   K 4.0 02/22/2023   CL 105 02/22/2023   CO2 27 02/22/2023   GLUCOSE 62 (L) 02/22/2023   BUN 29 (H) 02/22/2023   CREATININE 1.81 (H) 02/22/2023   BILITOT 0.5 05/11/2023   ALKPHOS 61 05/11/2023   AST 24 05/11/2023   ALT 16 05/11/2023   PROT 7.2 05/11/2023   ALBUMIN 4.3 05/11/2023   CALCIUM 9.8 02/22/2023   GFRAA 46 05/13/2021    Speciality Comments: No specialty comments available.  Procedures:  No procedures performed Allergies: Nsaids, Other, and Sulfa antibiotics   Assessment / Plan:     Visit Diagnoses: Positive ANA (antinuclear antibody) - ANA 1: 80 speckled, positive RNP,   family history of systemic lupus and rheumatoid arthritis: She has no clinical features of systemic lupus at this time.  She has not noticed any new or worsening symptoms since her last office visit on 02/22/2023.  She has not had any recent rashes, symptoms of Raynaud's phenomenon, hair loss, oral or nasal ulcerations, sicca symptoms, or increased fatigue.  She has not noticed any unintentional weight loss.  No photosensitivity.  She has not noticed any skin tightness or thickening.  No new medical conditions.  She had no synovitis on examination today. Capillary refill is less than 2 seconds.  No signs of sclerodactyly noted.  No Malar rash noted. Reviewed lab results from 02/22/2023: ANA 1: 80 NH, RNP 1.9, ESR within normal limits, complements within normal limits, double-stranded DNA negative, and urine protein creatinine ratio within normal limits.  Offered to update the following lab work today since she has not developed any new or worsening symptoms since her last office visit she would like to hold off on lab work to her follow-up visit.  If she develops any new or worsening symptoms she can return sooner for updated lab work.  Future orders will be placed today.  She will follow-up in the office in 6 months or sooner if needed.  Chronic pain of both knees - She has noticed a significant improvement in her discomfort and stiffness in both knees since completing physical therapy.  She has not had any difficulty rising from a seated position or climbing steps recently.  On examination today she has good range of motion of both knee joints with no warmth or effusion.  Primary osteoarthritis of both feet: She is not experiencing any increased discomfort in her feet at this time.  She is good range of motion of both ankle joints with no tenderness or joint swelling.  She is wearing proper fitting shoes.  Other medical conditions are listed as follows:  Family history of rheumatoid arthritis -  Mother  Family history of systemic lupus erythematosus - Cousin  Stage 3 chronic kidney disease, unspecified whether stage 3a or 3b CKD (HCC)  Mild intermittent asthma without complication  Benign hypertension with chronic kidney disease: Blood pressure was 130/85 today in the office.  History of gastritis  Orders: Orders Placed This Encounter  Procedures   Protein / creatinine ratio, urine   CBC with Differential/Platelet   Anti-DNA antibody,  double-stranded   ANA   Sedimentation rate   C3 and C4   Comprehensive metabolic panel with GFR   RNP Antibody   No orders of the defined types were placed in this encounter.   Follow-Up Instructions: Return in about 6 months (around 02/22/2024) for +ANA, Osteoarthritis.   Romayne Clubs, PA-C  Note - This record has been created using Dragon software.  Chart creation errors have been sought, but may not always  have been located. Such creation errors do not reflect on  the standard of medical care.

## 2023-08-23 ENCOUNTER — Encounter: Payer: Self-pay | Admitting: Physician Assistant

## 2023-08-23 ENCOUNTER — Ambulatory Visit: Payer: BC Managed Care – PPO | Attending: Physician Assistant | Admitting: Physician Assistant

## 2023-08-23 VITALS — BP 130/85 | HR 60 | Resp 16 | Ht 64.0 in | Wt 166.6 lb

## 2023-08-23 DIAGNOSIS — N183 Chronic kidney disease, stage 3 unspecified: Secondary | ICD-10-CM

## 2023-08-23 DIAGNOSIS — M19071 Primary osteoarthritis, right ankle and foot: Secondary | ICD-10-CM

## 2023-08-23 DIAGNOSIS — Z8719 Personal history of other diseases of the digestive system: Secondary | ICD-10-CM

## 2023-08-23 DIAGNOSIS — M25561 Pain in right knee: Secondary | ICD-10-CM

## 2023-08-23 DIAGNOSIS — I129 Hypertensive chronic kidney disease with stage 1 through stage 4 chronic kidney disease, or unspecified chronic kidney disease: Secondary | ICD-10-CM

## 2023-08-23 DIAGNOSIS — Z8261 Family history of arthritis: Secondary | ICD-10-CM

## 2023-08-23 DIAGNOSIS — G8929 Other chronic pain: Secondary | ICD-10-CM

## 2023-08-23 DIAGNOSIS — M25562 Pain in left knee: Secondary | ICD-10-CM

## 2023-08-23 DIAGNOSIS — R768 Other specified abnormal immunological findings in serum: Secondary | ICD-10-CM

## 2023-08-23 DIAGNOSIS — J4520 Mild intermittent asthma, uncomplicated: Secondary | ICD-10-CM

## 2023-08-23 DIAGNOSIS — Z8269 Family history of other diseases of the musculoskeletal system and connective tissue: Secondary | ICD-10-CM

## 2023-08-23 DIAGNOSIS — M19072 Primary osteoarthritis, left ankle and foot: Secondary | ICD-10-CM

## 2023-10-26 ENCOUNTER — Other Ambulatory Visit: Payer: Self-pay | Admitting: Nephrology

## 2023-10-26 DIAGNOSIS — N1832 Chronic kidney disease, stage 3b: Secondary | ICD-10-CM

## 2023-10-26 DIAGNOSIS — I129 Hypertensive chronic kidney disease with stage 1 through stage 4 chronic kidney disease, or unspecified chronic kidney disease: Secondary | ICD-10-CM

## 2023-10-26 DIAGNOSIS — N1330 Unspecified hydronephrosis: Secondary | ICD-10-CM

## 2023-10-31 ENCOUNTER — Ambulatory Visit
Admission: RE | Admit: 2023-10-31 | Discharge: 2023-10-31 | Disposition: A | Discharge status: Home / Self Care | Source: Ambulatory Visit | Attending: Nephrology | Admitting: Nephrology

## 2023-10-31 DIAGNOSIS — I129 Hypertensive chronic kidney disease with stage 1 through stage 4 chronic kidney disease, or unspecified chronic kidney disease: Secondary | ICD-10-CM

## 2023-10-31 DIAGNOSIS — N1832 Chronic kidney disease, stage 3b: Secondary | ICD-10-CM

## 2023-10-31 DIAGNOSIS — N1330 Unspecified hydronephrosis: Secondary | ICD-10-CM

## 2023-11-04 ENCOUNTER — Other Ambulatory Visit: Payer: Self-pay | Admitting: Internal Medicine

## 2023-11-04 DIAGNOSIS — I129 Hypertensive chronic kidney disease with stage 1 through stage 4 chronic kidney disease, or unspecified chronic kidney disease: Secondary | ICD-10-CM

## 2023-11-08 ENCOUNTER — Ambulatory Visit: Payer: Self-pay | Admitting: Internal Medicine

## 2023-11-08 ENCOUNTER — Encounter: Payer: Self-pay | Admitting: Internal Medicine

## 2023-11-08 VITALS — BP 110/80 | HR 60 | Temp 98.5°F | Ht 64.0 in | Wt 169.4 lb

## 2023-11-08 DIAGNOSIS — J4520 Mild intermittent asthma, uncomplicated: Secondary | ICD-10-CM | POA: Diagnosis not present

## 2023-11-08 DIAGNOSIS — I129 Hypertensive chronic kidney disease with stage 1 through stage 4 chronic kidney disease, or unspecified chronic kidney disease: Secondary | ICD-10-CM

## 2023-11-08 DIAGNOSIS — I341 Nonrheumatic mitral (valve) prolapse: Secondary | ICD-10-CM | POA: Diagnosis not present

## 2023-11-08 DIAGNOSIS — Z8249 Family history of ischemic heart disease and other diseases of the circulatory system: Secondary | ICD-10-CM | POA: Insufficient documentation

## 2023-11-08 DIAGNOSIS — N1831 Chronic kidney disease, stage 3a: Secondary | ICD-10-CM | POA: Diagnosis not present

## 2023-11-08 MED ORDER — FLUTICASONE FUROATE-VILANTEROL 200-25 MCG/ACT IN AEPB: 1.0000 | INHALATION_SPRAY | Freq: Every day | RESPIRATORY_TRACT | 2 refills | Status: DC

## 2023-11-08 NOTE — Patient Instructions (Signed)
 Hypertension, Adult Hypertension is another name for high blood pressure. High blood pressure forces your heart to work harder to pump blood. This can cause problems over time. There are two numbers in a blood pressure reading. There is a top number (systolic) over a bottom number (diastolic). It is best to have a blood pressure that is below 120/80. What are the causes? The cause of this condition is not known. Some other conditions can lead to high blood pressure. What increases the risk? Some lifestyle factors can make you more likely to develop high blood pressure: Smoking. Not getting enough exercise or physical activity. Being overweight. Having too much fat, sugar, calories, or salt (sodium) in your diet. Drinking too much alcohol. Other risk factors include: Having any of these conditions: Heart disease. Diabetes. High cholesterol. Kidney disease. Obstructive sleep apnea. Having a family history of high blood pressure and high cholesterol. Age. The risk increases with age. Stress. What are the signs or symptoms? High blood pressure may not cause symptoms. Very high blood pressure (hypertensive crisis) may cause: Headache. Fast or uneven heartbeats (palpitations). Shortness of breath. Nosebleed. Vomiting or feeling like you may vomit (nauseous). Changes in how you see. Very bad chest pain. Feeling dizzy. Seizures. How is this treated? This condition is treated by making healthy lifestyle changes, such as: Eating healthy foods. Exercising more. Drinking less alcohol. Your doctor may prescribe medicine if lifestyle changes do not help enough and if: Your top number is above 130. Your bottom number is above 80. Your personal target blood pressure may vary. Follow these instructions at home: Eating and drinking  If told, follow the DASH eating plan. To follow this plan: Fill one half of your plate at each meal with fruits and vegetables. Fill one fourth of your plate  at each meal with whole grains. Whole grains include whole-wheat pasta, brown rice, and whole-grain bread. Eat or drink low-fat dairy products, such as skim milk or low-fat yogurt. Fill one fourth of your plate at each meal with low-fat (lean) proteins. Low-fat proteins include fish, chicken without skin, eggs, beans, and tofu. Avoid fatty meat, cured and processed meat, or chicken with skin. Avoid pre-made or processed food. Limit the amount of salt in your diet to less than 1,500 mg each day. Do not drink alcohol if: Your doctor tells you not to drink. You are pregnant, may be pregnant, or are planning to become pregnant. If you drink alcohol: Limit how much you have to: 0-1 drink a day for women. 0-2 drinks a day for men. Know how much alcohol is in your drink. In the [place] one drink equals one 12 oz bottle of beer (355 mL), one 5 oz glass of wine (148 mL), or one 1 oz glass of hard liquor (44 mL). Lifestyle  Work with your doctor to stay at a healthy weight or to lose weight. Ask your doctor what the best weight is for you. Get at least 30 minutes of exercise that causes your heart to beat faster (aerobic exercise) most days of the week. This may include walking, swimming, or biking. Get at least 30 minutes of exercise that strengthens your muscles (resistance exercise) at least 3 days a week. This may include lifting weights or doing Pilates. Do not smoke or use any products that contain nicotine or tobacco. If you need help quitting, ask your doctor. Check your blood pressure at home as told by your doctor. Keep all follow-up visits. Medicines Take over-the-counter and prescription medicines  only as told by your doctor. Follow directions carefully. Do not skip doses of blood pressure medicine. The medicine does not work as well if you skip doses. Skipping doses also puts you at risk for problems. Ask your doctor about side effects or reactions to medicines that you should watch  for. Contact a doctor if: You think you are having a reaction to the medicine you are taking. You have headaches that keep coming back. You feel dizzy. You have swelling in your ankles. You have trouble with your vision. Get help right away if: You get a very bad headache. You start to feel mixed up (confused). You feel weak or numb. You feel faint. You have very bad pain in your: Chest. Belly (abdomen). You vomit more than once. You have trouble breathing. These symptoms may be an emergency. Get help right away. Call 911. Do not wait to see if the symptoms will go away. Do not drive yourself to the hospital. Summary Hypertension is another name for high blood pressure. High blood pressure forces your heart to work harder to pump blood. For most people, a normal blood pressure is less than 120/80. Making healthy choices can help lower blood pressure. If your blood pressure does not get lower with healthy choices, you may need to take medicine. This information is not intended to replace advice given to you by your health care provider. Make sure you discuss any questions you have with your health care provider. Document Revised: 02/11/2021 Document Reviewed: 02/11/2021 Elsevier Patient Education  2024 [employer].

## 2023-11-08 NOTE — Assessment & Plan Note (Signed)
 Under nephrology care, taking vitamin D  2000 units. 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 Recent labs pending. - Obtain recent lab results from nephrologist. - Chronic, she is encouraged to stay well hydrated, avoid NSAIDs and keep BP controlled to prevent progression of CKD.

## 2023-11-08 NOTE — Assessment & Plan Note (Addendum)
 Chronic, fair control.  Goal BP<120/80.   She will continue with amlodipine 2.5mg  daily and metoprolol  succinate XL 50mg  daily. She is encouraged to follow a low sodium diet. She will f/u in four to six months for re-evaluation. Hypertension managed with amlodipine and metoprolol . Insurance issues delayed amlodipine, critical for blood pressure control due to family history of heart disease. - Ensure adequate supply of metoprolol . - Address insurance issue with amlodipine for continuous supply.

## 2023-11-08 NOTE — Assessment & Plan Note (Signed)
 Asthma exacerbated by humid weather. Using Breo, Singulair , and albuterol  inhaler. Reports nasal congestion and headaches possibly linked to asthma. - Ensure adequate supply of Breo and Singulair . - Advise against outdoor exercise in humid conditions.

## 2023-11-08 NOTE — Assessment & Plan Note (Signed)
 Mitral valve prolapse diagnosed at age 48. Current status uncertain, considering echocardiogram. - Consider referral for echocardiogram.

## 2023-11-08 NOTE — Assessment & Plan Note (Signed)
-   Order genetic testing for heart disease predisposition. - Consider cardiac calcium score if genetic test is elevated.

## 2023-11-08 NOTE — Progress Notes (Signed)
 I,[name], CMA,acting as a Neurosurgeon for [name], MD.,have documented all relevant documentation on the behalf of [name], MD,as directed by  [name], MD while in the presence of [name], MD.  Subjective:  Patient ID: Shelby Campbell , female    DOB: 05/20/1975 , 48 y.o.   MRN: [id number]  Chief Complaint  Patient presents with   Hypertension    HPI Discussed the use of AI scribe software for clinical note transcription with the patient, who gave verbal consent to proceed.  History of Present Illness BRAELYNN BENNING is a 48 year old female with hypertension and asthma who presents for a blood pressure check.  She experiences nasal congestion and headaches when the weather changes, particularly when it rains. The headaches are described as a pressure sensation. Her recent travel to [place] , where the heat and humidity exacerbated her symptoms, made her feel miserable. Despite these challenges, her breathing is generally stable, although she has moments of difficulty, especially in the heat.  She is currently taking amlodipine 2.5 mg for hypertension but faced issues with insurance coverage, leading to a two-week period without the medication. She also takes metoprolol  50 mg, Breo, Singulair , and has an adequate supply of albuterol  inhalers. Additionally, she is on 2000 units of vitamin D  as advised by her kidney specialist due to elevated parathyroid levels.  She has a history of severe hydronephrosis and recently underwent an ultrasound after returning from [place] . She is awaiting a follow-up appointment with her urologist for further evaluation.  There is a significant family history of heart disease, including a cousin who died from a heart attack at age 24, his father who also had heart problems, and another uncle who passed away a year ago from heart-related issues.  She inquires about her history of mitral valve prolapse, diagnosed at age 54. She has not had  an echocardiogram since her pregnancy 16 years ago and is uncertain about the current status of the condition.  She is not currently exercising and acknowledges the need to be more active. She works indoors and is relieved not to have a job that requires being outside in the heat, which could affect her breathing.   Hypertension This is a chronic problem. The current episode started more than 1 year ago. The problem has been gradually improving since onset. The problem is controlled. Pertinent negatives include no blurred vision. Past treatments include ACE inhibitors. The current treatment provides moderate improvement. Hypertensive end-organ damage includes kidney disease.     Past Medical History:  Diagnosis Date   Asthma    Chronic kidney disease    stage 3 a per lov dr katheryn foster 10-01-2019 on chart   Hiatal hernia    History of mitral valve prolapse    no problems since birth of son 13 yrs ago (2008)   Hydronephrosis 2021   Per patient   Hypertension    Porphyria (HCC)    Porphyria (HCC)    Shortness of breath    related to exertion     Family History  Problem Relation Age of Onset   Hypertension Mother    Arthritis Mother    Hypertension Father    Prostate cancer Father    Arthritis Father    Gout Father    Healthy Son    Healthy Daughter      Current Outpatient Medications:    acetaminophen  (TYLENOL ) 500 MG tablet, Take 1,000 mg by mouth 2 (  two) times daily as needed for headache (or flank pain)., Disp: , Rfl:    albuterol  (VENTOLIN  HFA) 108 (90 Base) MCG/ACT inhaler, Inhale 2 puffs into the lungs every 6 (six) hours as needed for wheezing or shortness of breath., Disp: 54 g, Rfl: 3   amLODipine (NORVASC) 2.5 MG tablet, Take 2.5 mg by mouth daily., Disp: , Rfl:    ascorbic acid (VITAMIN C) 250 MG CHEW, 2 chewables Orally Once a day, Disp: , Rfl:    cholecalciferol (VITAMIN D3) 25 MCG (1000 UNIT) tablet, Take 1,000 Units by mouth daily. , Disp: , Rfl:    famotidine   (PEPCID ) 20 MG tablet, TAKE 1 TABLET(20 MG) BY MOUTH DAILY, Disp: 90 tablet, Rfl: 1   metoprolol  succinate (TOPROL -XL) 50 MG 24 hr tablet, TAKE 1 TABLET(50 MG) BY MOUTH DAILY, Disp: 90 tablet, Rfl: 2   montelukast  (SINGULAIR ) 10 MG tablet, TAKE 1 TABLET(10 MG) BY MOUTH DAILY, Disp: 90 tablet, Rfl: 2   Multiple Vitamin (MULTIVITAMIN) tablet, Take 1 tablet by mouth daily., Disp: , Rfl:    Multiple Vitamins-Minerals (HAIR SKIN & NAILS) TABS, 2500 mcg 2 gummies Orally one a day, Disp: , Rfl:    fluticasone  furoate-vilanterol (BREO ELLIPTA ) 200-25 MCG/ACT AEPB, Inhale 1 puff into the lungs daily., Disp: 180 each, Rfl: 2   Allergies  Allergen Reactions   Nsaids     And ibuprofen cannot take due to chronic kidney disease   Other Other (See Comments)    Sulfonamides, sulfonylureas, barbiturates, antifungals, ketamine, etomidate, rifapentine, rifampicin, rifabutine, nitrofurantoin, metronidazole, ergot derivatives, indinavir, nevirapine, ritonavir, saquinavir, progestogens, progesterones, carbamazepine, phenytoin, valproate, oxycodone , pentazocine, cocaine, gold antirheumatics agents, sodium aurothiomalate, methyldopa, fenfluramine, ethosuzimide, flupentixol, flutamide, disulfiram, lidocaine     Sulfa Antibiotics Other (See Comments)    Heart beating fast      Review of Systems  Constitutional: Negative.   Eyes:  Negative for blurred vision.  Respiratory: Negative.    Cardiovascular: Negative.   Gastrointestinal: Negative.   Neurological: Negative.   Psychiatric/Behavioral: Negative.       Today's Vitals   11/08/23 0851  BP: 110/80  Pulse: 60  Temp: 98.5 F (36.9 C)  SpO2: 98%  Weight: 169 lb 6.4 oz (76.8 kg)  Height: 5' 4 (1.626 m)   Body mass index is 29.08 kg/m.  Wt Readings from Last 3 Encounters:  11/08/23 169 lb 6.4 oz (76.8 kg)  08/23/23 166 lb 9.6 oz (75.6 kg)  05/30/23 161 lb (73 kg)     Objective:  Physical Exam Vitals and nursing note reviewed.  Constitutional:       Appearance: Normal appearance.  HENT:     Head: Normocephalic and atraumatic.  Eyes:     Extraocular Movements: Extraocular movements intact.  Cardiovascular:     Rate and Rhythm: Normal rate and regular rhythm.     Heart sounds: Normal heart sounds.  Pulmonary:     Effort: Pulmonary effort is normal.     Breath sounds: Normal breath sounds.  Musculoskeletal:     Cervical back: Normal range of motion.  Skin:    General: Skin is warm.  Neurological:     General: No focal deficit present.     Mental Status: She is alert.  Psychiatric:        Mood and Affect: Mood normal.        Behavior: Behavior normal.         Assessment And Plan:  Benign hypertension with chronic kidney disease Assessment & Plan: Chronic, fair control.  Goal BP<120/80.   She will continue with amlodipine 2.5mg  daily and metoprolol  succinate XL 50mg  daily. She is encouraged to follow a low sodium diet. She will f/u in four to six months for re-evaluation. Hypertension managed with amlodipine and metoprolol . Insurance issues delayed amlodipine, critical for blood pressure control due to family history of heart disease. - Ensure adequate supply of metoprolol . - Address insurance issue with amlodipine for continuous supply.   Orders: -     Lipid panel  Stage 3a chronic kidney disease (HCC) Assessment & Plan: Under nephrology care, taking vitamin D  2000 units. 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 Recent labs pending. - Obtain recent lab results from nephrologist. - Chronic, she is encouraged to stay well hydrated, avoid NSAIDs and keep BP controlled to prevent progression of CKD.     Mild intermittent asthma without complication Assessment & Plan: Asthma exacerbated by humid weather. Using Breo, Singulair , and albuterol  inhaler. Reports nasal congestion and headaches possibly linked to asthma. - Ensure adequate supply of Breo and Singulair . - Advise against outdoor exercise in humid  conditions.  Orders: -     Fluticasone  Furoate-Vilanterol; Inhale 1 puff into the lungs daily.  Dispense: 180 each; Refill: 2  Mitral valve prolapse Assessment & Plan: Mitral valve prolapse diagnosed at age 72. Current status uncertain, considering echocardiogram. - Consider referral for echocardiogram.  Orders: -     ECHOCARDIOGRAM COMPLETE; Future  Family history of heart disease Assessment & Plan: - Order genetic testing for heart disease predisposition. - Consider cardiac calcium score if genetic test is elevated.  Orders: -     Lipoprotein A (LPA)   Return if symptoms worsen or fail to improve.  Patient was given opportunity to ask questions. Patient verbalized understanding of the plan and was able to repeat key elements of the plan. All questions were answered to their satisfaction.   I, [name], MD, have reviewed all documentation for this visit. The documentation on 11/08/23 for the exam, diagnosis, procedures, and orders are all accurate and complete.   IF YOU HAVE BEEN REFERRED TO A SPECIALIST, IT MAY TAKE 1-2 WEEKS TO SCHEDULE/PROCESS THE REFERRAL. IF YOU HAVE NOT HEARD FROM US /SPECIALIST IN TWO WEEKS, PLEASE GIVE US  A CALL AT [phone].   THE PATIENT IS ENCOURAGED TO PRACTICE SOCIAL DISTANCING DUE TO THE COVID-19 PANDEMIC.

## 2023-11-09 LAB — LIPOPROTEIN A (LPA): Lipoprotein (a): 111 nmol/L — ABNORMAL HIGH (ref <75.0)

## 2023-11-09 LAB — LIPID PANEL
Chol/HDL Ratio: 3 ratio (ref 0.0–4.4)
Cholesterol, Total: 182 mg/dL (ref 100–199)
HDL: 61 mg/dL (ref >39)
LDL Chol Calc (NIH): 102 mg/dL — ABNORMAL HIGH (ref 0–99)
Triglycerides: 109 mg/dL (ref 0–149)
VLDL Cholesterol Cal: 19 mg/dL (ref 5–40)

## 2023-11-11 ENCOUNTER — Ambulatory Visit: Payer: Self-pay | Admitting: Internal Medicine

## 2023-12-25 ENCOUNTER — Other Ambulatory Visit: Payer: Self-pay | Admitting: Internal Medicine

## 2023-12-29 ENCOUNTER — Other Ambulatory Visit (HOSPITAL_COMMUNITY): Payer: Self-pay | Admitting: Urology

## 2023-12-29 ENCOUNTER — Encounter (HOSPITAL_COMMUNITY): Payer: Self-pay | Admitting: Urology

## 2023-12-29 DIAGNOSIS — N1330 Unspecified hydronephrosis: Secondary | ICD-10-CM

## 2024-01-01 ENCOUNTER — Ambulatory Visit (HOSPITAL_COMMUNITY)
Admission: RE | Admit: 2024-01-01 | Discharge: 2024-01-01 | Disposition: A | Discharge status: Home / Self Care | Source: Ambulatory Visit | Attending: Cardiology | Admitting: Cardiology

## 2024-01-01 DIAGNOSIS — I341 Nonrheumatic mitral (valve) prolapse: Secondary | ICD-10-CM | POA: Insufficient documentation

## 2024-01-01 LAB — ECHOCARDIOGRAM COMPLETE
Area-P 1/2: 3.81 cm2
S' Lateral: 3 cm

## 2024-01-10 ENCOUNTER — Ambulatory Visit (HOSPITAL_COMMUNITY)
Admission: RE | Admit: 2024-01-10 | Discharge: 2024-01-10 | Disposition: A | Discharge status: Home / Self Care | Source: Ambulatory Visit | Attending: Urology | Admitting: Urology

## 2024-01-10 DIAGNOSIS — N1330 Unspecified hydronephrosis: Secondary | ICD-10-CM | POA: Diagnosis present

## 2024-01-10 MED ORDER — FUROSEMIDE 10 MG/ML IJ SOLN
38.0000 mg | Freq: Once | INTRAMUSCULAR | Status: AC
  Administered 2024-01-10: 38 mg via INTRAVENOUS

## 2024-01-10 MED ORDER — FUROSEMIDE 10 MG/ML IJ SOLN
INTRAMUSCULAR | Status: AC
  Filled 2024-01-10: qty 4

## 2024-01-10 MED ORDER — TECHNETIUM TC 99M MERTIATIDE
5.4900 | Freq: Once | INTRAVENOUS | Status: AC
  Administered 2024-01-10: 5.49 via INTRAVENOUS

## 2024-02-09 NOTE — Progress Notes (Signed)
 Office Visit Note  Patient: Shelby Campbell             Date of Birth: [birth year]           MRN: [id number]             PCP: Jarold Medici, MD Referring: Jarold Medici, MD Visit Date: 02/23/2024 Occupation: Data Unavailable  Subjective:  Intermittent joint pain  History of Present Illness: Shelby Campbell is a 48 y.o. female with osteoarthritis and positive ANA.  She returns today after her last in April 2025.  She reports intermittent stiffness in her knees and her feet.  She states her feet feel stiff in the morning and get better after walking.  She has not noticed any joint swelling.  She denies any history of oral ulcers, nasal ulcers, sicca symptoms, fatigue, malar rash, photosensitivity, Raynaud's phenomenon, lymphadenopathy or inflammatory arthritis.  She reports shortness of breath related to asthma.    Activities of Daily Living:  Patient reports morning stiffness for 15-20 minutes.   Patient Denies nocturnal pain.  Difficulty dressing/grooming: Denies Difficulty climbing stairs: Denies Difficulty getting out of chair: Denies Difficulty using hands for taps, buttons, cutlery, and/or writing: Denies  Review of Systems  Constitutional:  Negative for fatigue.  HENT:  Negative for mouth sores and mouth dryness.   Eyes:  Negative for dryness.  Respiratory:  Positive for shortness of breath.   Cardiovascular:  Negative for chest pain and palpitations.  Gastrointestinal:  Negative for blood in stool, constipation and diarrhea.  Endocrine: Negative for increased urination.  Genitourinary:  Negative for involuntary urination.  Musculoskeletal:  Positive for joint pain, joint pain and morning stiffness. Negative for gait problem, joint swelling, myalgias, muscle weakness, muscle tenderness and myalgias.  Skin:  Negative for color change, rash, hair loss and sensitivity to sunlight.  Allergic/Immunologic: Negative for susceptible to infections.  Neurological:  Positive for  dizziness and headaches.  Hematological:  Negative for swollen glands.  Psychiatric/Behavioral:  Negative for depressed mood and sleep disturbance. The patient is not nervous/anxious.     PMFS History:  Patient Active Problem List   Diagnosis Date Noted   Family history of heart disease 11/08/2023   Mitral valve prolapse 11/08/2023   Frontal headache 05/14/2023   Encounter for general adult medical examination w/o abnormal findings 05/11/2023   BMI 27.0-27.9,adult 10/18/2021   Body mass index (BMI) of 25.0 to 25.9 in adult 10/12/2020   Subconjunctival hemorrhage of right eye 10/12/2020   Chronic renal disease, stage III (HCC) 04/11/2018   Asthma 03/01/2018   Benign hypertension with chronic kidney disease 02/26/2018   Chronic gastritis, mild.  H Pylori negative 07/31/2013   Chronic cholecystitis with calculus 07/31/2013    Past Medical History:  Diagnosis Date   Asthma    Chronic kidney disease    stage 3 a per lov dr katheryn foster 10-01-2019 on chart   Hiatal hernia    History of mitral valve prolapse    no problems since birth of son 13 yrs ago (2008)   Hydronephrosis 2021   Per patient   Hypertension    Porphyria (HCC)    Porphyria (HCC)    Shortness of breath    related to exertion    Family History  Problem Relation Age of Onset   Hypertension Mother    Arthritis Mother    Hypertension Father    Prostate cancer Father    Arthritis Father    Gout Father    Healthy  Son    Healthy Daughter    Past Surgical History:  Procedure Laterality Date   AXILLARY LYMPH NODE BIOPSY  2010   right    CHOLECYSTECTOMY     CYSTOSCOPY WITH RETROGRADE PYELOGRAM, URETEROSCOPY AND STENT PLACEMENT Right 10/15/2019   Procedure: CYSTOSCOPY WITH RETROGRADE PYELOGRAM, URETEROSCOPY AND STENT PLACEMENT, attempted laser of stricture;  Surgeon: Elisabeth Valli BIRCH, MD;  Location: [facility];  Service: Urology;  Laterality: Right;  1 HR   DILITATION & CURRETTAGE/HYSTROSCOPY WITH  NOVASURE ABLATION N/A 05/04/2016   Procedure: DILATATION & CURETTAGE/HYSTEROSCOPY WITH NOVASURE ABLATION;  Surgeon: Dickie Carder, MD;  Location: [facility];  Service: Gynecology;  Laterality: N/A;   LAPAROSCOPIC CHOLECYSTECTOMY SINGLE PORT N/A 09/10/2013   Procedure: LAPAROSCOPIC CHOLECYSTECTOMY SINGLE SITE WITH CHOLANGIOGRAM;  Surgeon: Elspeth KYM Schultze, MD;  Location: [facility];  Service: General;  Laterality: N/A;   TUBAL LIGATION     Social History   Tobacco Use   Smoking status: Never    Passive exposure: Past   Smokeless tobacco: Never  Vaping Use   Vaping status: Never Used  Substance Use Topics   Alcohol use: Yes    Comment: occasional   Drug use: No   Social History   Social History Narrative   Not on file     Immunization History  Administered Date(s) Administered   Influenza,inj,Quad PF,6+ Mos 03/01/2018, 04/08/2020, 03/27/2021   Influenza-Unspecified 03/28/2022   Janssen (J&J) SARS-COV-2 Vaccination 08/08/2019   Moderna Sars-Covid-2 Vaccination 04/01/2020   Pfizer Covid-19 Vaccine Bivalent Booster 50yrs & up 03/27/2021   Td 04/12/2003   Tdap 12/20/2010, 04/13/2021     Objective: Vital Signs: BP 130/87 (Cuff Size: Small)   Pulse (!) 56   Temp 98.1 F (36.7 C)   Resp 12   Ht 5' 4 (1.626 m)   Wt 166 lb 6.4 oz (75.5 kg)   BMI 28.56 kg/m    Physical Exam Vitals and nursing note reviewed.  Constitutional:      Appearance: She is well-developed.  HENT:     Head: Normocephalic and atraumatic.  Eyes:     Conjunctiva/sclera: Conjunctivae normal.  Cardiovascular:     Rate and Rhythm: Normal rate and regular rhythm.     Heart sounds: Normal heart sounds.  Pulmonary:     Effort: Pulmonary effort is normal.     Breath sounds: Normal breath sounds.  Abdominal:     General: Bowel sounds are normal.     Palpations: Abdomen is soft.  Musculoskeletal:     Cervical back: Normal range of motion.  Lymphadenopathy:     Cervical: No cervical adenopathy.  Skin:     General: Skin is warm and dry.     Capillary Refill: Capillary refill takes less than 2 seconds.     Comments: She had good capillary refill with no nailbed capillary changes.  Neurological:     Mental Status: She is alert and oriented to person, place, and time.  Psychiatric:        Behavior: Behavior normal.      Musculoskeletal Exam: Cervical, thoracic and lumbar spine were in good range of motion.  There was no SI joint tenderness.  Shoulder joints, elbow joints, wrist joints, MCPs, PIPs and DIPs were in good range of motion with no synovitis.  Hip joints and knee joints were in good range of motion without any warmth swelling or effusion.  There was no tenderness over ankles or MTPs.   CDAI Exam: CDAI Score: -- Patient Global: --;  Provider Global: -- Swollen: --; Tender: -- Joint Exam 02/23/2024   No joint exam has been documented for this visit   There is currently no information documented on the homunculus. Go to the Rheumatology activity and complete the homunculus joint exam.  Investigation: No additional findings.  Imaging: No results found.   Recent Labs: Lab Results  Component Value Date   WBC 4.7 05/11/2023   HGB 13.0 05/11/2023   PLT 265 05/11/2023   NA 141 02/22/2023   K 4.0 02/22/2023   CL 105 02/22/2023   CO2 27 02/22/2023   GLUCOSE 62 (L) 02/22/2023   BUN 29 (H) 02/22/2023   CREATININE 1.81 (H) 02/22/2023   BILITOT 0.5 05/11/2023   ALKPHOS 61 05/11/2023   AST 24 05/11/2023   ALT 16 05/11/2023   PROT 7.2 05/11/2023   ALBUMIN 4.3 05/11/2023   CALCIUM 9.8 02/22/2023   GFRAA 46 05/13/2021    Speciality Comments: No specialty comments available.  Procedures:  No procedures performed Allergies: Nsaids, Other, and Sulfa antibiotics   Assessment / Plan:     Visit Diagnoses: Positive ANA (antinuclear antibody) - ANA 1: 80 speckled, positive RNP,  family history of systemic lupus and rheumatoid arthritis: There is no history of fatigue, oral ulcers,  sicca symptoms, malar rash, photosensitivity, Raynaud's, lymphadenopathy or inflammatory arthritis.  No synovitis was noted on the examination.  No nailbed capillary changes were noted.  I advised her to contact us  if she develops any new symptoms.  Otherwise we will see her back in 1 year.  Chronic pain of both knees-she gives history of intermittent knee joint pain without any swelling.  No warmth swelling or effusion was noted.  Primary osteoarthritis of both feet-some discomfort especially in the morning which gets better after walking.  Proper fitting shoes were advised.  Family history of rheumatoid arthritis - Mother  Family history of systemic lupus erythematosus - Cousin  Stage 3 chronic kidney disease, unspecified whether stage 3a or 3b CKD (HCC)-followed by nephrology.  Benign hypertension with chronic kidney disease-pressure was normal at 130/87 today.  Mild intermittent asthma without complication-she gets intermittent shortness of breath for which she uses inhaler.  History of gastritis  Orders: Orders Placed This Encounter  Procedures   Protein / creatinine ratio, urine   CBC with Differential/Platelet   Comprehensive metabolic panel with GFR   ANA   Anti-DNA antibody, double-stranded   C3 and C4   Sedimentation rate   RNP Antibody   No orders of the defined types were placed in this encounter.    Follow-Up Instructions: Return in about 1 year (around 02/22/2025) for +ANA, OA .   Maya Nash, MD  Note - This record has been created using Animal nutritionist.  Chart creation errors have been sought, but may not always  have been located. Such creation errors do not reflect on  the standard of medical care.

## 2024-02-14 LAB — HM MAMMOGRAPHY

## 2024-02-23 ENCOUNTER — Encounter: Payer: Self-pay | Admitting: Rheumatology

## 2024-02-23 ENCOUNTER — Ambulatory Visit: Attending: Rheumatology | Admitting: Rheumatology

## 2024-02-23 VITALS — BP 130/87 | HR 56 | Temp 98.1°F | Resp 12 | Ht 64.0 in | Wt 166.4 lb

## 2024-02-23 DIAGNOSIS — Z8261 Family history of arthritis: Secondary | ICD-10-CM

## 2024-02-23 DIAGNOSIS — Z8269 Family history of other diseases of the musculoskeletal system and connective tissue: Secondary | ICD-10-CM

## 2024-02-23 DIAGNOSIS — M25561 Pain in right knee: Secondary | ICD-10-CM

## 2024-02-23 DIAGNOSIS — J4520 Mild intermittent asthma, uncomplicated: Secondary | ICD-10-CM

## 2024-02-23 DIAGNOSIS — M25562 Pain in left knee: Secondary | ICD-10-CM

## 2024-02-23 DIAGNOSIS — I129 Hypertensive chronic kidney disease with stage 1 through stage 4 chronic kidney disease, or unspecified chronic kidney disease: Secondary | ICD-10-CM

## 2024-02-23 DIAGNOSIS — Z8719 Personal history of other diseases of the digestive system: Secondary | ICD-10-CM

## 2024-02-23 DIAGNOSIS — M19071 Primary osteoarthritis, right ankle and foot: Secondary | ICD-10-CM

## 2024-02-23 DIAGNOSIS — G8929 Other chronic pain: Secondary | ICD-10-CM

## 2024-02-23 DIAGNOSIS — M19072 Primary osteoarthritis, left ankle and foot: Secondary | ICD-10-CM

## 2024-02-23 DIAGNOSIS — N183 Chronic kidney disease, stage 3 unspecified: Secondary | ICD-10-CM

## 2024-02-23 DIAGNOSIS — R7689 Other specified abnormal immunological findings in serum: Secondary | ICD-10-CM | POA: Diagnosis not present

## 2024-02-26 ENCOUNTER — Ambulatory Visit: Payer: Self-pay | Admitting: Rheumatology

## 2024-02-26 LAB — ANTI-DNA ANTIBODY, DOUBLE-STRANDED: ds DNA Ab: 1 [IU]/mL

## 2024-02-26 LAB — COMPREHENSIVE METABOLIC PANEL WITH GFR
AG Ratio: 1.4 (calc) (ref 1.0–2.5)
ALT: 16 U/L (ref 6–29)
AST: 18 U/L (ref 10–35)
Albumin: 4.2 g/dL (ref 3.6–5.1)
Alkaline phosphatase (APISO): 52 U/L (ref 31–125)
BUN/Creatinine Ratio: 15 (calc) (ref 6–22)
BUN: 25 mg/dL (ref 7–25)
CO2: 29 mmol/L (ref 20–32)
Calcium: 9.5 mg/dL (ref 8.6–10.2)
Chloride: 103 mmol/L (ref 98–110)
Creat: 1.64 mg/dL — ABNORMAL HIGH (ref 0.50–0.99)
Globulin: 3.1 g/dL (ref 1.9–3.7)
Glucose, Bld: 84 mg/dL (ref 65–99)
Potassium: 4.2 mmol/L (ref 3.5–5.3)
Sodium: 140 mmol/L (ref 135–146)
Total Bilirubin: 0.7 mg/dL (ref 0.2–1.2)
Total Protein: 7.3 g/dL (ref 6.1–8.1)
eGFR: 39 mL/min/1.73m2 — ABNORMAL LOW (ref >=60)

## 2024-02-26 LAB — CBC WITH DIFFERENTIAL/PLATELET
Absolute Lymphocytes: 891 {cells}/uL (ref 850–3900)
Absolute Monocytes: 378 {cells}/uL (ref 200–950)
Basophils Absolute: 18 {cells}/uL (ref 0–200)
Basophils Relative: 0.4 %
Eosinophils Absolute: 59 {cells}/uL (ref 15–500)
Eosinophils Relative: 1.3 %
HCT: 37.9 % (ref 35.0–45.0)
Hemoglobin: 12.5 g/dL (ref 11.7–15.5)
MCH: 30.4 pg (ref 27.0–33.0)
MCHC: 33 g/dL (ref 32.0–36.0)
MCV: 92.2 fL (ref 80.0–100.0)
MPV: 10.6 fL (ref 7.5–12.5)
Monocytes Relative: 8.4 %
Neutro Abs: 3155 {cells}/uL (ref 1500–7800)
Neutrophils Relative %: 70.1 %
Platelets: 262 Thousand/uL (ref 140–400)
RBC: 4.11 Million/uL (ref 3.80–5.10)
RDW: 12.5 % (ref 11.0–15.0)
Total Lymphocyte: 19.8 %
WBC: 4.5 Thousand/uL (ref 3.8–10.8)

## 2024-02-26 LAB — PROTEIN / CREATININE RATIO, URINE
Creatinine, Urine: 106 mg/dL (ref 20–275)
Protein/Creat Ratio: 57 mg/g{creat} (ref 24–184)
Protein/Creatinine Ratio: 0.057 mg/mg{creat} (ref 0.024–0.184)
Total Protein, Urine: 6 mg/dL (ref 5–24)

## 2024-02-26 LAB — SEDIMENTATION RATE: Sed Rate: 22 mm/h — ABNORMAL HIGH (ref 0–20)

## 2024-02-26 LAB — C3 AND C4
C3 Complement: 156 mg/dL (ref 83–193)
C4 Complement: 38 mg/dL (ref 15–57)

## 2024-02-26 LAB — ANA: Anti Nuclear Antibody (ANA): NEGATIVE

## 2024-02-26 LAB — RNP ANTIBODY: Ribonucleic Protein(ENA) Antibody, IgG: 1.8 AI — AB

## 2024-02-26 NOTE — Progress Notes (Signed)
  CBC normal, creatinine remains elevated and stable, sed rate mildly elevated, RNP remains positive, urine protein creatinine ratio normal, ANA negative, double-stranded ENA negative, complements normal.  Labs do not indicate an autoimmune disease flare.  Please forward results to her PCP.

## 2024-05-20 ENCOUNTER — Encounter: Payer: Self-pay | Admitting: Internal Medicine

## 2024-05-20 ENCOUNTER — Ambulatory Visit: Payer: Self-pay | Admitting: Internal Medicine

## 2024-05-20 VITALS — BP 126/82 | HR 54 | Temp 98.3°F | Ht 64.0 in | Wt 165.8 lb

## 2024-05-20 DIAGNOSIS — L819 Disorder of pigmentation, unspecified: Secondary | ICD-10-CM | POA: Diagnosis not present

## 2024-05-20 DIAGNOSIS — E559 Vitamin D deficiency, unspecified: Secondary | ICD-10-CM

## 2024-05-20 DIAGNOSIS — Z0000 Encounter for general adult medical examination without abnormal findings: Secondary | ICD-10-CM

## 2024-05-20 DIAGNOSIS — I129 Hypertensive chronic kidney disease with stage 1 through stage 4 chronic kidney disease, or unspecified chronic kidney disease: Secondary | ICD-10-CM

## 2024-05-20 DIAGNOSIS — J4520 Mild intermittent asthma, uncomplicated: Secondary | ICD-10-CM | POA: Diagnosis not present

## 2024-05-20 DIAGNOSIS — Z23 Encounter for immunization: Secondary | ICD-10-CM

## 2024-05-20 DIAGNOSIS — N1831 Chronic kidney disease, stage 3a: Secondary | ICD-10-CM | POA: Diagnosis not present

## 2024-05-20 MED ORDER — TRIAMCINOLONE ACETONIDE 0.1 % EX CREA: TOPICAL_CREAM | CUTANEOUS | Status: DC

## 2024-05-20 MED ORDER — FLUTICASONE FUROATE-VILANTEROL 200-25 MCG/ACT IN AEPB: 1.0000 | INHALATION_SPRAY | Freq: Every day | RESPIRATORY_TRACT | 2 refills | Status: AC

## 2024-05-20 NOTE — Assessment & Plan Note (Signed)
 Chronic, fair control. Goal BP <130/80.

## 2024-05-20 NOTE — Progress Notes (Unsigned)
 I,Shelby Campbell, CMA,acting as a neurosurgeon for Shelby LOISE Slocumb, MD.,have documented all relevant documentation on the behalf of Shelby LOISE Slocumb, MD,as directed by  Shelby LOISE Slocumb, MD while in the presence of Shelby LOISE Slocumb, MD.  Subjective:    Patient ID: Shelby Campbell , female    DOB: 1975/10/21 , 49 y.o.   MRN: [id number]  Chief Complaint  Patient presents with   Annual Exam    The patient is here for a physical examination. She is followed by Dr. Rutherford for her pap smears. She reports compliance with meds. Denies headaches, chest pain and shortness of breath. She notices marks on both of her legs. They look like old bite marks. She noticed this in November. She is not currently est with derm.  Letter sent to GYN for pap.    Hypertension    HPI Discussed the use of AI scribe software for clinical note transcription with the patient, who gave verbal consent to proceed.  History of Present Illness Shelby Campbell is a 49 year old female with chronic kidney disease and hypertension who presents for a routine physical exam and blood pressure check.  She continues to follow up with a nephrologist for her chronic kidney disease. Her right kidney is functioning at 20% and her left kidney at 75%. She is reducing her intake of dark soda. She is currently taking amlodipine 2.5 mg at night and metoprolol  in the morning for hypertension.  She has a complex ovarian cyst found during a gynecological evaluation. A CT scan in September revealed the cyst, and a follow-up ultrasound was done in December. She is awaiting further comparison with previous records from 2021 to determine if the cyst is new or pre-existing.  She has noticed dark marks on her legs since October or November, resembling old bites but without a known cause. The marks do not itch or become scaly, and she moisturizes daily. She uses disposable razors for shaving and changes the blade after each use.  She has asthma and uses Breo  daily, with albuterol  as needed. She takes her inhaler every morning and can tell when she misses a dose. She has a supply of albuterol  but has not needed it frequently.  She takes vitamin D  2000 IU daily, one in the morning and one in the evening. Her bowel movements occur every other day.  She has a daughter involved in travel volleyball and a son who will turn 92 this year. She plans to travel for her daughter's volleyball events and is considering a family trip for her son's birthday.   Hypertension This is a chronic problem. The current episode started more than 1 year ago. The problem has been gradually improving since onset. The problem is uncontrolled. Pertinent negatives include no blurred vision. Past treatments include beta blockers and calcium channel blockers. The current treatment provides moderate improvement. Hypertensive end-organ damage includes kidney disease.     Past Medical History:  Diagnosis Date   Asthma    Chronic kidney disease    stage 3 a per lov dr katheryn foster 10-01-2019 on chart   Hiatal hernia    History of mitral valve prolapse    no problems since birth of son 13 yrs ago (2008)   Hydronephrosis 2021   Per patient   Hypertension    Porphyria (HCC)    Porphyria (HCC)    Shortness of breath    related to exertion     Family History  Problem  Relation Age of Onset   Hypertension Mother    Arthritis Mother    Hypertension Father    Prostate cancer Father    Arthritis Father    Gout Father    Healthy Son    Healthy Daughter      Current Outpatient Medications:    acetaminophen  (TYLENOL ) 500 MG tablet, Take 1,000 mg by mouth 2 (two) times daily as needed for headache (or flank pain)., Disp: , Rfl:    albuterol  (VENTOLIN  HFA) 108 (90 Base) MCG/ACT inhaler, Inhale 2 puffs into the lungs every 6 (six) hours as needed for wheezing or shortness of breath., Disp: 54 g, Rfl: 3   amLODipine (NORVASC) 2.5 MG tablet, Take 2.5 mg by mouth  daily., Disp: , Rfl:    ascorbic acid (VITAMIN C) 250 MG CHEW, 2 chewables Orally Once a day, Disp: , Rfl:    ASHWAGANDHA PO, Take by mouth., Disp: , Rfl:    cholecalciferol (VITAMIN D3) 25 MCG (1000 UNIT) tablet, Take 1,000 Units by mouth daily. , Disp: , Rfl:    famotidine  (PEPCID ) 20 MG tablet, TAKE 1 TABLET(20 MG) BY MOUTH DAILY, Disp: 90 tablet, Rfl: 1   metoprolol  succinate (TOPROL -XL) 50 MG 24 hr tablet, TAKE 1 TABLET(50 MG) BY MOUTH DAILY, Disp: 90 tablet, Rfl: 2   montelukast  (SINGULAIR ) 10 MG tablet, TAKE 1 TABLET(10 MG) BY MOUTH DAILY, Disp: 90 tablet, Rfl: 2   Multiple Vitamin (MULTIVITAMIN) tablet, Take 1 tablet by mouth daily., Disp: , Rfl:    Multiple Vitamins-Minerals (HAIR SKIN & NAILS) TABS, 2500 mcg 2 gummies Orally one a day, Disp: , Rfl:    nystatin-triamcinolone  ointment (MYCOLOG), Apply topically as needed., Disp: , Rfl:    triamcinolone  cream (KENALOG ) 0.1 %, APPLY TO AFFECTED AREA TWICE DAILY AS NEEDED, Disp: 45 g, Rfl: `   fluticasone  furoate-vilanterol (BREO ELLIPTA ) 200-25 MCG/ACT AEPB, Inhale 1 puff into the lungs daily., Disp: 180 each, Rfl: 2   Allergies  Allergen Reactions   Nsaids     And ibuprofen cannot take due to chronic kidney disease   Other Other (See Comments)    Sulfonamides, sulfonylureas, barbiturates, antifungals, ketamine, etomidate, rifapentine, rifampicin, rifabutine, nitrofurantoin, metronidazole, ergot derivatives, indinavir, nevirapine, ritonavir, saquinavir, progestogens, progesterones, carbamazepine, phenytoin, valproate, oxycodone , pentazocine, cocaine, gold antirheumatics agents, sodium aurothiomalate, methyldopa, fenfluramine, ethosuzimide, flupentixol, flutamide, disulfiram, lidocaine     Sulfa Antibiotics Other (See Comments)    Heart beating fast       The patient states she uses bilateral tubal ligation for birth control. No LMP recorded (lmp unknown). Patient has had an ablation.. Negative for Dysmenorrhea. Negative  for: breast discharge, breast lump(s), breast pain and breast self exam. Associated symptoms include abnormal vaginal bleeding. Pertinent negatives include abnormal bleeding (hematology), anxiety, decreased libido, depression, difficulty falling sleep, dyspareunia, history of infertility, nocturia, sexual dysfunction, sleep disturbances, urinary incontinence, urinary urgency, vaginal discharge and vaginal itching. Diet regular.The patient states her exercise level is  intermittent.  . The patient's tobacco use is: Tobacco Use History[1]. She has been exposed to passive smoke. The patient's alcohol use is:  Social History   Substance and Sexual Activity  Alcohol Use Yes   Comment: occasional   Review of Systems  Constitutional: Negative.   HENT: Negative.    Eyes: Negative.  Negative for blurred vision.  Respiratory: Negative.    Cardiovascular: Negative.   Gastrointestinal: Negative.   Endocrine: Negative.   Genitourinary: Negative.   Musculoskeletal: Negative.   Skin: Negative.   Allergic/Immunologic: Negative.  Hematological: Negative.   Psychiatric/Behavioral: Negative.       Today's Vitals   05/20/24 0842  BP: 126/82  Pulse: (!) 54  Temp: 98.3 F (36.8 C)  SpO2: 98%  Weight: 165 lb 12.8 oz (75.2 kg)  Height: 5' 4 (1.626 m)   Body mass index is 28.46 kg/m.  Wt Readings from Last 3 Encounters:  05/20/24 165 lb 12.8 oz (75.2 kg)  02/23/24 166 lb 6.4 oz (75.5 kg)  11/08/23 169 lb 6.4 oz (76.8 kg)     Objective:  Physical Exam Vitals and nursing note reviewed.  Constitutional:      Appearance: Normal appearance.  HENT:     Head: Normocephalic and atraumatic.     Right Ear: Tympanic membrane, ear canal and external ear normal.     Left Ear: Tympanic membrane, ear canal and external ear normal.     Nose: Nose normal.     Mouth/Throat:     Mouth: Mucous membranes are moist.     Pharynx: Oropharynx is clear.  Eyes:     Extraocular Movements: Extraocular movements  intact.     Conjunctiva/sclera: Conjunctivae normal.     Pupils: Pupils are equal, round, and reactive to light.  Cardiovascular:     Rate and Rhythm: Normal rate and regular rhythm.     Pulses: Normal pulses.          Dorsalis pedis pulses are 2+ on the right side and 2+ on the left side.     Heart sounds: Normal heart sounds.  Pulmonary:     Effort: Pulmonary effort is normal.     Breath sounds: Normal breath sounds.  Chest:  Breasts:    Tanner Score is 5.     Right: Normal.     Left: Normal.  Abdominal:     General: Bowel sounds are normal.     Palpations: Abdomen is soft.  Genitourinary:    Comments: deferred Musculoskeletal:        General: Normal range of motion.     Cervical back: Normal range of motion and neck supple.  Skin:    General: Skin is warm and dry.  Neurological:     General: No focal deficit present.     Mental Status: She is alert and oriented to person, place, and time.  Psychiatric:        Mood and Affect: Mood normal.        Behavior: Behavior normal.         Assessment And Plan:     Encounter for general adult medical examination w/o abnormal findings Assessment & Plan: A full exam was performed.  Importance of monthly self breast exams was discussed with the patient.  She is advised to get 30-45 minutes of regular exercise, no less than four to five days per week. Both weight-bearing and aerobic exercises are recommended.  She is advised to follow a healthy diet with at least six fruits/veggies per day, decrease intake of red meat and other saturated fats and to increase fish intake to twice weekly.  Meats/fish should not be fried -- baked, boiled or broiled is preferable. It is also important to cut back on your sugar intake.  Be sure to read labels - try to avoid anything with added sugar, high fructose corn syrup or other sweeteners.  If you must use a sweetener, you can try stevia or monkfruit.  It is also important to avoid artificially sweetened  foods/beverages and diet drinks. Lastly, wear SPF 50  sunscreen on exposed skin and when in direct sunlight for an extended period of time.  Be sure to avoid fast food restaurants and aim for at least 60 ounces of water daily.      Orders: -     CBC -     CMP14+EGFR -     Lipid panel  Benign hypertension with chronic kidney disease Assessment & Plan: Chronic, fair control. Goal BP <130/80.  Orders: -     POCT urinalysis dipstick -     Microalbumin / creatinine urine ratio -     EKG 12-Lead  Stage 3a chronic kidney disease (HCC) Assessment & Plan: Under nephrology care, taking vitamin D  2000 units. 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 Recent labs pending. - Obtain recent lab results from nephrologist. - Chronic, she is encouraged to stay well hydrated, avoid NSAIDs and keep BP controlled to prevent progression of CKD.     Mild intermittent asthma without complication -     Fluticasone  Furoate-Vilanterol; Inhale 1 puff into the lungs daily.  Dispense: 180 each; Refill: 2  Hyperpigmentation  Vitamin D  deficiency disease -     VITAMIN D  25 Hydroxy (Vit-D Deficiency, Fractures)  Immunization due -     Flu vaccine trivalent PF, 6mos and older(Flulaval,Afluria,Fluarix,Fluzone)  Other orders -     Triamcinolone  Acetonide; APPLY TO AFFECTED AREA TWICE DAILY AS NEEDED  Dispense: 45 g; Refill: `   Assessment & Plan Benign hypertension with stage 3a chronic kidney disease Blood pressure managed with amlodipine and metoprolol . Right kidney function at 20%, left compensating at 75%. - Continue amlodipine 2.5 mg and metoprolol . - Encouraged reduction of dark soda intake.  Mild intermittent asthma Asthma well-controlled with Breo inhaler. Albuterol  rarely needed. - Refilled Breo inhaler. - Ensure albuterol  inhaler is refilled when current supply is exhausted.  Hyperpigmentation Dark marks on legs, possibly from previous bruising or irritation. No itching or  scaling. - Prescribed triamcinolone  cream. - Referred to dermatologist for further evaluation.  Vitamin D  deficiency Taking 2000 IU of vitamin D  daily. - Continue vitamin D  supplementation at 2000 IU daily.  General health maintenance Routine health maintenance discussed, including exercise and Pap smear results. - Requested Pap smear results from Dr. JAYSON. - Encouraged regular exercise, possibly utilizing [employer].  Return for 1 YEAR HM, 4 MONTH BPC. Patient was given opportunity to ask questions. Patient verbalized understanding of the plan and was able to repeat key elements of the plan. All questions were answered to their satisfaction.   I, Shelby LOISE Slocumb, MD, have reviewed all documentation for this visit. The documentation on 05/20/2024 for the exam, diagnosis, procedures, and orders are all accurate and complete.       [1] Social History Tobacco Use  Smoking Status Never   Passive exposure: Past  Smokeless Tobacco Never

## 2024-05-20 NOTE — Patient Instructions (Signed)

## 2024-05-20 NOTE — Assessment & Plan Note (Signed)
 Under nephrology care, taking vitamin D  2000 units. 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 Recent labs pending. - Obtain recent lab results from nephrologist. - Chronic, she is encouraged to stay well hydrated, avoid NSAIDs and keep BP controlled to prevent progression of CKD.

## 2024-05-20 NOTE — Assessment & Plan Note (Signed)

## 2024-05-21 ENCOUNTER — Other Ambulatory Visit: Payer: Self-pay

## 2024-05-21 LAB — CMP14+EGFR
ALT: 13 IU/L (ref 0–32)
AST: 18 IU/L (ref 0–40)
Albumin: 4.4 g/dL (ref 3.9–4.9)
Alkaline Phosphatase: 54 IU/L (ref 41–116)
BUN/Creatinine Ratio: 15 (ref 9–23)
BUN: 29 mg/dL — ABNORMAL HIGH (ref 6–24)
Bilirubin Total: 0.6 mg/dL (ref 0.0–1.2)
CO2: 23 mmol/L (ref 20–29)
Calcium: 9.5 mg/dL (ref 8.7–10.2)
Chloride: 100 mmol/L (ref 96–106)
Creatinine, Ser: 1.9 mg/dL — ABNORMAL HIGH (ref 0.57–1.00)
Globulin, Total: 2.9 g/dL (ref 1.5–4.5)
Glucose: 85 mg/dL (ref 70–99)
Potassium: 4.3 mmol/L (ref 3.5–5.2)
Sodium: 137 mmol/L (ref 134–144)
Total Protein: 7.3 g/dL (ref 6.0–8.5)
eGFR: 33 mL/min/1.73 — ABNORMAL LOW

## 2024-05-21 LAB — LIPID PANEL
Chol/HDL Ratio: 2.8 ratio (ref 0.0–4.4)
Cholesterol, Total: 164 mg/dL (ref 100–199)
HDL: 59 mg/dL
LDL Chol Calc (NIH): 86 mg/dL (ref 0–99)
Triglycerides: 108 mg/dL (ref 0–149)
VLDL Cholesterol Cal: 19 mg/dL (ref 5–40)

## 2024-05-21 LAB — CBC
Hematocrit: 38.2 % (ref 34.0–46.6)
Hemoglobin: 12.5 g/dL (ref 11.1–15.9)
MCH: 30.9 pg (ref 26.6–33.0)
MCHC: 32.7 g/dL (ref 31.5–35.7)
MCV: 95 fL (ref 79–97)
Platelets: 259 x10E3/uL (ref 150–450)
RBC: 4.04 x10E6/uL (ref 3.77–5.28)
RDW: 13 % (ref 11.7–15.4)
WBC: 6 x10E3/uL (ref 3.4–10.8)

## 2024-05-21 LAB — MICROALBUMIN / CREATININE URINE RATIO
Creatinine, Urine: 133.3 mg/dL
Microalb/Creat Ratio: 4 mg/g{creat} (ref 0–29)
Microalbumin, Urine: 5.7 ug/mL

## 2024-05-21 LAB — VITAMIN D 25 HYDROXY (VIT D DEFICIENCY, FRACTURES): Vit D, 25-Hydroxy: 51.2 ng/mL (ref 30.0–100.0)

## 2024-05-21 MED ORDER — TRIAMCINOLONE ACETONIDE 0.1 % EX CREA: TOPICAL_CREAM | CUTANEOUS | Status: DC

## 2024-05-21 MED ORDER — TRIAMCINOLONE ACETONIDE 0.1 % EX CREA: TOPICAL_CREAM | CUTANEOUS | 1 refills | Status: AC

## 2024-05-21 NOTE — Progress Notes (Unsigned)
 I,Dorrell Mitcheltree T Emmitt, CMA,acting as a neurosurgeon for Karinna Beadles T Tristan Bramble, CMA.,have documented all relevant documentation on the behalf of Richerd ONEIDA Emmitt, [place] directed by  Richerd ONEIDA Emmitt, CMA while in the presence of Richerd ONEIDA Emmitt, [place].  Subjective:  Patient ID: Shelby Campbell , female    DOB: Sep 11, 1975 , 49 y.o.   MRN: [id number]  No chief complaint on file.   HPI  HPI   Past Medical History:  Diagnosis Date   Asthma    Chronic kidney disease    stage 3 a per lov dr katheryn foster 10-01-2019 on chart   Hiatal hernia    History of mitral valve prolapse    no problems since birth of son 13 yrs ago (2008)   Hydronephrosis 2021   Per patient   Hypertension    Porphyria (HCC)    Porphyria (HCC)    Shortness of breath    related to exertion     Family History  Problem Relation Age of Onset   Hypertension Mother    Arthritis Mother    Hypertension Father    Prostate cancer Father    Arthritis Father    Gout Father    Healthy Son    Healthy Daughter     Current Medications[1]   Allergies[2]   Review of Systems   There were no vitals filed for this visit. There is no height or weight on file to calculate BMI.  Wt Readings from Last 3 Encounters:  05/20/24 165 lb 12.8 oz (75.2 kg)  02/23/24 166 lb 6.4 oz (75.5 kg)  11/08/23 169 lb 6.4 oz (76.8 kg)     Objective:  Physical Exam      Assessment And Plan:  There are no diagnoses linked to this encounter.   No follow-ups on file.  Patient was given opportunity to ask questions. Patient verbalized understanding of the plan and was able to repeat key elements of the plan. All questions were answered to their satisfaction.  Richerd ONEIDA Emmitt, CMA  I, Richerd ONEIDA Emmitt, CMA, have reviewed all documentation for this visit. The documentation on 05/21/2024 for the exam, diagnosis, procedures, and orders are all accurate and complete.   IF YOU HAVE BEEN REFERRED TO A SPECIALIST, IT MAY TAKE 1-2 WEEKS TO  SCHEDULE/PROCESS THE REFERRAL. IF YOU HAVE NOT HEARD FROM US /SPECIALIST IN TWO WEEKS, PLEASE GIVE US  A CALL AT [phone].   THE PATIENT IS ENCOURAGED TO PRACTICE SOCIAL DISTANCING DUE TO THE COVID-19 PANDEMIC.       [1]  Current Outpatient Medications:    acetaminophen  (TYLENOL ) 500 MG tablet, Take 1,000 mg by mouth 2 (two) times daily as needed for headache (or flank pain)., Disp: , Rfl:    albuterol  (VENTOLIN  HFA) 108 (90 Base) MCG/ACT inhaler, Inhale 2 puffs into the lungs every 6 (six) hours as needed for wheezing or shortness of breath., Disp: 54 g, Rfl: 3   amLODipine (NORVASC) 2.5 MG tablet, Take 2.5 mg by mouth daily., Disp: , Rfl:    ascorbic acid (VITAMIN C) 250 MG CHEW, 2 chewables Orally Once a day, Disp: , Rfl:    ASHWAGANDHA PO, Take by mouth., Disp: , Rfl:    cholecalciferol (VITAMIN D3) 25 MCG (1000 UNIT) tablet, Take 1,000 Units by mouth daily. , Disp: , Rfl:    famotidine  (PEPCID ) 20 MG tablet, TAKE 1 TABLET(20 MG) BY MOUTH DAILY, Disp: 90 tablet, Rfl: 1   fluticasone  furoate-vilanterol (BREO ELLIPTA ) 200-25 MCG/ACT AEPB, Inhale 1  puff into the lungs daily., Disp: 180 each, Rfl: 2   metoprolol  succinate (TOPROL -XL) 50 MG 24 hr tablet, TAKE 1 TABLET(50 MG) BY MOUTH DAILY, Disp: 90 tablet, Rfl: 2   montelukast  (SINGULAIR ) 10 MG tablet, TAKE 1 TABLET(10 MG) BY MOUTH DAILY, Disp: 90 tablet, Rfl: 2   Multiple Vitamin (MULTIVITAMIN) tablet, Take 1 tablet by mouth daily., Disp: , Rfl:    Multiple Vitamins-Minerals (HAIR SKIN & NAILS) TABS, 2500 mcg 2 gummies Orally one a day, Disp: , Rfl:    nystatin-triamcinolone  ointment (MYCOLOG), Apply topically as needed., Disp: , Rfl:    triamcinolone  cream (KENALOG ) 0.1 %, APPLY TO AFFECTED AREA TWICE DAILY AS NEEDED, Disp: 45 g, Rfl: ` [2]  Allergies Allergen Reactions   Nsaids     And ibuprofen cannot take due to chronic kidney disease   Other Other (See Comments)    Sulfonamides, sulfonylureas, barbiturates, antifungals,  ketamine, etomidate, rifapentine, rifampicin, rifabutine, nitrofurantoin, metronidazole, ergot derivatives, indinavir, nevirapine, ritonavir, saquinavir, progestogens, progesterones, carbamazepine, phenytoin, valproate, oxycodone , pentazocine, cocaine, gold antirheumatics agents, sodium aurothiomalate, methyldopa, fenfluramine, ethosuzimide, flupentixol, flutamide, disulfiram, lidocaine     Sulfa Antibiotics Other (See Comments)    Heart beating fast

## 2024-05-23 ENCOUNTER — Ambulatory Visit: Payer: Self-pay | Admitting: Internal Medicine

## 2024-05-23 DIAGNOSIS — L819 Disorder of pigmentation, unspecified: Secondary | ICD-10-CM | POA: Insufficient documentation

## 2024-05-23 DIAGNOSIS — E559 Vitamin D deficiency, unspecified: Secondary | ICD-10-CM | POA: Insufficient documentation

## 2024-05-23 NOTE — Assessment & Plan Note (Signed)
 Dark marks on legs, possibly from previous bruising or irritation. No itching or scaling. - Prescribed triamcinolone  cream. - Referred to dermatologist for further evaluation.

## 2024-05-23 NOTE — Assessment & Plan Note (Signed)
 Chronic. Asthma well-controlled with Breo inhaler. Albuterol  rarely needed. - Refilled Breo inhaler. - Ensure albuterol  inhaler is refilled when current supply is exhausted. - Avoid known triggers.

## 2024-05-23 NOTE — Assessment & Plan Note (Signed)
 Taking 2000 IU of vitamin D  daily. - Continue vitamin D  supplementation at 2000 IU daily.

## 2024-10-02 ENCOUNTER — Ambulatory Visit: Payer: Self-pay | Admitting: Internal Medicine

## 2025-02-05 ENCOUNTER — Ambulatory Visit: Admitting: Physician Assistant

## 2025-02-25 ENCOUNTER — Ambulatory Visit: Admitting: Rheumatology

## 2025-05-22 ENCOUNTER — Encounter: Payer: Self-pay | Admitting: Internal Medicine
